# Patient Record
Sex: Male | Born: 1960 | Race: Black or African American | Hispanic: No | Smoking: Never smoker
Health system: Southern US, Community
[De-identification: ages and names within clinical notes are randomized; demographics above are authoritative.]

## PROBLEM LIST (undated history)

## (undated) DIAGNOSIS — B182 Chronic viral hepatitis C: Secondary | ICD-10-CM

## (undated) DIAGNOSIS — B9681 Helicobacter pylori [H. pylori] as the cause of diseases classified elsewhere: Secondary | ICD-10-CM

## (undated) DIAGNOSIS — K2970 Gastritis, unspecified, without bleeding: Secondary | ICD-10-CM

## (undated) DIAGNOSIS — M1990 Unspecified osteoarthritis, unspecified site: Secondary | ICD-10-CM

## (undated) DIAGNOSIS — I10 Essential (primary) hypertension: Secondary | ICD-10-CM

## (undated) DIAGNOSIS — M75100 Unspecified rotator cuff tear or rupture of unspecified shoulder, not specified as traumatic: Secondary | ICD-10-CM

## (undated) DIAGNOSIS — R52 Pain, unspecified: Secondary | ICD-10-CM

## (undated) DIAGNOSIS — M109 Gout, unspecified: Secondary | ICD-10-CM

## (undated) HISTORY — PX: KNEE SURGERY: SHX244

## (undated) HISTORY — DX: Helicobacter pylori (H. pylori) as the cause of diseases classified elsewhere: B96.81

## (undated) HISTORY — DX: Unspecified rotator cuff tear or rupture of unspecified shoulder, not specified as traumatic: M75.100

## (undated) HISTORY — DX: Gastritis, unspecified, without bleeding: K29.70

## (undated) HISTORY — DX: Chronic viral hepatitis C: B18.2

---

## 2006-10-10 ENCOUNTER — Emergency Department (HOSPITAL_COMMUNITY): Admission: EM | Admit: 2006-10-10 | Discharge: 2006-10-10 | Payer: Self-pay | Admitting: Emergency Medicine

## 2009-07-20 ENCOUNTER — Emergency Department (HOSPITAL_COMMUNITY): Admission: EM | Admit: 2009-07-20 | Discharge: 2009-07-20 | Payer: Self-pay | Admitting: Emergency Medicine
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
## 2009-07-30 ENCOUNTER — Emergency Department (HOSPITAL_COMMUNITY): Admission: EM | Admit: 2009-07-30 | Discharge: 2009-07-30 | Payer: Self-pay | Admitting: Emergency Medicine
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
## 2009-08-02 ENCOUNTER — Emergency Department (HOSPITAL_COMMUNITY): Admission: EM | Admit: 2009-08-02 | Discharge: 2009-08-02 | Payer: Self-pay | Admitting: Emergency Medicine

## 2010-12-30 ENCOUNTER — Emergency Department (HOSPITAL_COMMUNITY): Payer: No Typology Code available for payment source

## 2010-12-30 ENCOUNTER — Emergency Department (HOSPITAL_COMMUNITY)
Admission: EM | Admit: 2010-12-30 | Discharge: 2010-12-30 | Disposition: A | Discharge status: Home / Self Care | Payer: No Typology Code available for payment source | Attending: Emergency Medicine | Admitting: Emergency Medicine

## 2010-12-30 DIAGNOSIS — M545 Low back pain, unspecified: Secondary | ICD-10-CM | POA: Insufficient documentation

## 2010-12-30 DIAGNOSIS — M542 Cervicalgia: Secondary | ICD-10-CM | POA: Insufficient documentation

## 2010-12-30 DIAGNOSIS — M546 Pain in thoracic spine: Secondary | ICD-10-CM | POA: Insufficient documentation

## 2010-12-30 DIAGNOSIS — R079 Chest pain, unspecified: Secondary | ICD-10-CM | POA: Insufficient documentation

## 2010-12-30 DIAGNOSIS — S0083XA Contusion of other part of head, initial encounter: Secondary | ICD-10-CM | POA: Insufficient documentation

## 2010-12-30 DIAGNOSIS — S139XXA Sprain of joints and ligaments of unspecified parts of neck, initial encounter: Secondary | ICD-10-CM | POA: Insufficient documentation

## 2010-12-30 DIAGNOSIS — I10 Essential (primary) hypertension: Secondary | ICD-10-CM | POA: Insufficient documentation

## 2010-12-30 DIAGNOSIS — S0003XA Contusion of scalp, initial encounter: Secondary | ICD-10-CM | POA: Insufficient documentation

## 2010-12-30 DIAGNOSIS — S0180XA Unspecified open wound of other part of head, initial encounter: Secondary | ICD-10-CM | POA: Insufficient documentation

## 2010-12-30 DIAGNOSIS — IMO0002 Reserved for concepts with insufficient information to code with codable children: Secondary | ICD-10-CM | POA: Insufficient documentation
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
## 2011-01-02 ENCOUNTER — Other Ambulatory Visit (HOSPITAL_COMMUNITY): Payer: Self-pay | Admitting: Emergency Medicine

## 2011-01-02 ENCOUNTER — Emergency Department (HOSPITAL_COMMUNITY)
Admission: EM | Admit: 2011-01-02 | Discharge: 2011-01-02 | Disposition: A | Discharge status: Home / Self Care | Payer: No Typology Code available for payment source | Attending: Emergency Medicine | Admitting: Emergency Medicine

## 2011-01-02 ENCOUNTER — Ambulatory Visit (HOSPITAL_COMMUNITY)
Admission: RE | Admit: 2011-01-02 | Discharge: 2011-01-02 | Disposition: A | Discharge status: Home / Self Care | Payer: No Typology Code available for payment source | Source: Ambulatory Visit | Attending: Emergency Medicine | Admitting: Emergency Medicine

## 2011-01-02 DIAGNOSIS — I10 Essential (primary) hypertension: Secondary | ICD-10-CM | POA: Insufficient documentation

## 2011-01-02 DIAGNOSIS — M25569 Pain in unspecified knee: Secondary | ICD-10-CM
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
## 2013-09-15 ENCOUNTER — Emergency Department (HOSPITAL_COMMUNITY)
Admission: EM | Admit: 2013-09-15 | Discharge: 2013-09-15 | Disposition: A | Discharge status: Home / Self Care | Payer: No Typology Code available for payment source | Attending: Emergency Medicine | Admitting: Emergency Medicine

## 2013-09-15 ENCOUNTER — Encounter (HOSPITAL_COMMUNITY): Payer: Self-pay | Admitting: Emergency Medicine

## 2013-09-15 DIAGNOSIS — M109 Gout, unspecified: Secondary | ICD-10-CM

## 2013-09-15 HISTORY — DX: Gout, unspecified: M10.9

## 2013-09-15 MED ORDER — PREDNISONE 10 MG PO TABS
60.0000 mg | ORAL_TABLET | Freq: Once | ORAL | Status: AC
  Administered 2013-09-15: 60 mg via ORAL
  Filled 2013-09-15 (×2): qty 1

## 2013-09-15 MED ORDER — HYDROCODONE-ACETAMINOPHEN 5-325 MG PO TABS: 1.0000 | ORAL_TABLET | ORAL | Status: DC | PRN

## 2013-09-15 MED ORDER — PREDNISONE 10 MG PO TABS: ORAL_TABLET | ORAL | Status: DC

## 2013-09-15 MED ORDER — DICLOFENAC SODIUM 75 MG PO TBEC: 75.0000 mg | DELAYED_RELEASE_TABLET | Freq: Two times a day (BID) | ORAL | Status: DC

## 2013-09-15 MED ORDER — ONDANSETRON HCL 4 MG PO TABS
4.0000 mg | ORAL_TABLET | Freq: Once | ORAL | Status: AC
  Administered 2013-09-15: 4 mg via ORAL
  Filled 2013-09-15: qty 1

## 2013-09-15 MED ORDER — HYDROCODONE-ACETAMINOPHEN 5-325 MG PO TABS
2.0000 | ORAL_TABLET | Freq: Once | ORAL | Status: AC
  Administered 2013-09-15: 2 via ORAL
  Filled 2013-09-15: qty 2

## 2013-09-15 MED ORDER — KETOROLAC TROMETHAMINE 10 MG PO TABS
10.0000 mg | ORAL_TABLET | Freq: Once | ORAL | Status: AC
  Administered 2013-09-15: 10 mg via ORAL
  Filled 2013-09-15: qty 1

## 2013-09-15 NOTE — ED Provider Notes (Signed)
Medical screening examination/treatment/procedure(s) were performed by non-physician practitioner and as supervising physician I was immediately available for consultation/collaboration.  EKG Interpretation   None         Maudry Diego, MD 09/15/13 2235

## 2013-09-15 NOTE — ED Provider Notes (Signed)
CSN: [id number]     Arrival date & time 09/15/13  1846 History   First MD Initiated Contact with Patient 09/15/13 2038     Chief Complaint  Patient presents with  . Foot Pain   (Consider location/radiation/quality/duration/timing/severity/associated sxs/prior Treatment) HPI Comments: Patient is a 53 year old male who presents to the emergency department with complaint of right ankle pain. The patient states his pain started on last evening. He denies any recent injury. He has not had any recent operations or procedures involving the right ankle. It is of note that the patient has been diagnosed with gout in the past. The patient states that his entire family seems to have it, from grandfather to his current generation. Patient states the pain is getting progressively worse, he has tried to stay off of the foot and ankle today but rest and elevation have not helped the pain.  Patient is a 53 y.o. male presenting with lower extremity pain. The history is provided by the patient.  Foot Pain Associated symptoms include arthralgias. Pertinent negatives include no abdominal pain, chest pain, coughing or neck pain.    Past Medical History  Diagnosis Date  . Gout    Past Surgical History  Procedure Laterality Date  . Knee surgery      x4   History reviewed. No pertinent family history. History  Substance Use Topics  . Smoking status: Never Smoker   . Smokeless tobacco: Not on file  . Alcohol Use: Yes    Review of Systems  Constitutional: Negative for activity change.       All ROS Neg except as noted in HPI  HENT: Negative for nosebleeds.   Eyes: Negative for photophobia and discharge.  Respiratory: Negative for cough, shortness of breath and wheezing.   Cardiovascular: Negative for chest pain and palpitations.  Gastrointestinal: Negative for abdominal pain and blood in stool.  Genitourinary: Negative for dysuria, frequency and hematuria.  Musculoskeletal: Positive for arthralgias.  Negative for back pain and neck pain.  Skin: Negative.   Neurological: Negative for dizziness, seizures and speech difficulty.  Psychiatric/Behavioral: Negative for hallucinations and confusion.    Allergies  Review of patient's allergies indicates no known allergies.  Home Medications  No current outpatient prescriptions on file. BP 140/79  Pulse 111  Temp(Src) 99.6 F (37.6 C) (Oral)  Resp 20  Ht 6\' 2"  (1.88 m)  Wt 245 lb (111.131 kg)  BMI 31.44 kg/m2  SpO2 98% Physical Exam  Nursing note and vitals reviewed. Constitutional: He is oriented to person, place, and time. He appears well-developed and well-nourished.  Non-toxic appearance.  HENT:  Head: Normocephalic.  Right Ear: Tympanic membrane and external ear normal.  Left Ear: Tympanic membrane and external ear normal.  Eyes: EOM and lids are normal. Pupils are equal, round, and reactive to light.  Neck: Normal range of motion. Neck supple. Carotid bruit is not present.  Cardiovascular: Normal rate, regular rhythm, normal heart sounds, intact distal pulses and normal pulses.   Pulmonary/Chest: Breath sounds normal. No respiratory distress.  Abdominal: Soft. Bowel sounds are normal. There is no tenderness. There is no guarding.  Musculoskeletal: Normal range of motion.  There is mild-to-moderate swelling of the right ankle. There is some increased redness and warmth of the ankle as well. There is good range of motion of the right hip and knee. There is full range of motion of the toes of the right foot. There no lesions between the toes. There no lesions of the plantar surface of  the right foot. The Achilles tendon is intact.  Lymphadenopathy:       Head (right side): No submandibular adenopathy present.       Head (left side): No submandibular adenopathy present.    He has no cervical adenopathy.  Neurological: He is alert and oriented to person, place, and time. He has normal strength. No cranial nerve deficit or sensory  deficit.  Skin: Skin is warm and dry.  Psychiatric: He has a normal mood and affect. His speech is normal.    ED Course  Procedures (including critical care time) Labs Review Labs Reviewed - No data to display Imaging Review No results found.  EKG Interpretation   None       MDM  No diagnosis found. **I have reviewed nursing notes, vital signs, and all appropriate lab and imaging results for this patient.*  Patient states he has a history of gout. He also has a family history of gout. He has a flare of pain in his right ankle which he believes to be gout related. The ankle is warm but not hot. I do not find evidence of a infected joint. There no lesions noted about the foot or ankle to contribute to the idea of infection. The patient has not had injury to the area to consider trauma.  Prescription for Norco, prednisone, and diclofenac given to the patient. Patient does not have a primary physician, he is referred to the adult medicine clinic until he can establish a primary physician.  Lenox Ahr, PA-C 09/15/13 2053

## 2013-09-15 NOTE — Discharge Instructions (Signed)
Please see one of the physicians at the adult medicine clinic to assist with your medical problems until you establish your primary care physician. Please use medications as suggested. Norco may cause drowsiness, please use with caution. Please keep your foot elevated as much as possible and warm as possible. Gout Gout is when your joints become red, sore, and swell (inflammed). This is caused by the buildup of uric acid crystals in the joints. Uric acid is a chemical that is normally in the blood. If the level of uric acid gets too high in the blood, these crystals form in your joints and tissues. Over time, these crystals can form into masses near the joints and tissues. These masses can destroy bone and cause the bone to look misshapen (deformed). HOME CARE   Do not take aspirin for pain.  Only take medicine as told by your doctor.  Rest the joint as much as you can. When in bed, keep sheets and blankets off painful areas.  Keep the sore joints raised (elevated).  Put warm or cold packs on painful joints. Use of warm or cold packs depends on which works best for you.  Use crutches if the painful joint is in your leg.  Drink enough fluids to keep your pee (urine) clear or pale yellow. Limit alcohol, sugary drinks, and drinks with fructose in them.  Follow your diet instructions. Pay careful attention to how much protein you eat. Include fruits, vegetables, whole grains, and fat-free or low-fat milk products in your daily diet. Talk to your doctor or dietician about the use of coffee, vitamin C, and cherries. These may help lower uric acid levels.  Keep a healthy body weight. GET HELP RIGHT AWAY IF:   You have watery poop (diarrhea), throw up (vomit), or have any side effects from medicines.  You do not feel better in 24 hours, or you are getting worse.  Your joint becomes suddenly more tender, and you have chills or a fever. MAKE SURE YOU:   Understand these instructions.  Will  watch your condition.  Will get help right away if you are not doing well or get worse. Document Released: 05/09/2008 Document Revised: 11/25/2012 Document Reviewed: 11/08/2009 [facility] Patient Information 2014 [place].  Purine Restricted Diet A low-purine diet consists of foods that reduce uric acid made in your body. INDICATIONS FOR USE  Your caregiver may ask you to follow a low-purine diet to reduce gout flairs.  GUIDELINES  Avoid high-purine foods, including all alcohol, yeast extracts taken as supplements, and sauces made from meats (like gravy). Do not eat high-purine meats, including anchovies, sardines, herring, mussels, tuna, codfish, scallops, trout, haddock, bacon, organ meats, tripe, goose, wild game, and sweetbreads.  Grains  Allowed/Recommended: All, except those listed to consume in moderation.  Consume in Moderation: Oatmeal ( cup uncooked daily), wheat bran or germ ( cup daily), and whole grains. Vegetables  Allowed/Recommended: All, except those listed to consume in moderation.  Consume in Moderation: Asparagus, cauliflower, spinach, mushrooms, and green peas ( cup daily). Fruit  Allowed/Recommended: All.  Consume in Moderation: None. Meat and Meat Substitutes  Allowed/Recommended: Eggs, nuts, and peanut butter.  Consume in Moderation: Limit to 4 to 6 oz daily. Avoid high-purine meats. Lentils, peas, and dried beans (1 cup daily). Milk  Allowed/Recommended: All. Choose low-fat or skim when possible.  Consume in Moderation: None. Fats and Oils  Allowed/Recommended: All.  Consume in Moderation: None. Beverages  Allowed/Recommended: All, except those listed to avoid.  Avoid:  All alcohol. Condiments/Miscellaneous  Allowed/Recommended: All, except those listed to consume in moderation.  Consume in Moderation: Bouillon and meat-based broths and soups. Document Released: 11/25/2010 Document Revised: 10/23/2011 Document Reviewed:  11/25/2010 [facility] Patient Information 2014 [place].

## 2013-09-15 NOTE — ED Notes (Signed)
Pt c/o rt ankle pain since last night. Pt denies any injury but states he has a hx of gout.

## 2014-12-25 ENCOUNTER — Emergency Department (HOSPITAL_COMMUNITY)
Admission: EM | Admit: 2014-12-25 | Discharge: 2014-12-25 | Disposition: A | Discharge status: Home / Self Care | Payer: Self-pay | Attending: Emergency Medicine | Admitting: Emergency Medicine

## 2014-12-25 ENCOUNTER — Encounter (HOSPITAL_COMMUNITY): Payer: Self-pay | Admitting: Emergency Medicine

## 2014-12-25 DIAGNOSIS — Z7952 Long term (current) use of systemic steroids: Secondary | ICD-10-CM | POA: Insufficient documentation

## 2014-12-25 DIAGNOSIS — Z791 Long term (current) use of non-steroidal anti-inflammatories (NSAID): Secondary | ICD-10-CM | POA: Insufficient documentation

## 2014-12-25 DIAGNOSIS — M1000 Idiopathic gout, unspecified site: Secondary | ICD-10-CM | POA: Insufficient documentation

## 2014-12-25 MED ORDER — COLCHICINE 0.6 MG PO TABS
0.6000 mg | ORAL_TABLET | Freq: Once | ORAL | Status: AC
  Administered 2014-12-25: 0.6 mg via ORAL
  Filled 2014-12-25: qty 1

## 2014-12-25 MED ORDER — DICLOFENAC SODIUM 75 MG PO TBEC: 75.0000 mg | DELAYED_RELEASE_TABLET | Freq: Two times a day (BID) | ORAL | Status: DC

## 2014-12-25 MED ORDER — DEXAMETHASONE 4 MG PO TABS: 4.0000 mg | ORAL_TABLET | Freq: Two times a day (BID) | ORAL | Status: DC

## 2014-12-25 MED ORDER — DEXAMETHASONE SODIUM PHOSPHATE 4 MG/ML IJ SOLN
8.0000 mg | Freq: Once | INTRAMUSCULAR | Status: AC
  Administered 2014-12-25: 8 mg via INTRAMUSCULAR
  Filled 2014-12-25: qty 2

## 2014-12-25 MED ORDER — ACETAMINOPHEN-CODEINE #3 300-30 MG PO TABS: 1.0000 | ORAL_TABLET | Freq: Four times a day (QID) | ORAL | Status: DC | PRN

## 2014-12-25 MED ORDER — KETOROLAC TROMETHAMINE 10 MG PO TABS
10.0000 mg | ORAL_TABLET | Freq: Once | ORAL | Status: AC
  Administered 2014-12-25: 10 mg via ORAL
  Filled 2014-12-25: qty 1

## 2014-12-25 NOTE — ED Notes (Signed)
Reviewed discharge instructions and meds. States understanding.  RGK

## 2014-12-25 NOTE — Discharge Instructions (Signed)
Please use Decadron and diclofenac daily with food. May use Tylenol codeine for pain if needed. Please take all these medications with food. Please call [facility] here in [place] area, or the [facility] in [place] to establish her primary physician to help manage your gout attacks. Gout Gout is when your joints become red, sore, and swell (inflamed). This is caused by the buildup of uric acid crystals in the joints. Uric acid is a chemical that is normally in the blood. If the level of uric acid gets too high in the blood, these crystals form in your joints and tissues. Over time, these crystals can form into masses near the joints and tissues. These masses can destroy bone and cause the bone to look misshapen (deformed). HOME CARE   Do not take aspirin for pain.  Only take medicine as told by your doctor.  Rest the joint as much as you can. When in bed, keep sheets and blankets off painful areas.  Keep the sore joints raised (elevated).  Put warm or cold packs on painful joints. Use of warm or cold packs depends on which works best for you.  Use crutches if the painful joint is in your leg.  Drink enough fluids to keep your pee (urine) clear or pale yellow. Limit alcohol, sugary drinks, and drinks with fructose in them.  Follow your diet instructions. Pay careful attention to how much protein you eat. Include fruits, vegetables, whole grains, and fat-free or low-fat milk products in your daily diet. Talk to your doctor or dietitian about the use of coffee, vitamin C, and cherries. These may help lower uric acid levels.  Keep a healthy body weight. GET HELP RIGHT AWAY IF:   You have watery poop (diarrhea), throw up (vomit), or have any side effects from medicines.  You do not feel better in 24 hours, or you are getting worse.  Your joint becomes suddenly more tender, and you have chills or a fever. MAKE SURE YOU:   Understand these instructions.  Will watch  your condition.  Will get help right away if you are not doing well or get worse. Document Released: 05/09/2008 Document Revised: 12/15/2013 Document Reviewed: 03/13/2012 [facility] Patient Information 2015 [place], [place]. This information is not intended to replace advice given to you by your health care provider. Make sure you discuss any questions you have with your health care provider.

## 2014-12-25 NOTE — ED Provider Notes (Signed)
CSN: [id number]     Arrival date & time 12/25/14  0840 History   First MD Initiated Contact with Patient 12/25/14 [phone]     Chief Complaint  Patient presents with  . Gout     (Consider location/radiation/quality/duration/timing/severity/associated sxs/prior Treatment) Patient is a 54 y.o. male presenting with lower extremity pain. The history is provided by the patient.  Foot Pain This is a chronic problem. The current episode started yesterday. The problem occurs intermittently. The problem has been gradually worsening. Associated symptoms include arthralgias. Pertinent negatives include no weakness. The symptoms are aggravated by standing and walking. He has tried nothing for the symptoms. The treatment provided no relief.    Past Medical History  Diagnosis Date  . Gout    Past Surgical History  Procedure Laterality Date  . Knee surgery      x4   Family History  Problem Relation Age of Onset  . Alcohol abuse Father    History  Substance Use Topics  . Smoking status: Never Smoker   . Smokeless tobacco: Never Used  . Alcohol Use: 4.2 oz/week    7 Cans of beer per week    Review of Systems  Musculoskeletal: Positive for arthralgias.  Neurological: Negative for weakness.  All other systems reviewed and are negative.     Allergies  Review of patient's allergies indicates no known allergies.  Home Medications   Prior to Admission medications   Medication Sig Start Date End Date Taking? Authorizing Provider  diclofenac (VOLTAREN) 75 MG EC tablet Take 1 tablet (75 mg total) by mouth 2 (two) times daily. 09/15/13   Lily Kocher, PA-C  HYDROcodone-acetaminophen (NORCO) 5-325 MG per tablet Take 1-2 tablets by mouth every 4 (four) hours as needed for moderate pain. 09/15/13   Lily Kocher, PA-C  predniSONE (DELTASONE) 10 MG tablet 5,4,3,2,1 - take with food 09/15/13   Lily Kocher, PA-C   Ht 6\' 2"  (1.88 m)  Wt 245 lb (111.131 kg)  BMI 31.44 kg/m2 Physical Exam    Constitutional: He is oriented to person, place, and time. He appears well-developed and well-nourished.  Non-toxic appearance.  HENT:  Head: Normocephalic.  Right Ear: Tympanic membrane and external ear normal.  Left Ear: Tympanic membrane and external ear normal.  Eyes: EOM and lids are normal. Pupils are equal, round, and reactive to light.  Neck: Normal range of motion. Neck supple. Carotid bruit is not present.  Cardiovascular: Normal rate, regular rhythm, normal heart sounds, intact distal pulses and normal pulses.   Pulmonary/Chest: Breath sounds normal. No respiratory distress.  Abdominal: Soft. Bowel sounds are normal. There is no tenderness. There is no guarding.  Musculoskeletal:       Right foot: There is decreased range of motion and tenderness.       Feet:  DJD changes of right and left knees  Lymphadenopathy:       Head (right side): No submandibular adenopathy present.       Head (left side): No submandibular adenopathy present.    He has no cervical adenopathy.  Neurological: He is alert and oriented to person, place, and time. He has normal strength. No cranial nerve deficit or sensory deficit.  Skin: Skin is warm and dry.  Psychiatric: He has a normal mood and affect. His speech is normal.  Nursing note and vitals reviewed.   ED Course  Procedures (including critical care time) Labs Review Labs Reviewed - No data to display  Imaging Review No results found.   EKG  Interpretation None      MDM  There is no injury or trauma to the right lower extremity. There is no evidence of any infection. Patient has a history of gout, and states that this feels like his previous gout attacks. The patient is cautioned to watch what he eats and drinks. He is given resources to [facility], as well as the [facility] in [place] to establish a primary physician. Prescription for Tylenol codeine, Decadron, and diclofenac given to the patient.     Final diagnoses:  None    **I have reviewed nursing notes, vital signs, and all appropriate lab and imaging results for this patient.Lily Kocher, PA-C 12/25/14 1059  Fredia Sorrow, MD 12/30/14 [phone]

## 2014-12-25 NOTE — ED Notes (Signed)
Pt reports gout flare-up in his R foot.

## 2015-01-23 ENCOUNTER — Emergency Department (HOSPITAL_COMMUNITY)
Admission: EM | Admit: 2015-01-23 | Discharge: 2015-01-23 | Disposition: A | Discharge status: Home / Self Care | Payer: No Typology Code available for payment source | Attending: Emergency Medicine | Admitting: Emergency Medicine

## 2015-01-23 ENCOUNTER — Encounter (HOSPITAL_COMMUNITY): Payer: Self-pay | Admitting: *Deleted

## 2015-01-23 ENCOUNTER — Other Ambulatory Visit: Payer: Self-pay | Admitting: Orthopedic Surgery

## 2015-01-23 ENCOUNTER — Emergency Department (HOSPITAL_COMMUNITY): Payer: No Typology Code available for payment source

## 2015-01-23 DIAGNOSIS — S81842A Puncture wound with foreign body, left lower leg, initial encounter: Secondary | ICD-10-CM | POA: Insufficient documentation

## 2015-01-23 DIAGNOSIS — Y998 Other external cause status: Secondary | ICD-10-CM | POA: Diagnosis not present

## 2015-01-23 DIAGNOSIS — W3400XA Accidental discharge from unspecified firearms or gun, initial encounter: Secondary | ICD-10-CM

## 2015-01-23 DIAGNOSIS — Y92007 Garden or yard of unspecified non-institutional (private) residence as the place of occurrence of the external cause: Secondary | ICD-10-CM | POA: Diagnosis not present

## 2015-01-23 DIAGNOSIS — Z8739 Personal history of other diseases of the musculoskeletal system and connective tissue: Secondary | ICD-10-CM | POA: Diagnosis not present

## 2015-01-23 DIAGNOSIS — Y9389 Activity, other specified: Secondary | ICD-10-CM | POA: Diagnosis not present

## 2015-01-23 LAB — I-STAT CHEM 8, ED
BUN: 7 mg/dL (ref 6–20)
CHLORIDE: 107 mmol/L (ref 101–111)
Calcium, Ion: 1.11 mmol/L — ABNORMAL LOW (ref 1.12–1.23)
Creatinine, Ser: 1.7 mg/dL — ABNORMAL HIGH (ref 0.61–1.24)
Glucose, Bld: 113 mg/dL — ABNORMAL HIGH (ref 65–99)
HCT: 48 % (ref 39.0–52.0)
HEMOGLOBIN: 16.3 g/dL (ref 13.0–17.0)
POTASSIUM: 4.6 mmol/L (ref 3.5–5.1)
Sodium: 143 mmol/L (ref 135–145)
TCO2: 21 mmol/L (ref 0–100)
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
## 2015-01-23 MED ORDER — HYDROCODONE-ACETAMINOPHEN 7.5-325 MG PO TABS: 1.0000 | ORAL_TABLET | Freq: Four times a day (QID) | ORAL | Status: DC | PRN

## 2015-01-23 MED ORDER — CEPHALEXIN 500 MG PO CAPS: 500.0000 mg | ORAL_CAPSULE | Freq: Three times a day (TID) | ORAL | Status: DC

## 2015-01-23 MED ORDER — OXYCODONE-ACETAMINOPHEN 5-325 MG PO TABS
1.0000 | ORAL_TABLET | Freq: Once | ORAL | Status: AC
  Administered 2015-01-23: 1 via ORAL
  Filled 2015-01-23: qty 1

## 2015-01-23 MED ORDER — DEXTROSE 5 % IV SOLN
1.0000 g | Freq: Once | INTRAVENOUS | Status: AC
  Administered 2015-01-23: 1 g via INTRAVENOUS
  Filled 2015-01-23: qty 10

## 2015-01-23 MED ORDER — SODIUM CHLORIDE 0.9 % IV BOLUS (SEPSIS)
500.0000 mL | Freq: Once | INTRAVENOUS | Status: AC
  Administered 2015-01-23: 500 mL via INTRAVENOUS
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
## 2015-01-23 NOTE — Discharge Instructions (Signed)
Return here at 2pm to have dressing change and to see dr. Aline Brochure

## 2015-01-23 NOTE — ED Notes (Signed)
Pt alert & oriented x4, stable gait. Patient given discharge instructions, paperwork & prescription(s). Patient informed not to drive, operate any equipment & handel any important documents 4 hours after taking pain medication. Patient  instructed to stop at the registration desk to finish any additional paperwork. Patient  verbalized understanding. Pt left department in wheelchair w/ no further questions. 

## 2015-01-23 NOTE — ED Notes (Signed)
Dr Aline Brochure at bedside. Covered & wrapped leg.
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
## 2015-01-24 ENCOUNTER — Emergency Department (HOSPITAL_COMMUNITY)
Admission: EM | Admit: 2015-01-24 | Discharge: 2015-01-24 | Disposition: A | Discharge status: Home / Self Care | Payer: Self-pay | Attending: Emergency Medicine | Admitting: Emergency Medicine

## 2015-01-24 ENCOUNTER — Encounter (HOSPITAL_COMMUNITY): Payer: Self-pay | Admitting: Emergency Medicine

## 2015-01-24 DIAGNOSIS — Z5189 Encounter for other specified aftercare: Secondary | ICD-10-CM

## 2015-01-24 DIAGNOSIS — Z8739 Personal history of other diseases of the musculoskeletal system and connective tissue: Secondary | ICD-10-CM | POA: Insufficient documentation

## 2015-01-24 DIAGNOSIS — Z792 Long term (current) use of antibiotics: Secondary | ICD-10-CM | POA: Insufficient documentation

## 2015-01-24 DIAGNOSIS — Z4800 Encounter for change or removal of nonsurgical wound dressing: Secondary | ICD-10-CM | POA: Insufficient documentation

## 2015-01-24 MED ORDER — HYDROMORPHONE HCL 1 MG/ML IJ SOLN
1.0000 mg | Freq: Once | INTRAMUSCULAR | Status: DC
Start: 2015-01-24 — End: 2015-01-24
  Filled 2015-01-24: qty 1

## 2015-01-24 MED ORDER — HYDROMORPHONE HCL 1 MG/ML IJ SOLN
1.0000 mg | Freq: Once | INTRAMUSCULAR | Status: AC
  Administered 2015-01-24: 1 mg via INTRAMUSCULAR

## 2015-01-24 MED ORDER — HYDROCODONE-ACETAMINOPHEN 5-325 MG PO TABS: 1.0000 | ORAL_TABLET | ORAL | Status: DC | PRN
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
## 2015-01-24 NOTE — ED Notes (Signed)
Dr.Harrison at bedside to evaluate wounds. Packed guaze removed by MD. Wound repacked with sterile saline soaked guaze wrapped with kerlex, abd pad, and ace wrap by MD.

## 2015-01-24 NOTE — Discharge Instructions (Signed)
Keep clean and dry   Come to office at 1030 am or 330 pm
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
## 2015-01-24 NOTE — ED Notes (Signed)
Bandages removed, moderate amount of bright red blood noted on bandages. Wound packed with guaze, no active bleeding noted. Guaze not removed at this time
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
## 2015-01-26 ENCOUNTER — Ambulatory Visit (INDEPENDENT_AMBULATORY_CARE_PROVIDER_SITE_OTHER): Payer: Self-pay | Admitting: Orthopedic Surgery

## 2015-01-26 ENCOUNTER — Encounter: Payer: Self-pay | Admitting: Orthopedic Surgery

## 2015-01-26 VITALS — BP 120/85 | Ht 74.0 in | Wt 240.0 lb

## 2015-01-26 DIAGNOSIS — S81802D Unspecified open wound, left lower leg, subsequent encounter: Secondary | ICD-10-CM

## 2015-01-26 MED ORDER — HYDROCODONE-ACETAMINOPHEN 7.5-325 MG PO TABS: 1.0000 | ORAL_TABLET | Freq: Four times a day (QID) | ORAL | Status: DC | PRN
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
## 2015-01-26 NOTE — Patient Instructions (Signed)
Application of wound vac left leg

## 2015-01-27 MED FILL — Hydrocodone-Acetaminophen Tab 5-325 MG: ORAL | Qty: 6 | Status: AC

## 2015-01-27 NOTE — Patient Instructions (Addendum)
    Tyler Hart  01/27/2015        Your procedure is scheduled on 02/01/2015  Report to [place] Entrance at 6:15  A.M.  Call this number if you have problems the morning of surgery:  [phone]   Remember:  Do not eat food or drink liquids after midnight.  Take these medicines the morning of surgery with A SIP OF WATER Hydrocodone   Do not wear jewelry, make-up or nail polish.  Do not wear lotions, powders, or perfumes.  You may wear deodorant.  Do not shave 48 hours prior to surgery.  Men may shave face and neck.  Do not bring valuables to the hospital.  [facility] is not responsible for any belongings or valuables.  Contacts, dentures or bridgework may not be worn into surgery.  Leave your suitcase in the car.  After surgery it may be brought to your room.  For patients admitted to the hospital, discharge time will be determined by your treatment team.  Patients discharged the day of surgery will not be allowed to drive home.    Please read over the following fact sheets that you were given. Anesthesia Post-op Instructions      PATIENT INSTRUCTIONS POST-ANESTHESIA  IMMEDIATELY FOLLOWING SURGERY:  Do not drive or operate machinery for the first twenty four hours after surgery.  Do not make any important decisions for twenty four hours after surgery or while taking narcotic pain medications or sedatives.  If you develop intractable nausea and vomiting or a severe headache please notify your doctor immediately.  FOLLOW-UP:  Please make an appointment with your surgeon as instructed. You do not need to follow up with anesthesia unless specifically instructed to do so.  WOUND CARE INSTRUCTIONS (if applicable):  Keep a dry clean dressing on the anesthesia/puncture wound site if there is drainage.  Once the wound has quit draining you may leave it open to air.  Generally you should leave the bandage intact for twenty four hours unless there is drainage.  If the  epidural site drains for more than 36-48 hours please call the anesthesia department.  QUESTIONS?:  Please feel free to call your physician or the hospital operator if you have any questions, and they will be happy to assist you.

## 2015-01-28 ENCOUNTER — Encounter (HOSPITAL_COMMUNITY)
Admission: RE | Admit: 2015-01-28 | Discharge: 2015-01-28 | Disposition: A | Discharge status: Home / Self Care | Payer: No Typology Code available for payment source | Source: Ambulatory Visit | Attending: Orthopedic Surgery | Admitting: Orthopedic Surgery

## 2015-01-28 ENCOUNTER — Encounter (HOSPITAL_COMMUNITY): Payer: Self-pay

## 2015-01-28 DIAGNOSIS — S81839A Puncture wound without foreign body, unspecified lower leg, initial encounter: Secondary | ICD-10-CM | POA: Diagnosis not present

## 2015-01-28 DIAGNOSIS — Z01818 Encounter for other preprocedural examination: Secondary | ICD-10-CM | POA: Diagnosis not present

## 2015-01-28 LAB — BASIC METABOLIC PANEL
Anion gap: 7 (ref 5–15)
BUN: 10 mg/dL (ref 6–20)
CALCIUM: 9.5 mg/dL (ref 8.9–10.3)
CO2: 25 mmol/L (ref 22–32)
Chloride: 102 mmol/L (ref 101–111)
Creatinine, Ser: 0.94 mg/dL (ref 0.61–1.24)
GFR calc Af Amer: >60 mL/min (ref >60)
Glucose, Bld: 102 mg/dL — ABNORMAL HIGH (ref 65–99)
Potassium: 4.1 mmol/L (ref 3.5–5.1)
SODIUM: 134 mmol/L — AB (ref 135–145)

## 2015-01-28 LAB — CBC
HEMATOCRIT: 44.2 % (ref 39.0–52.0)
HEMOGLOBIN: 15.1 g/dL (ref 13.0–17.0)
MCH: 32.4 pg (ref 26.0–34.0)
MCHC: 34.2 g/dL (ref 30.0–36.0)
MCV: 94.8 fL (ref 78.0–100.0)
PLATELETS: 236 10*3/uL (ref 150–400)
RBC: 4.66 MIL/uL (ref 4.22–5.81)
RDW: 13.4 % (ref 11.5–15.5)
WBC: 9.6 10*3/uL (ref 4.0–10.5)
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
## 2015-02-01 ENCOUNTER — Ambulatory Visit (HOSPITAL_COMMUNITY): Payer: No Typology Code available for payment source | Admitting: Anesthesiology

## 2015-02-01 ENCOUNTER — Encounter (HOSPITAL_COMMUNITY)
Admission: RE | Disposition: A | Discharge status: Home / Self Care | Payer: Self-pay | Source: Ambulatory Visit | Attending: Orthopedic Surgery

## 2015-02-01 ENCOUNTER — Observation Stay (HOSPITAL_COMMUNITY)
Admission: RE | Admit: 2015-02-01 | Discharge: 2015-02-02 | Disposition: A | Discharge status: Home / Self Care | Payer: No Typology Code available for payment source | Source: Ambulatory Visit | Attending: Orthopedic Surgery | Admitting: Orthopedic Surgery

## 2015-02-01 ENCOUNTER — Encounter (HOSPITAL_COMMUNITY): Payer: Self-pay | Admitting: *Deleted

## 2015-02-01 DIAGNOSIS — S81802D Unspecified open wound, left lower leg, subsequent encounter: Secondary | ICD-10-CM | POA: Diagnosis present

## 2015-02-01 DIAGNOSIS — Y929 Unspecified place or not applicable: Secondary | ICD-10-CM | POA: Insufficient documentation

## 2015-02-01 DIAGNOSIS — S81832D Puncture wound without foreign body, left lower leg, subsequent encounter: Secondary | ICD-10-CM

## 2015-02-01 DIAGNOSIS — M109 Gout, unspecified: Secondary | ICD-10-CM | POA: Diagnosis not present

## 2015-02-01 DIAGNOSIS — S81839A Puncture wound without foreign body, unspecified lower leg, initial encounter: Secondary | ICD-10-CM | POA: Insufficient documentation

## 2015-02-01 HISTORY — PX: INCISION AND DRAINAGE OF WOUND: SHX1803

## 2015-02-01 SURGERY — IRRIGATION AND DEBRIDEMENT WOUND
Anesthesia: General | Site: Leg Lower | Laterality: Left

## 2015-02-01 MED ORDER — CEFAZOLIN SODIUM-DEXTROSE 2-3 GM-% IV SOLR
INTRAVENOUS | Status: AC
  Filled 2015-02-01: qty 50

## 2015-02-01 MED ORDER — PROPOFOL 10 MG/ML IV BOLUS
INTRAVENOUS | Status: DC | PRN
  Administered 2015-02-01: 50 mg via INTRAVENOUS
  Administered 2015-02-01: 150 mg via INTRAVENOUS

## 2015-02-01 MED ORDER — MIDAZOLAM HCL 2 MG/2ML IJ SOLN
1.0000 mg | INTRAMUSCULAR | Status: DC | PRN
  Administered 2015-02-01: 2 mg via INTRAVENOUS

## 2015-02-01 MED ORDER — LIDOCAINE HCL (PF) 1 % IJ SOLN
INTRAMUSCULAR | Status: AC
  Filled 2015-02-01: qty 5

## 2015-02-01 MED ORDER — FENTANYL CITRATE (PF) 100 MCG/2ML IJ SOLN
25.0000 ug | INTRAMUSCULAR | Status: AC
  Administered 2015-02-01 (×2): 25 ug via INTRAVENOUS

## 2015-02-01 MED ORDER — FENTANYL CITRATE (PF) 100 MCG/2ML IJ SOLN
INTRAMUSCULAR | Status: AC
  Filled 2015-02-01: qty 2

## 2015-02-01 MED ORDER — SUCCINYLCHOLINE CHLORIDE 20 MG/ML IJ SOLN
INTRAMUSCULAR | Status: AC
  Filled 2015-02-01: qty 1

## 2015-02-01 MED ORDER — SODIUM CHLORIDE 0.9 % IR SOLN
Status: DC | PRN
  Administered 2015-02-01: 3000 mL

## 2015-02-01 MED ORDER — CEPHALEXIN 500 MG PO CAPS
500.0000 mg | ORAL_CAPSULE | Freq: Three times a day (TID) | ORAL | Status: DC
  Administered 2015-02-01 – 2015-02-02 (×4): 500 mg via ORAL
  Filled 2015-02-01 (×4): qty 1

## 2015-02-01 MED ORDER — CHLORHEXIDINE GLUCONATE 4 % EX LIQD: 60.0000 mL | Freq: Once | CUTANEOUS | Status: DC

## 2015-02-01 MED ORDER — HYDROCODONE-ACETAMINOPHEN 7.5-325 MG PO TABS
1.0000 | ORAL_TABLET | Freq: Four times a day (QID) | ORAL | Status: DC | PRN
  Administered 2015-02-01 – 2015-02-02 (×4): 1 via ORAL
  Filled 2015-02-01 (×4): qty 1

## 2015-02-01 MED ORDER — CEFAZOLIN SODIUM-DEXTROSE 2-3 GM-% IV SOLR
2.0000 g | INTRAVENOUS | Status: AC
  Administered 2015-02-01: 2 g via INTRAVENOUS

## 2015-02-01 MED ORDER — PROPOFOL 10 MG/ML IV BOLUS
INTRAVENOUS | Status: AC
  Filled 2015-02-01: qty 20

## 2015-02-01 MED ORDER — FENTANYL CITRATE (PF) 100 MCG/2ML IJ SOLN
25.0000 ug | INTRAMUSCULAR | Status: DC | PRN
  Administered 2015-02-01 (×2): 50 ug via INTRAVENOUS
  Filled 2015-02-01: qty 2

## 2015-02-01 MED ORDER — ONDANSETRON HCL 4 MG/2ML IJ SOLN
INTRAMUSCULAR | Status: AC
  Filled 2015-02-01: qty 2

## 2015-02-01 MED ORDER — FENTANYL CITRATE (PF) 250 MCG/5ML IJ SOLN
INTRAMUSCULAR | Status: AC
  Filled 2015-02-01: qty 5

## 2015-02-01 MED ORDER — EPHEDRINE SULFATE 50 MG/ML IJ SOLN
INTRAMUSCULAR | Status: AC
  Filled 2015-02-01: qty 1

## 2015-02-01 MED ORDER — SODIUM CHLORIDE 0.9 % IJ SOLN
INTRAMUSCULAR | Status: AC
  Filled 2015-02-01: qty 10

## 2015-02-01 MED ORDER — ONDANSETRON HCL 4 MG/2ML IJ SOLN: 4.0000 mg | Freq: Once | INTRAMUSCULAR | Status: DC | PRN

## 2015-02-01 MED ORDER — MIDAZOLAM HCL 2 MG/2ML IJ SOLN
INTRAMUSCULAR | Status: AC
  Filled 2015-02-01: qty 2

## 2015-02-01 MED ORDER — LIDOCAINE HCL (CARDIAC) 20 MG/ML IV SOLN
INTRAVENOUS | Status: DC | PRN
  Administered 2015-02-01: 50 mg via INTRAVENOUS

## 2015-02-01 MED ORDER — ONDANSETRON HCL 4 MG/2ML IJ SOLN
4.0000 mg | Freq: Once | INTRAMUSCULAR | Status: AC
  Administered 2015-02-01: 4 mg via INTRAVENOUS

## 2015-02-01 MED ORDER — LACTATED RINGERS IV SOLN
INTRAVENOUS | Status: DC
  Administered 2015-02-01: 07:00:00 via INTRAVENOUS

## 2015-02-01 MED ORDER — FENTANYL CITRATE (PF) 100 MCG/2ML IJ SOLN
INTRAMUSCULAR | Status: DC | PRN
  Administered 2015-02-01: 25 ug via INTRAVENOUS
  Administered 2015-02-01: 50 ug via INTRAVENOUS
  Administered 2015-02-01: 25 ug via INTRAVENOUS
  Administered 2015-02-01: 50 ug via INTRAVENOUS
  Administered 2015-02-01 (×2): 25 ug via INTRAVENOUS

## 2015-02-01 MED ORDER — 0.9 % SODIUM CHLORIDE (POUR BTL) OPTIME
TOPICAL | Status: DC | PRN
  Administered 2015-02-01: 1000 mL

## 2015-02-01 SURGICAL SUPPLY — 47 items
BAG HAMPER (MISCELLANEOUS) ×3 IMPLANT
BANDAGE ELASTIC 4 VELCRO NS (GAUZE/BANDAGES/DRESSINGS) ×3 IMPLANT
BANDAGE ELASTIC 4 VELCRO ST LF (GAUZE/BANDAGES/DRESSINGS) ×3 IMPLANT
BANDAGE ELASTIC 6 VELCRO NS (GAUZE/BANDAGES/DRESSINGS) IMPLANT
BANDAGE ESMARK 4X12 BL STRL LF (DISPOSABLE) ×1 IMPLANT
BNDG COHESIVE 4X5 TAN STRL (GAUZE/BANDAGES/DRESSINGS) IMPLANT
BNDG ESMARK 4X12 BLUE STRL LF (DISPOSABLE) ×3
CANISTER WOUND CARE 500ML ATS (WOUND CARE) ×3 IMPLANT
CHLORAPREP W/TINT 26ML (MISCELLANEOUS) IMPLANT
CLOTH BEACON ORANGE TIMEOUT ST (SAFETY) ×3 IMPLANT
COVER LIGHT HANDLE STERIS (MISCELLANEOUS) ×6 IMPLANT
CUFF TOURNIQUET SINGLE 18IN (TOURNIQUET CUFF) IMPLANT
CUFF TOURNIQUET SINGLE 24IN (TOURNIQUET CUFF) ×3 IMPLANT
DRAPE INCISE IOBAN 44X35 STRL (DRAPES) IMPLANT
DRESSING GELFORM 100 (GAUZE/BANDAGES/DRESSINGS) IMPLANT
DRSG VAC ATS SM SENSATRAC (GAUZE/BANDAGES/DRESSINGS) ×6 IMPLANT
FORMALIN 10 PREFIL 480ML (MISCELLANEOUS) IMPLANT
GAUZE PACKING IODOFORM 1 (PACKING) IMPLANT
GAUZE XEROFORM 5X9 LF (GAUZE/BANDAGES/DRESSINGS) IMPLANT
GLOVE SKINSENSE NS SZ8.0 LF (GLOVE) ×2
GLOVE SKINSENSE STRL SZ8.0 LF (GLOVE) ×1 IMPLANT
GLOVE SS N UNI LF 8.5 STRL (GLOVE) ×3 IMPLANT
GOWN STRL REUS W/TWL LRG LVL3 (GOWN DISPOSABLE) ×6 IMPLANT
GOWN STRL REUS W/TWL XL LVL3 (GOWN DISPOSABLE) ×3 IMPLANT
HANDPIECE INTERPULSE COAX TIP (DISPOSABLE) ×2
IV NS IRRIG 3000ML ARTHROMATIC (IV SOLUTION) ×3 IMPLANT
KIT ROOM TURNOVER APOR (KITS) ×3 IMPLANT
MANIFOLD NEPTUNE II (INSTRUMENTS) ×3 IMPLANT
NS IRRIG 1000ML POUR BTL (IV SOLUTION) ×3 IMPLANT
PACK BASIC LIMB (CUSTOM PROCEDURE TRAY) ×3 IMPLANT
PAD ABD 5X9 TENDERSORB (GAUZE/BANDAGES/DRESSINGS) IMPLANT
PAD ARMBOARD 7.5X6 YLW CONV (MISCELLANEOUS) ×3 IMPLANT
PAD CAST 4YDX4 CTTN HI CHSV (CAST SUPPLIES) IMPLANT
PADDING CAST COTTON 4X4 STRL (CAST SUPPLIES)
SET BASIN LINEN APH (SET/KITS/TRAYS/PACK) ×3 IMPLANT
SET HNDPC FAN SPRY TIP SCT (DISPOSABLE) ×1 IMPLANT
SPONGE LAP 18X18 X RAY DECT (DISPOSABLE) ×6 IMPLANT
STAPLER VISISTAT 35W (STAPLE) IMPLANT
SUT ETHILON 3 0 FSL (SUTURE) ×3 IMPLANT
SUT MON AB 2-0 SH 27 (SUTURE)
SUT MON AB 2-0 SH27 (SUTURE) IMPLANT
SUT MON AB 3-0 SH 27 (SUTURE) IMPLANT
SUT VIC AB 1 CT1 27 (SUTURE)
SUT VIC AB 1 CT1 27XBRD ANTBC (SUTURE) IMPLANT
SYR BULB IRRIGATION 50ML (SYRINGE) ×3 IMPLANT
TUBE ANAEROBIC PORT A CUL  W/M (MISCELLANEOUS) IMPLANT
YANKAUER SUCT BULB TIP 10FT TU (MISCELLANEOUS) IMPLANT

## 2015-02-01 NOTE — Care Management Note (Signed)
Case Management Note  Patient Details  Name: Tyler Hart MRN: [id number] Date of Birth: 03-14-1961  Expected Discharge Date:                  Expected Discharge Plan:  [place]  In-House Referral:  Financial Counselor  Discharge planning Services  CM Consult  Post Acute Care Choice:  Durable Medical Equipment, Home Health Choice offered to:  Patient  DME Arranged:  Negative pressure wound device DME Agency:  [place]:  RN [facility],The Agency:  [place]  Status of Service:  In process, will continue to follow  Medicare Important Message Given:    Date Medicare IM Given:    Medicare IM give by:    Date Additional Medicare IM Given:    Additional Medicare Important Message give by:     If discussed at [place] of Stay Meetings, dates discussed:    Additional Comments: Pt is form home and independent with ADL's at baseline. Pt plans to return home with [facility] services and wound vac. Pt is uninsured and has been referred to the Development worker, community. HH and DME will be delivered through [facility]. Romualdo Bolk and Ernestina Columbia have been notified of referral needs and will obtain pt info from chart. Pt plans to discharge home tomorrow. Will cont to follow.  Sherald Barge, RN 02/01/2015, 4:22 PM
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
## 2015-02-01 NOTE — Anesthesia Preprocedure Evaluation (Signed)
Anesthesia Evaluation  Patient identified by MRN, date of birth, ID band Patient awake    Reviewed: Allergy & Precautions, NPO status , Patient's Chart, lab work & pertinent test results  Airway Mallampati: II  TM Distance: >3 FB     Dental  (+) Teeth Intact   Pulmonary neg pulmonary ROS,  breath sounds clear to auscultation        Cardiovascular negative cardio ROS  Rhythm:Regular Rate:Normal     Neuro/Psych    GI/Hepatic negative GI ROS,   Endo/Other    Renal/GU      Musculoskeletal   Abdominal   Peds  Hematology   Anesthesia Other Findings   Reproductive/Obstetrics                             Anesthesia Physical Anesthesia Plan  ASA: I  Anesthesia Plan: General   Post-op Pain Management:    Induction: Intravenous  Airway Management Planned: LMA  Additional Equipment:   Intra-op Plan:   Post-operative Plan: Extubation in OR  Informed Consent: I have reviewed the patients History and Physical, chart, labs and discussed the procedure including the risks, benefits and alternatives for the proposed anesthesia with the patient or authorized representative who has indicated his/her understanding and acceptance.   Dental advisory given  Plan Discussed with:   Anesthesia Plan Comments:         Anesthesia Quick Evaluation

## 2015-02-01 NOTE — Anesthesia Procedure Notes (Signed)
Procedure Name: LMA Insertion Date/Time: 02/01/2015 7:42 AM Performed by: Andree Elk, AMY A Pre-anesthesia Checklist: Patient identified, Timeout performed, Emergency Drugs available, Suction available and Patient being monitored Patient Re-evaluated:Patient Re-evaluated prior to inductionOxygen Delivery Method: Circle system utilized Preoxygenation: Pre-oxygenation with 100% oxygen Intubation Type: IV induction Ventilation: Mask ventilation without difficulty LMA: LMA inserted LMA Size: 5.0 Number of attempts: 1 Placement Confirmation: positive ETCO2 and breath sounds checked- equal and bilateral Tube secured with: Tape Dental Injury: Teeth and Oropharynx as per pre-operative assessment
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
## 2015-02-02 ENCOUNTER — Telehealth: Payer: Self-pay | Admitting: Orthopedic Surgery

## 2015-02-02 ENCOUNTER — Encounter (HOSPITAL_COMMUNITY): Payer: Self-pay | Admitting: Orthopedic Surgery

## 2015-02-02 DIAGNOSIS — S81802D Unspecified open wound, left lower leg, subsequent encounter: Secondary | ICD-10-CM | POA: Diagnosis not present

## 2015-02-02 MED ORDER — CEPHALEXIN 500 MG PO CAPS: 500.0000 mg | ORAL_CAPSULE | Freq: Three times a day (TID) | ORAL | Status: DC

## 2015-02-02 MED ORDER — HYDROCODONE-ACETAMINOPHEN 7.5-325 MG PO TABS: 1.0000 | ORAL_TABLET | ORAL | Status: DC | PRN

## 2015-02-02 NOTE — Telephone Encounter (Signed)
Tyler Hart states they will try again tomorrow

## 2015-02-02 NOTE — Progress Notes (Signed)
Tyler Hart discharged home per MD order.  Discharge instructions reviewed and discussed with the patient, all questions and concerns answered. Copy of instructions and scripts given to patient.    Medication List    TAKE these medications        cephALEXin 500 MG capsule  Commonly known as:  KEFLEX  Take 1 capsule (500 mg total) by mouth 3 (three) times daily.     HYDROcodone-acetaminophen 7.5-325 MG per tablet  Commonly known as:  NORCO  Take 1 tablet by mouth every 4 (four) hours as needed for moderate pain.         IV site discontinued and catheter remains intact. Site without signs and symptoms of complications. Dressing and pressure applied.  Patient escorted to car by Letta Median, NT in a wheelchair,  no distress noted upon discharge.  Regino Bellow 02/02/2015 11:54 AM

## 2015-02-02 NOTE — Telephone Encounter (Signed)
Routing to Dr Harrison 
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
## 2015-02-02 NOTE — Care Management Note (Signed)
Case Management Note  Patient Details  Name: Tyler Hart MRN: [id number] Date of Birth: 1960-12-24  Expected Discharge Date:                  Expected Discharge Plan:  [place]  In-House Referral:  Financial Counselor  Discharge planning Services  CM Consult  Post Acute Care Choice:  Durable Medical Equipment, Home Health Choice offered to:  Patient  DME Arranged:  Negative pressure wound device DME Agency:  [place]:  RN [facility] Agency:  [place]  Status of Service:  Completed, signed off  Medicare Important Message Given:    Date Medicare IM Given:    Medicare IM give by:    Date Additional Medicare IM Given:    Additional Medicare Important Message give by:     If discussed at [place] of Stay Meetings, dates discussed:    Additional Comments: Pt is discharging home today with [facility] services. [facility] is aware of discharge and will make first visit today to place wound vac. No further CM needs.  Sherald Barge, RN 02/02/2015, 11:40 AM

## 2015-02-02 NOTE — Telephone Encounter (Signed)
Patient is calling asking how long will he have to remain out of work, he will need a note, please advise?

## 2015-02-02 NOTE — Telephone Encounter (Signed)
Call received from [place], Aletta Edouard; states patient declined the home visit and wound vac treatment scheduled for today, 02/02/15, due to not feeling up to it.  Her direct ph# is [phone].  Patient's post op #1 appointment is scheduled Monday, 02/08/15.
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
## 2015-02-04 ENCOUNTER — Telehealth: Payer: Self-pay | Admitting: Orthopedic Surgery

## 2015-02-04 NOTE — Telephone Encounter (Signed)
Lucky from [facility] needs An order on Mr. Grider  for the social worker since he is self pay (charity case)

## 2015-02-04 NOTE — Telephone Encounter (Signed)
Give verbal ok

## 2015-02-04 NOTE — Telephone Encounter (Signed)
Verbal order given  

## 2015-02-04 NOTE — Telephone Encounter (Signed)
???????    SURE

## 2015-02-08 ENCOUNTER — Ambulatory Visit (INDEPENDENT_AMBULATORY_CARE_PROVIDER_SITE_OTHER): Payer: Self-pay | Admitting: Orthopedic Surgery

## 2015-02-08 ENCOUNTER — Encounter: Payer: Self-pay | Admitting: Orthopedic Surgery

## 2015-02-08 VITALS — BP 121/78 | Ht 74.0 in | Wt 240.0 lb

## 2015-02-08 DIAGNOSIS — S81802D Unspecified open wound, left lower leg, subsequent encounter: Secondary | ICD-10-CM

## 2015-02-08 MED ORDER — HYDROCODONE-ACETAMINOPHEN 7.5-325 MG PO TABS: 1.0000 | ORAL_TABLET | ORAL | Status: DC | PRN
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
## 2015-02-08 NOTE — Patient Instructions (Signed)
WORK NOTE OUT OF WORK 2 WEEKS

## 2015-02-10 ENCOUNTER — Telehealth: Payer: Self-pay | Admitting: *Deleted
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
## 2015-02-11 NOTE — Telephone Encounter (Signed)
Returned call, no answer.

## 2015-02-11 NOTE — Telephone Encounter (Signed)
We can check when he comes in

## 2015-02-16 ENCOUNTER — Ambulatory Visit (INDEPENDENT_AMBULATORY_CARE_PROVIDER_SITE_OTHER): Payer: Self-pay | Admitting: Orthopedic Surgery

## 2015-02-16 VITALS — BP 121/81 | Ht 74.0 in | Wt 219.0 lb

## 2015-02-16 DIAGNOSIS — S81802D Unspecified open wound, left lower leg, subsequent encounter: Secondary | ICD-10-CM

## 2015-02-16 MED ORDER — HYDROCODONE-ACETAMINOPHEN 7.5-325 MG PO TABS: 1.0000 | ORAL_TABLET | ORAL | Status: DC | PRN

## 2015-02-16 NOTE — Telephone Encounter (Signed)
Nurse was advised last week
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
## 2015-02-17 ENCOUNTER — Encounter: Payer: Self-pay | Admitting: *Deleted

## 2015-02-17 NOTE — Telephone Encounter (Signed)
This encounter was created in error - please disregard.

## 2015-03-04 ENCOUNTER — Telehealth: Payer: Self-pay | Admitting: *Deleted

## 2015-03-04 NOTE — Telephone Encounter (Signed)
Called nurse [name], left voicemail as she requested

## 2015-03-04 NOTE — Telephone Encounter (Signed)
[facility] nurse [name] called and states patients wound has filled in and close to surface, wound specialist recommends d/c wound vac and start calcium alginate and three times a week wound dressing  DO YOU AGREE?  Tyler Hart [phone]

## 2015-03-04 NOTE — Telephone Encounter (Signed)
Yes

## 2015-03-16 ENCOUNTER — Ambulatory Visit (INDEPENDENT_AMBULATORY_CARE_PROVIDER_SITE_OTHER): Payer: Self-pay | Admitting: Orthopedic Surgery

## 2015-03-16 ENCOUNTER — Encounter: Payer: Self-pay | Admitting: Orthopedic Surgery

## 2015-03-16 VITALS — BP 118/80 | Ht 74.0 in | Wt 219.0 lb

## 2015-03-16 DIAGNOSIS — S81802D Unspecified open wound, left lower leg, subsequent encounter: Secondary | ICD-10-CM

## 2015-03-16 MED ORDER — HYDROCODONE-ACETAMINOPHEN 7.5-325 MG PO TABS: 1.0000 | ORAL_TABLET | Freq: Three times a day (TID) | ORAL | Status: DC | PRN

## 2015-03-16 NOTE — Progress Notes (Signed)
Patient ID: Tyler Hart, male   DOB: October 05, 1960, 54 y.o.   MRN: [id number]   Patient ID: Tyler Hart, male   DOB: [birth year], 54 y.o.   MRN: [id number]  Follow up visit  Chief Complaint  Patient presents with  . Follow-up    4 week recheck on left lower leg, wound check, DOS 02-01-15.    BP 118/80 mmHg  Ht 6\' 2"  (1.88 m)  Wt 219 lb (99.338 kg)  BMI 28.11 kg/m2    5 x 4 superficial wound with excellent granulation tissue patient does complain of numbness in his foot  Patient cannot go back to work until his wound closes  Out of work 3 months  Follow-up one month  Hydrocodone 7.5 mg every 8 #90 has part of the tapering weaning process

## 2015-03-16 NOTE — Patient Instructions (Signed)
No work x 3 months

## 2015-03-31 ENCOUNTER — Telehealth: Payer: Self-pay | Admitting: *Deleted

## 2015-03-31 NOTE — Telephone Encounter (Signed)
[place] PATIENT WILL BE DISCHARGING FROM HOME HEALTH, IS CURRENTLY DOING CALCIUM ALGINATE DRESSINGS BUT WHEN RUNS OUT WILL HAVE TO PAY FOR THEM AND SHE DOES NOT THINK HE WILL BE WILLING TO DO THAT SO SHE WILL TEACH PATIENT WET TO DRY DRESSING CHANGES TO DO DAILY

## 2015-04-15 ENCOUNTER — Ambulatory Visit (INDEPENDENT_AMBULATORY_CARE_PROVIDER_SITE_OTHER): Payer: Self-pay | Admitting: Orthopedic Surgery

## 2015-04-15 VITALS — BP 131/84 | Ht 74.0 in | Wt 225.0 lb

## 2015-04-15 DIAGNOSIS — S81802D Unspecified open wound, left lower leg, subsequent encounter: Secondary | ICD-10-CM

## 2015-04-15 MED ORDER — HYDROCODONE-ACETAMINOPHEN 7.5-325 MG PO TABS: 1.0000 | ORAL_TABLET | Freq: Four times a day (QID) | ORAL | Status: DC | PRN
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
## 2015-04-15 NOTE — Patient Instructions (Signed)
Daily wet to dry dressings

## 2015-04-28 ENCOUNTER — Encounter (HOSPITAL_COMMUNITY): Payer: Self-pay

## 2015-04-28 ENCOUNTER — Emergency Department (HOSPITAL_COMMUNITY)
Admission: EM | Admit: 2015-04-28 | Discharge: 2015-04-28 | Disposition: A | Discharge status: Home / Self Care | Payer: Self-pay | Attending: Emergency Medicine | Admitting: Emergency Medicine

## 2015-04-28 DIAGNOSIS — Z792 Long term (current) use of antibiotics: Secondary | ICD-10-CM | POA: Insufficient documentation

## 2015-04-28 DIAGNOSIS — M10071 Idiopathic gout, right ankle and foot: Secondary | ICD-10-CM | POA: Insufficient documentation

## 2015-04-28 MED ORDER — INDOMETHACIN 50 MG PO CAPS: 50.0000 mg | ORAL_CAPSULE | Freq: Three times a day (TID) | ORAL | Status: DC

## 2015-04-28 MED ORDER — KETOROLAC TROMETHAMINE 30 MG/ML IJ SOLN
60.0000 mg | Freq: Once | INTRAMUSCULAR | Status: AC
Start: 2015-04-28 — End: 2015-04-28
  Administered 2015-04-28: 60 mg via INTRAMUSCULAR
  Filled 2015-04-28: qty 2

## 2015-04-28 MED ORDER — HYDROCODONE-ACETAMINOPHEN 5-325 MG PO TABS: 1.0000 | ORAL_TABLET | ORAL | Status: DC | PRN

## 2015-04-28 MED ORDER — PREDNISONE 10 MG PO TABS: 60.0000 mg | ORAL_TABLET | Freq: Every day | ORAL | Status: DC

## 2015-04-28 MED ORDER — ONDANSETRON 4 MG PO TBDP
4.0000 mg | ORAL_TABLET | Freq: Once | ORAL | Status: AC
  Administered 2015-04-28: 4 mg via ORAL
  Filled 2015-04-28: qty 1

## 2015-04-28 MED ORDER — METHYLPREDNISOLONE SODIUM SUCC 125 MG IJ SOLR
125.0000 mg | Freq: Once | INTRAMUSCULAR | Status: AC
  Administered 2015-04-28: 125 mg via INTRAMUSCULAR
  Filled 2015-04-28: qty 2

## 2015-04-28 MED ORDER — MORPHINE SULFATE (PF) 4 MG/ML IV SOLN
4.0000 mg | Freq: Once | INTRAVENOUS | Status: AC
  Administered 2015-04-28: 4 mg via INTRAMUSCULAR
  Filled 2015-04-28: qty 1
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
## 2015-04-28 NOTE — ED Notes (Signed)
C/o gout flair up to right foot

## 2015-04-28 NOTE — Discharge Instructions (Signed)

## 2015-04-28 NOTE — ED Notes (Signed)
Patient verbalizes understanding of discharge instructions, prescription medications, home care and follow up care. Patient out of department with family member.

## 2015-05-17 ENCOUNTER — Other Ambulatory Visit: Payer: Self-pay | Admitting: *Deleted

## 2015-05-17 ENCOUNTER — Ambulatory Visit (INDEPENDENT_AMBULATORY_CARE_PROVIDER_SITE_OTHER): Payer: Self-pay | Admitting: Orthopedic Surgery

## 2015-05-17 VITALS — BP 116/82 | Ht 74.0 in | Wt 230.0 lb

## 2015-05-17 DIAGNOSIS — S81802D Unspecified open wound, left lower leg, subsequent encounter: Secondary | ICD-10-CM

## 2015-05-17 DIAGNOSIS — M170 Bilateral primary osteoarthritis of knee: Secondary | ICD-10-CM

## 2015-05-17 MED ORDER — GABAPENTIN 100 MG PO CAPS: 100.0000 mg | ORAL_CAPSULE | Freq: Three times a day (TID) | ORAL | Status: DC
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
## 2015-06-04 ENCOUNTER — Encounter: Payer: Self-pay | Admitting: Orthopedic Surgery

## 2015-06-17 ENCOUNTER — Ambulatory Visit: Payer: Self-pay | Admitting: Physician Assistant

## 2015-06-28 ENCOUNTER — Ambulatory Visit (INDEPENDENT_AMBULATORY_CARE_PROVIDER_SITE_OTHER): Payer: Self-pay | Admitting: Orthopedic Surgery

## 2015-06-28 ENCOUNTER — Encounter: Payer: Self-pay | Admitting: Orthopedic Surgery

## 2015-06-28 VITALS — BP 133/96 | Ht 74.0 in | Wt 233.0 lb

## 2015-06-28 DIAGNOSIS — S81802D Unspecified open wound, left lower leg, subsequent encounter: Secondary | ICD-10-CM

## 2015-06-28 DIAGNOSIS — S8412XS Injury of peroneal nerve at lower leg level, left leg, sequela: Secondary | ICD-10-CM

## 2015-06-28 DIAGNOSIS — M170 Bilateral primary osteoarthritis of knee: Secondary | ICD-10-CM

## 2015-06-28 MED ORDER — HYDROCODONE-ACETAMINOPHEN 5-325 MG PO TABS: 1.0000 | ORAL_TABLET | Freq: Four times a day (QID) | ORAL | Status: DC | PRN
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
## 2015-07-28 ENCOUNTER — Ambulatory Visit (INDEPENDENT_AMBULATORY_CARE_PROVIDER_SITE_OTHER): Payer: Self-pay | Admitting: Orthopedic Surgery

## 2015-07-28 VITALS — BP 106/70 | Ht 74.0 in | Wt 230.0 lb

## 2015-07-28 DIAGNOSIS — S81802S Unspecified open wound, left lower leg, sequela: Secondary | ICD-10-CM

## 2015-07-28 DIAGNOSIS — M170 Bilateral primary osteoarthritis of knee: Secondary | ICD-10-CM

## 2015-07-28 MED ORDER — HYDROCODONE-ACETAMINOPHEN 5-325 MG PO TABS: 1.0000 | ORAL_TABLET | Freq: Four times a day (QID) | ORAL | Status: DC | PRN

## 2015-07-28 NOTE — Patient Instructions (Signed)
Joint Injection  Care After  Refer to this sheet in the next few days. These instructions provide you with information on caring for yourself after you have had a joint injection. Your caregiver also may give you more specific instructions. Your treatment has been planned according to current medical practices, but problems sometimes occur. Call your caregiver if you have any problems or questions after your procedure.  After any type of joint injection, it is not uncommon to experience:  · Soreness, swelling, or bruising around the injection site.  · Mild numbness, tingling, or weakness around the injection site caused by the numbing medicine used before or with the injection.  It also is possible to experience the following effects associated with the specific agent after injection:  · Iodine-based contrast agents:  ¨ Allergic reaction (itching, hives, widespread redness, and swelling beyond the injection site).  · Corticosteroids (These effects are rare.):  ¨ Allergic reaction.  ¨ Increased blood sugar levels (If you have diabetes and you notice that your blood sugar levels have increased, notify your caregiver).  ¨ Increased blood pressure levels.  ¨ Mood swings.  · Hyaluronic acid in the use of viscosupplementation.  ¨ Temporary heat or redness.  ¨ Temporary rash and itching.  ¨ Increased fluid accumulation in the injected joint.  These effects all should resolve within a day after your procedure.   HOME CARE INSTRUCTIONS  · Limit yourself to light activity the day of your procedure. Avoid lifting heavy objects, bending, stooping, or twisting.  · Take prescription or over-the-counter pain medication as directed by your caregiver.  · You may apply ice to your injection site to reduce pain and swelling the day of your procedure. Ice may be applied 03-04 times:  ¨ Put ice in a plastic bag.  ¨ Place a towel between your skin and the bag.  ¨ Leave the ice on for no longer than 15-20 minutes each time.  SEEK  IMMEDIATE MEDICAL CARE IF:   · Pain and swelling get worse rather than better or extend beyond the injection site.  · Numbness does not go away.  · Blood or fluid continues to leak from the injection site.  · You have chest pain.  · You have swelling of your face or tongue.  · You have trouble breathing or you become dizzy.  · You develop a fever, chills, or severe tenderness at the injection site that last longer than 1 day.  MAKE SURE YOU:  · Understand these instructions.  · Watch your condition.  · Get help right away if you are not doing well or if you get worse.  Document Released: 04/13/2011 Document Revised: 10/23/2011 Document Reviewed: 04/13/2011  [employer]® Patient Information ©2015 [employer]. This information is not intended to replace advice given to you by your health care provider. Make sure you discuss any questions you have with your health care provider.
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
## 2015-07-29 ENCOUNTER — Ambulatory Visit: Payer: Self-pay | Admitting: Orthopedic Surgery

## 2015-08-30 ENCOUNTER — Ambulatory Visit (INDEPENDENT_AMBULATORY_CARE_PROVIDER_SITE_OTHER): Payer: Self-pay | Admitting: Orthopedic Surgery

## 2015-08-30 VITALS — BP 132/94 | Ht 74.0 in | Wt 231.0 lb

## 2015-08-30 DIAGNOSIS — S8412XS Injury of peroneal nerve at lower leg level, left leg, sequela: Secondary | ICD-10-CM

## 2015-08-30 DIAGNOSIS — S81802S Unspecified open wound, left lower leg, sequela: Secondary | ICD-10-CM

## 2015-08-30 DIAGNOSIS — M170 Bilateral primary osteoarthritis of knee: Secondary | ICD-10-CM

## 2015-08-30 MED ORDER — HYDROCODONE-ACETAMINOPHEN 5-325 MG PO TABS: 1.0000 | ORAL_TABLET | Freq: Four times a day (QID) | ORAL | Status: DC | PRN
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
## 2015-10-04 ENCOUNTER — Encounter: Payer: Self-pay | Admitting: Orthopedic Surgery

## 2015-10-04 ENCOUNTER — Ambulatory Visit (INDEPENDENT_AMBULATORY_CARE_PROVIDER_SITE_OTHER): Payer: Self-pay | Admitting: Orthopedic Surgery

## 2015-10-04 VITALS — BP 135/92 | Ht 74.0 in | Wt 230.0 lb

## 2015-10-04 DIAGNOSIS — M170 Bilateral primary osteoarthritis of knee: Secondary | ICD-10-CM

## 2015-10-04 DIAGNOSIS — S8412XS Injury of peroneal nerve at lower leg level, left leg, sequela: Secondary | ICD-10-CM

## 2015-10-04 DIAGNOSIS — S81802S Unspecified open wound, left lower leg, sequela: Secondary | ICD-10-CM

## 2015-10-04 MED ORDER — HYDROCODONE-ACETAMINOPHEN 5-325 MG PO TABS: 1.0000 | ORAL_TABLET | Freq: Four times a day (QID) | ORAL | Status: DC | PRN

## 2015-10-04 MED ORDER — PREDNISONE 10 MG (48) PO TBPK: ORAL_TABLET | Freq: Every day | ORAL | Status: DC

## 2015-10-04 NOTE — Patient Instructions (Signed)
PREDNISONE PRESCRIPTION SENT TO [facility] [place]  PAIN MEDICATION PRESCRIPTION GIVEN IN HAND
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
## 2015-11-01 ENCOUNTER — Ambulatory Visit (INDEPENDENT_AMBULATORY_CARE_PROVIDER_SITE_OTHER): Payer: Self-pay | Admitting: Orthopedic Surgery

## 2015-11-01 VITALS — BP 124/83 | Ht 74.0 in | Wt 230.0 lb

## 2015-11-01 DIAGNOSIS — M170 Bilateral primary osteoarthritis of knee: Secondary | ICD-10-CM

## 2015-11-01 MED ORDER — HYDROCODONE-ACETAMINOPHEN 5-325 MG PO TABS: 1.0000 | ORAL_TABLET | Freq: Four times a day (QID) | ORAL | Status: DC | PRN
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
## 2015-11-29 ENCOUNTER — Telehealth: Payer: Self-pay | Admitting: *Deleted

## 2015-11-29 ENCOUNTER — Ambulatory Visit: Payer: Self-pay | Admitting: Orthopedic Surgery

## 2015-11-29 ENCOUNTER — Other Ambulatory Visit: Payer: Self-pay | Admitting: *Deleted

## 2015-11-29 MED ORDER — HYDROCODONE-ACETAMINOPHEN 5-325 MG PO TABS: 1.0000 | ORAL_TABLET | Freq: Four times a day (QID) | ORAL | Status: DC | PRN

## 2015-11-29 NOTE — Telephone Encounter (Signed)
Patient came in to pick up prescription for Hydrocodone 5/325 mg and wanted to know when you needed to see him again. His appointment for today 11/29/15 was cancelled. Please advise.

## 2015-12-27 ENCOUNTER — Ambulatory Visit (INDEPENDENT_AMBULATORY_CARE_PROVIDER_SITE_OTHER): Payer: Medicaid Other | Admitting: Orthopedic Surgery

## 2015-12-27 ENCOUNTER — Encounter: Payer: Self-pay | Admitting: Orthopedic Surgery

## 2015-12-27 VITALS — BP 122/85 | HR 85 | Ht 74.0 in | Wt 230.0 lb

## 2015-12-27 DIAGNOSIS — S8412XS Injury of peroneal nerve at lower leg level, left leg, sequela: Secondary | ICD-10-CM

## 2015-12-27 DIAGNOSIS — M170 Bilateral primary osteoarthritis of knee: Secondary | ICD-10-CM

## 2015-12-27 DIAGNOSIS — S81802D Unspecified open wound, left lower leg, subsequent encounter: Secondary | ICD-10-CM | POA: Diagnosis not present

## 2015-12-27 MED ORDER — HYDROCODONE-ACETAMINOPHEN 5-325 MG PO TABS: 1.0000 | ORAL_TABLET | Freq: Four times a day (QID) | ORAL | Status: DC | PRN
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
## 2016-01-27 ENCOUNTER — Ambulatory Visit (INDEPENDENT_AMBULATORY_CARE_PROVIDER_SITE_OTHER): Payer: Medicaid Other | Admitting: Orthopedic Surgery

## 2016-01-27 VITALS — BP 127/93 | HR 86 | Ht 74.0 in | Wt 231.0 lb

## 2016-01-27 DIAGNOSIS — Z5189 Encounter for other specified aftercare: Secondary | ICD-10-CM

## 2016-01-27 DIAGNOSIS — R52 Pain, unspecified: Secondary | ICD-10-CM

## 2016-01-27 DIAGNOSIS — M170 Bilateral primary osteoarthritis of knee: Secondary | ICD-10-CM

## 2016-01-27 MED ORDER — HYDROCODONE-ACETAMINOPHEN 5-325 MG PO TABS
1.0000 | ORAL_TABLET | Freq: Four times a day (QID) | ORAL | Status: DC | PRN
Start: 2016-01-27 — End: 2016-03-01

## 2016-01-27 NOTE — Progress Notes (Signed)
Follow-up visit chronic pain management  Patient working on getting Medicaid screening status total knees.  Still has paresthesias in the left foot  Pain medicine working fairly well  Refill medication.

## 2016-02-22 ENCOUNTER — Encounter: Payer: Self-pay | Admitting: Orthopedic Surgery

## 2016-03-01 ENCOUNTER — Ambulatory Visit (INDEPENDENT_AMBULATORY_CARE_PROVIDER_SITE_OTHER): Payer: Medicaid Other | Admitting: Orthopedic Surgery

## 2016-03-01 VITALS — BP 115/76 | HR 86 | Ht 74.0 in | Wt 233.6 lb

## 2016-03-01 DIAGNOSIS — M170 Bilateral primary osteoarthritis of knee: Secondary | ICD-10-CM | POA: Diagnosis not present

## 2016-03-01 MED ORDER — HYDROCODONE-ACETAMINOPHEN 5-325 MG PO TABS: 1.0000 | ORAL_TABLET | Freq: Four times a day (QID) | ORAL | Status: DC | PRN
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
## 2016-03-01 NOTE — Patient Instructions (Signed)
LEFT TOTAL KNEE REPLACEMENT AUGUST 3

## 2016-03-13 ENCOUNTER — Other Ambulatory Visit (HOSPITAL_COMMUNITY): Payer: Self-pay

## 2016-03-15 ENCOUNTER — Telehealth: Payer: Self-pay | Admitting: Orthopedic Surgery

## 2016-03-15 NOTE — Telephone Encounter (Signed)
Patient would like for someone to give her a call to let her know when her Pre-Op is    Please advise

## 2016-03-15 NOTE — Telephone Encounter (Signed)
Patient informed. 

## 2016-03-22 ENCOUNTER — Encounter (HOSPITAL_COMMUNITY): Payer: Self-pay | Admitting: *Deleted

## 2016-03-22 ENCOUNTER — Emergency Department (HOSPITAL_COMMUNITY)
Admission: EM | Admit: 2016-03-22 | Discharge: 2016-03-22 | Disposition: A | Discharge status: Home / Self Care | Payer: Medicaid Other | Attending: Emergency Medicine | Admitting: Emergency Medicine

## 2016-03-22 DIAGNOSIS — M10072 Idiopathic gout, left ankle and foot: Secondary | ICD-10-CM | POA: Diagnosis not present

## 2016-03-22 DIAGNOSIS — M79672 Pain in left foot: Secondary | ICD-10-CM | POA: Diagnosis present

## 2016-03-22 DIAGNOSIS — M109 Gout, unspecified: Secondary | ICD-10-CM

## 2016-03-22 MED ORDER — HYDROCODONE-ACETAMINOPHEN 5-325 MG PO TABS
1.0000 | ORAL_TABLET | Freq: Once | ORAL | Status: AC
  Administered 2016-03-22: 1 via ORAL
  Filled 2016-03-22: qty 1

## 2016-03-22 MED ORDER — HYDROCODONE-ACETAMINOPHEN 5-325 MG PO TABS: 1.0000 | ORAL_TABLET | ORAL | 0 refills | Status: DC | PRN

## 2016-03-22 MED ORDER — PREDNISONE 10 MG PO TABS: ORAL_TABLET | ORAL | 0 refills | Status: DC

## 2016-03-22 MED ORDER — DEXAMETHASONE SODIUM PHOSPHATE 10 MG/ML IJ SOLN
10.0000 mg | Freq: Once | INTRAMUSCULAR | Status: AC
  Administered 2016-03-22: 10 mg via INTRAMUSCULAR
  Filled 2016-03-22: qty 1
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
## 2016-03-22 NOTE — ED Triage Notes (Signed)
Pt c/o pain to left foot that started last night, denies any injury, has hx of gout
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
## 2016-03-27 ENCOUNTER — Ambulatory Visit (INDEPENDENT_AMBULATORY_CARE_PROVIDER_SITE_OTHER): Payer: Medicaid Other | Admitting: Orthopedic Surgery

## 2016-03-27 ENCOUNTER — Encounter: Payer: Self-pay | Admitting: Orthopedic Surgery

## 2016-03-27 VITALS — BP 136/91 | HR 69 | Temp 97.2°F | Ht 74.0 in | Wt 231.6 lb

## 2016-03-27 DIAGNOSIS — M129 Arthropathy, unspecified: Secondary | ICD-10-CM

## 2016-03-27 DIAGNOSIS — M1712 Unilateral primary osteoarthritis, left knee: Secondary | ICD-10-CM

## 2016-03-27 MED ORDER — HYDROCODONE-ACETAMINOPHEN 5-325 MG PO TABS: 1.0000 | ORAL_TABLET | ORAL | 0 refills | Status: DC | PRN

## 2016-03-27 NOTE — Progress Notes (Signed)
Preop evaluation and discussion  Patient is ready for surgery on the left knee still complains of pain. He went out of his pain medication  We gave him hydrocodone the last until the 24th  No further questions were asked Surgery will be be done on the 24th

## 2016-03-31 NOTE — Patient Instructions (Signed)
Tyler Hart  03/31/2016     @PREFPERIOPPHARMACY @   Your procedure is scheduled on  04/06/2016   Report to [facility] at  845  A.M.  Call this number if you have problems the morning of surgery:  [phone]   Remember:  Do not eat food or drink liquids after midnight.  Take these medicines the morning of surgery with A SIP OF WATER hydrocodone, prednisone.   Do not wear jewelry, make-up or nail polish.  Do not wear lotions, powders, or perfumes.  You may wear deoderant.  Do not shave 48 hours prior to surgery.  Men may shave face and neck.  Do not bring valuables to the hospital.  [facility] is not responsible for any belongings or valuables.  Contacts, dentures or bridgework may not be worn into surgery.  Leave your suitcase in the car.  After surgery it may be brought to your room.  For patients admitted to the hospital, discharge time will be determined by your treatment team.  Patients discharged the day of surgery will not be allowed to drive home.   Name and phone number of your driver:   family Special instructions:  none  Please read over the following fact sheets that you were given. Pain Booklet, Coughing and Deep Breathing, Blood Transfusion Information, Total Joint Packet, MRSA Information, Anesthesia Post-op Instructions and Care and Recovery After Surgery       Total Knee Replacement Total knee replacement is a procedure to replace your knee joint with an artificial knee joint (prosthetic knee joint). The purpose of this surgery is to reduce pain and improve your knee function. LET [facility] CARE PROVIDER KNOW ABOUT:   Any allergies you have.  All medicines you are taking, including vitamins, herbs, eye drops, creams, and over-the-counter medicines.  Previous problems you or members of your family have had with the use of anesthetics.  Any blood disorders you have.  Previous surgeries you have had.  Medical conditions you  have. RISKS AND COMPLICATIONS  Generally, total knee replacement is a safe procedure. However, problems can occur, including:  Loss of range of motion of the knee or instability of the knee.  Loosening of the prosthesis.  Infection.  Persistent pain. BEFORE THE PROCEDURE   Plan to have someone take you home after the procedure.  Do not eat or drink anything after midnight on the night before the procedure or as directed by your health care provider.  Ask your health care provider about:  Changing or stopping your regular medicines. This is especially important if you are taking diabetes medicines or blood thinners.  Taking medicines such as aspirin and ibuprofen. These medicines can thin your blood. Do not take these medicines before your procedure if your health care provider asks you not to.  Ask your health care provider about how your surgical site will be marked or identified.  You may be given antibiotic medicines to help prevent infection. PROCEDURE   To reduce your risk of infection:  Your health care team will wash or sanitize their hands.  Your skin will be washed with soap.  An IV tube will be inserted into one of your veins. You will be given one or more of the following:  A medicine that makes you drowsy (sedative).  A medicine that makes you fall asleep (general anesthetic).  A medicine injected into your spine that numbs your body below the waist (spinal anesthetic).  A medicine to block feeling in your leg (nerve block) to help ease pain after surgery.  An incision will be made in your knee. Your surgeon will take out any damaged cartilage and bone by sawing off the damaged surfaces.  The surgeon will then put a new metal liner over the sawed-off portion of your thigh bone (femur) and a plastic liner over the sawed-off portion of one of the bones of your lower leg (tibia). This is to restore alignment and function to your knee. A plastic piece is often  used to restore the surface of your knee cap. AFTER THE PROCEDURE   You will stay in a recovery area until your medicines wear off.  You may have drainage tubes to drain excess fluid from your knee. These tubes attach to a device that removes these fluids.  Once you are awake, stable, and taking fluids well, you will be taken to your hospital room.  You may be directed to take actions to help prevent blood clots. These may include:  Walking shortly after surgery, with someone assisting you. Moving around after surgery helps to improve blood flow.  Wearing compression stockings or using different types of devices.  You will receive physical therapy as prescribed by your health care provider.   This information is not intended to replace advice given to you by your health care provider. Make sure you discuss any questions you have with your health care provider.   Document Released: 11/06/2000 Document Revised: 04/21/2015 Document Reviewed: 09/10/2011 Elsevier Interactive Patient Education 2016 [employer].  Total Knee Replacement, Care After These instructions give you information on caring for yourself after your procedure. Your doctor also may give you specific instructions. Call your doctor if you have any problems or questions after your procedure. HOME CARE  See a physical therapist as told by your doctor.  Do not take baths, swim, or use hot tubs until your doctor says that it is okay.  Take medicines as told by your doctor.  Do not lift or drive until your doctor says it is okay.  Use crutches, a walker, or a cane as told by your doctor.  If you were given a knee joint motion machine, use it as told.  Rest often, but move around as much as you can. Movement helps you to heal and helps to prevent problems.  Wear special socks (compression stockings) as told by your doctor. These socks help to prevent blood clots and lessen swelling in your legs.  Follow instructions  from your doctor about how to take care of your cut from surgery (incision). Make sure you:  Wash your hands with soap and water before you change your bandage (dressing). If you cannot use soap and water, use hand sanitizer.  Change your bandage as told by your doctor.  Leave stitches (sutures), skin glue, or skin tape (adhesive) strips in place. These may need to stay in place for 2 weeks or longer. If the tape strips get loose and curl up, you may trim the loose edges. Do not remove the strips completely unless your doctor says it is okay. GET HELP IF:  You have trouble breathing.  Your wound is red, puffy, or is getting more painful.  You have yellowish-white fluid (pus) coming from your wound.  You have a bad smell coming from your wound.  Your wound will not stop bleeding.  Your wound opens up after sutures (stitches) or staples are removed.  You have a fever. GET  HELP RIGHT AWAY IF:   You have a rash.  You have shortness of breath or chest pain.  You have pain or puffiness (swelling) in your calf or thigh.  Your ability to move your knee is decreasing rather than increasing.  Your knee pain is increasing daily.   This information is not intended to replace advice given to you by your health care provider. Make sure you discuss any questions you have with your health care provider.   Document Released: 10/23/2011 Document Revised: 04/21/2015 Document Reviewed: 10/23/2011 Elsevier Interactive Patient Education 2016 [employer] PATIENT INSTRUCTIONS POST-ANESTHESIA  IMMEDIATELY FOLLOWING SURGERY:  Do not drive or operate machinery for the first twenty four hours after surgery.  Do not make any important decisions for twenty four hours after surgery or while taking narcotic pain medications or sedatives.  If you develop intractable nausea and vomiting or a severe headache please notify your doctor immediately.  FOLLOW-UP:  Please make an appointment with your surgeon  as instructed. You do not need to follow up with anesthesia unless specifically instructed to do so.  WOUND CARE INSTRUCTIONS (if applicable):  Keep a dry clean dressing on the anesthesia/puncture wound site if there is drainage.  Once the wound has quit draining you may leave it open to air.  Generally you should leave the bandage intact for twenty four hours unless there is drainage.  If the epidural site drains for more than 36-48 hours please call the anesthesia department.  QUESTIONS?:  Please feel free to call your physician or the hospital operator if you have any questions, and they will be happy to assist you.

## 2016-04-03 ENCOUNTER — Other Ambulatory Visit (HOSPITAL_COMMUNITY): Payer: Self-pay | Admitting: Orthopedic Surgery

## 2016-04-03 ENCOUNTER — Encounter (HOSPITAL_COMMUNITY)
Admission: RE | Admit: 2016-04-03 | Discharge: 2016-04-03 | Disposition: A | Discharge status: Home / Self Care | Payer: Medicaid Other | Source: Ambulatory Visit | Attending: Orthopedic Surgery | Admitting: Orthopedic Surgery

## 2016-04-03 ENCOUNTER — Ambulatory Visit (HOSPITAL_COMMUNITY)
Admission: RE | Admit: 2016-04-03 | Discharge: 2016-04-03 | Disposition: A | Discharge status: Home / Self Care | Payer: Medicaid Other | Source: Ambulatory Visit | Attending: Orthopedic Surgery | Admitting: Orthopedic Surgery

## 2016-04-03 ENCOUNTER — Encounter (HOSPITAL_COMMUNITY): Payer: Self-pay

## 2016-04-03 DIAGNOSIS — Z01812 Encounter for preprocedural laboratory examination: Secondary | ICD-10-CM | POA: Insufficient documentation

## 2016-04-03 DIAGNOSIS — M1712 Unilateral primary osteoarthritis, left knee: Secondary | ICD-10-CM | POA: Insufficient documentation

## 2016-04-03 DIAGNOSIS — Z01818 Encounter for other preprocedural examination: Secondary | ICD-10-CM | POA: Diagnosis present

## 2016-04-03 LAB — BASIC METABOLIC PANEL
Anion gap: 6 (ref 5–15)
BUN: 12 mg/dL (ref 6–20)
CHLORIDE: 101 mmol/L (ref 101–111)
CO2: 27 mmol/L (ref 22–32)
CREATININE: 0.94 mg/dL (ref 0.61–1.24)
Calcium: 9.5 mg/dL (ref 8.9–10.3)
GFR calc Af Amer: >60 mL/min (ref >60)
GFR calc non Af Amer: >60 mL/min (ref >60)
Glucose, Bld: 75 mg/dL (ref 65–99)
Potassium: 4.4 mmol/L (ref 3.5–5.1)
Sodium: 134 mmol/L — ABNORMAL LOW (ref 135–145)

## 2016-04-03 LAB — CBC
HEMATOCRIT: 47.3 % (ref 39.0–52.0)
HEMOGLOBIN: 16.2 g/dL (ref 13.0–17.0)
MCH: 33.2 pg (ref 26.0–34.0)
MCHC: 34.2 g/dL (ref 30.0–36.0)
MCV: 96.9 fL (ref 78.0–100.0)
Platelets: 197 10*3/uL (ref 150–400)
RBC: 4.88 MIL/uL (ref 4.22–5.81)
RDW: 13.2 % (ref 11.5–15.5)
WBC: 7.6 10*3/uL (ref 4.0–10.5)

## 2016-04-03 LAB — PROTIME-INR
INR: 1.08
PROTHROMBIN TIME: 14.1 s (ref 11.4–15.2)

## 2016-04-03 LAB — SURGICAL PCR SCREEN
MRSA, PCR: NEGATIVE
STAPHYLOCOCCUS AUREUS: POSITIVE — AB

## 2016-04-03 LAB — APTT: aPTT: 30 seconds (ref 24–36)

## 2016-04-03 IMAGING — DX DG KNEE 3 VIEWS*L*
3 series · 3 of 3 positions shown · non-contrast
Comparison: Left knee series of [DATE] and left tibia and
fibula series which included the left knee [DATE].

CLINICAL DATA: Known osteoarthritis of the left knee. History of 2
previous surgical procedures. Left knee pain.

EXAM:
LEFT KNEE - 3 VIEW

[knee ap]
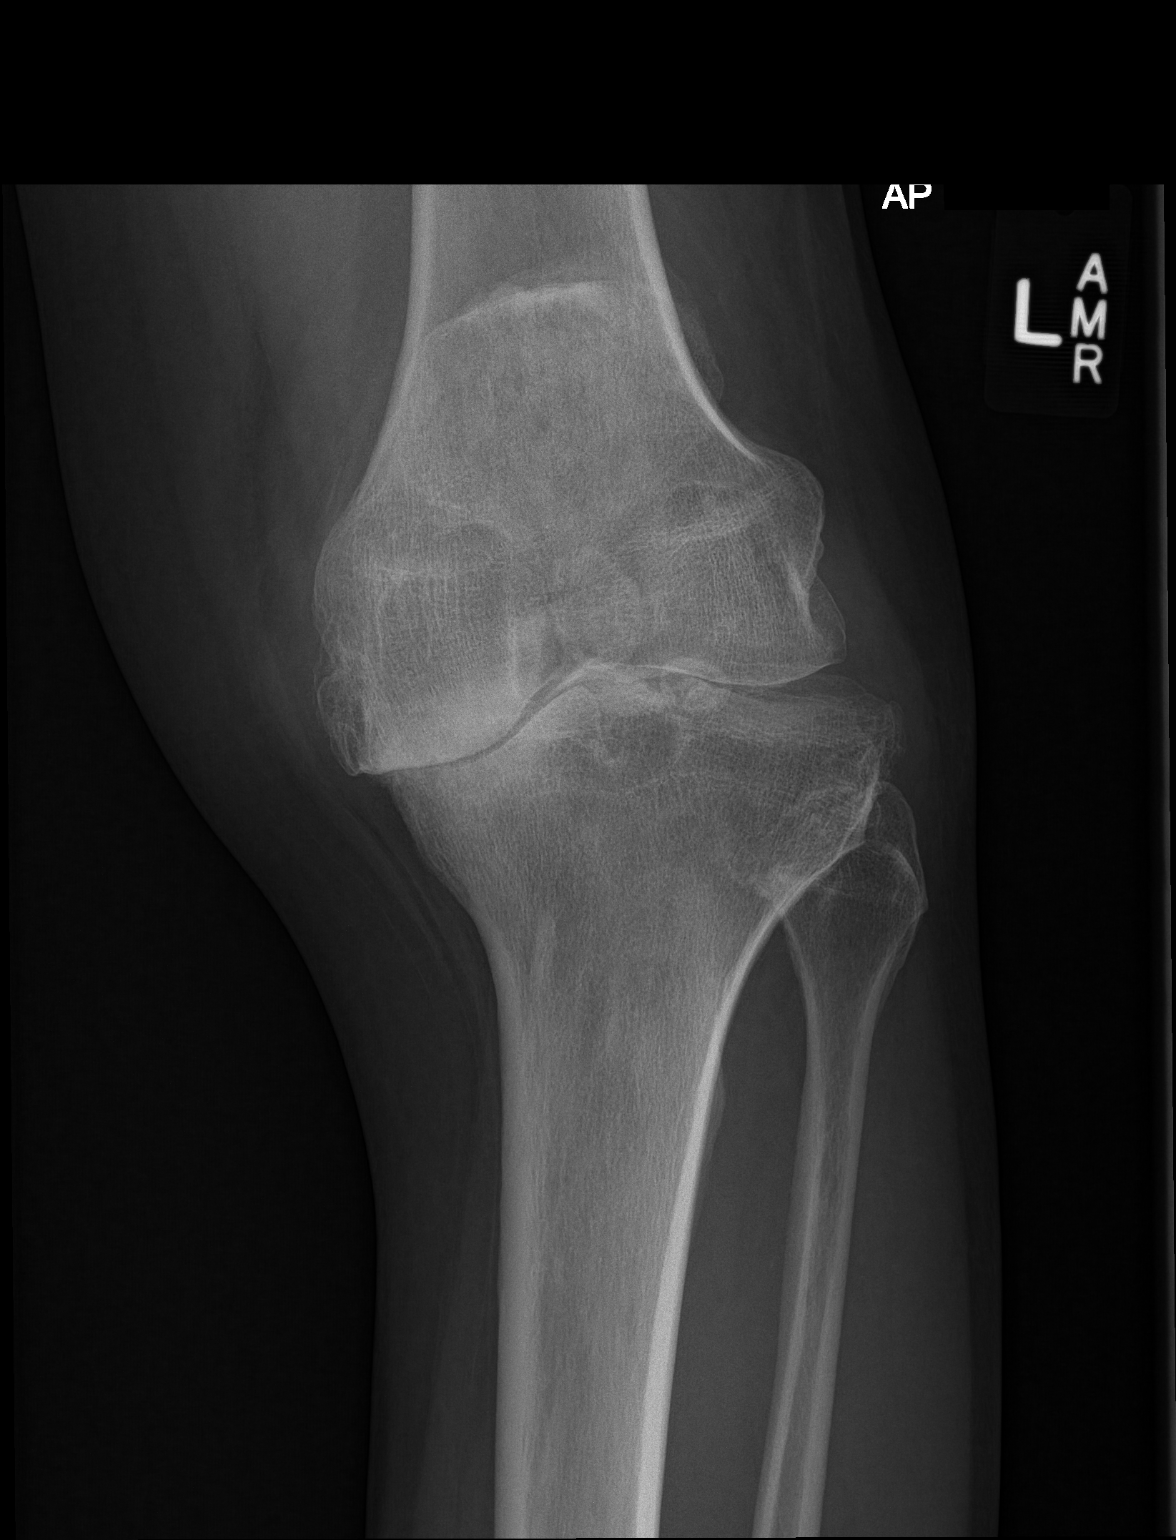

[knee lat]
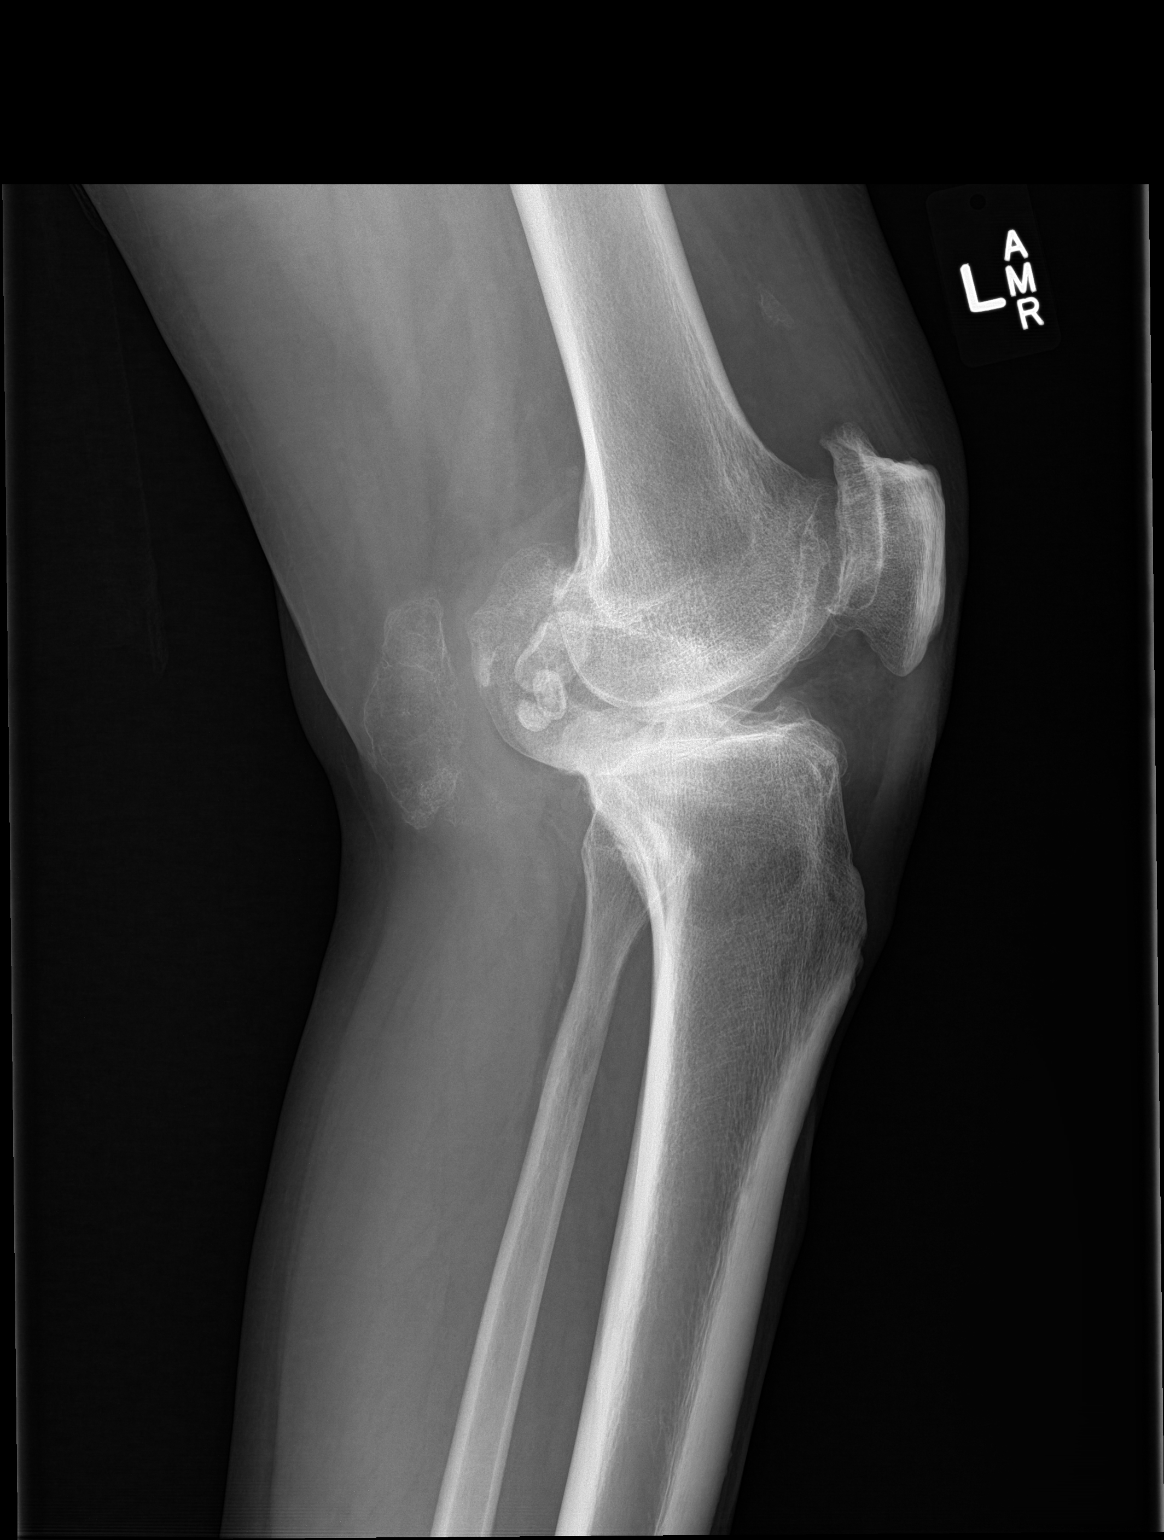

[patella skyline]
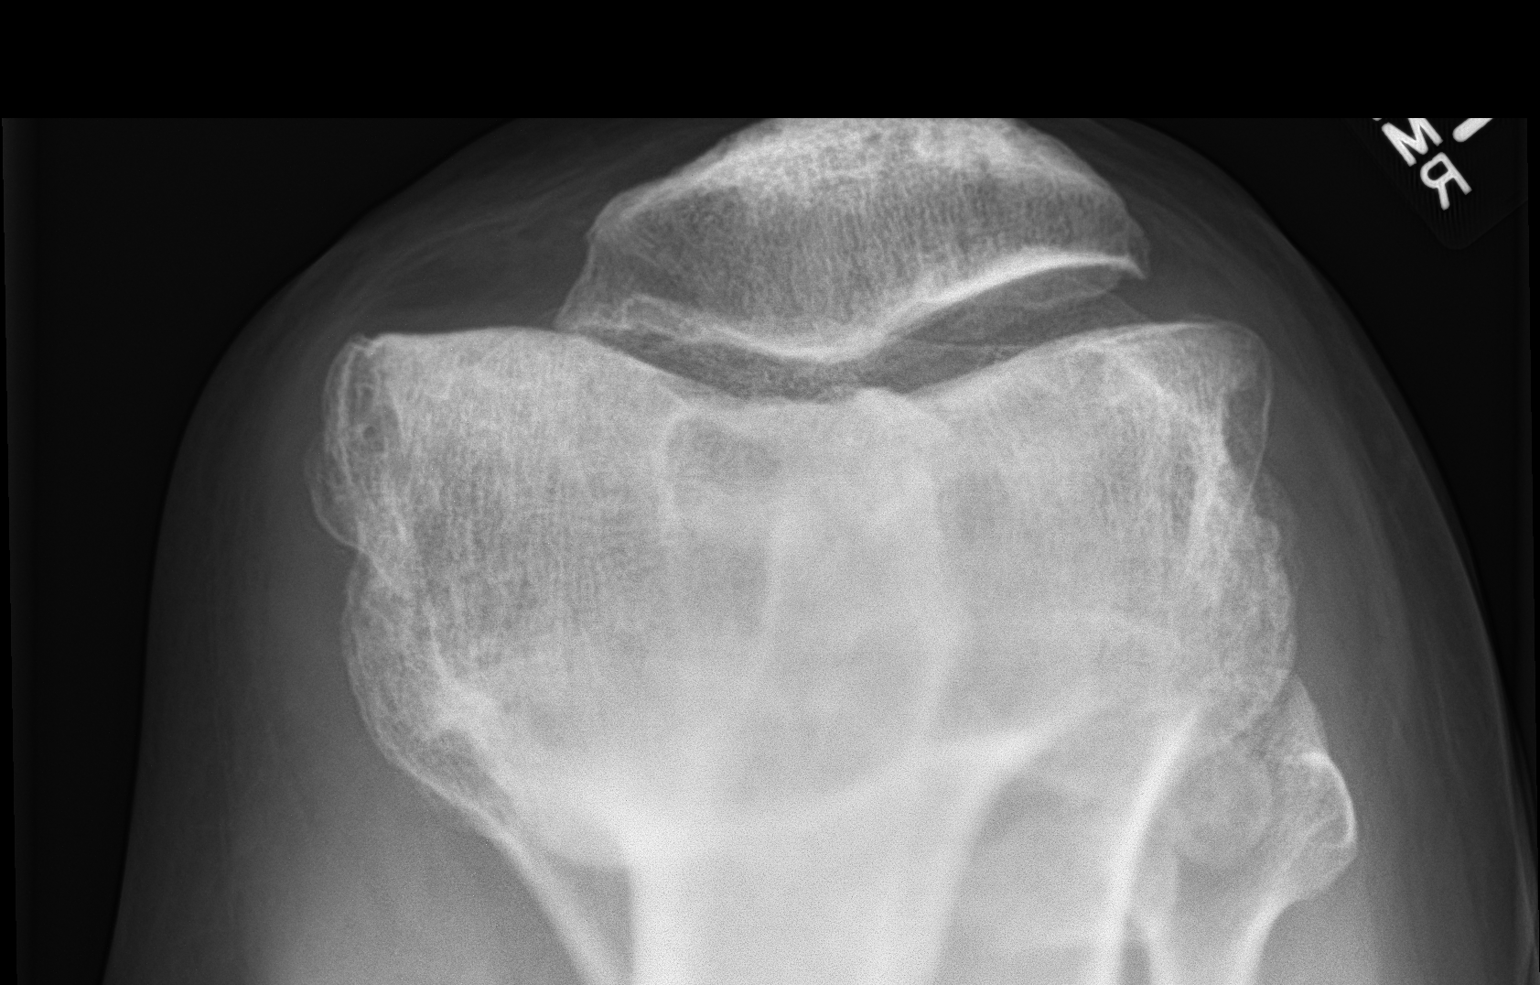

[3 of 3 positions shown; findings below may reference images not displayed]

FINDINGS: The bones are subjectively mildly osteopenic. There is severe joint
space loss of the medial compartment with moderate to severe loss of
the lateral joint space. Depression of the medial tibial plateau is
present but chronic. There is mild lateral subluxation of the tibial
plateaus with respect to the femoral condyles. There is beaking of
the tibial spines. There are spurs arising from the articular
margins of the patella. There are capsular and intra articular
calcified loose bodies. 80s suprapatellar effusion is suspected.
There is no acute fracture.
IMPRESSION: Severe osteoarthritic change of the left knee centered on the medial
compartment with moderate to severe lateral compartment oral changes
and moderate patellofemoral changes which has worsened since
previous studies. There is lateral subluxation of the tibia with
respect to the femoral condyles amounting to approximately 1 cm.
This is also more conspicuous than on the previous studies.

## 2016-04-03 MED ORDER — MUPIROCIN 2 % EX OINT
TOPICAL_OINTMENT | CUTANEOUS | Status: AC
  Filled 2016-04-03: qty 22

## 2016-04-04 ENCOUNTER — Encounter (HOSPITAL_COMMUNITY): Payer: Self-pay | Admitting: Anesthesiology

## 2016-04-04 NOTE — Anesthesia Preprocedure Evaluation (Addendum)
Anesthesia Evaluation  Patient identified by MRN, date of birth, ID band Patient awake    Reviewed: Allergy & Precautions, NPO status , Patient's Chart, lab work & pertinent test results  Airway Mallampati: II  TM Distance: >3 FB Neck ROM: Full    Dental no notable dental hx. (+) Teeth Intact   Pulmonary neg pulmonary ROS,    Pulmonary exam normal breath sounds clear to auscultation- rhonchi       Cardiovascular negative cardio ROS Normal cardiovascular exam Rhythm:Regular Rate:Normal  EKG- 01/23/2015 Sinus tachycardia, LAFB, LVH   Neuro/Psych negative neurological ROS  negative psych ROS   GI/Hepatic negative GI ROS, Neg liver ROS,   Endo/Other  negative endocrine ROS  Renal/GU negative Renal ROS  negative genitourinary   Musculoskeletal  (+) Arthritis , Osteoarthritis,  OA left knee   Abdominal   Peds  Hematology negative hematology ROS (+)   Anesthesia Other Findings   Reproductive/Obstetrics                            Lab Results  Component Value Date   WBC 7.6 04/03/2016   HGB 16.2 04/03/2016   HCT 47.3 04/03/2016   MCV 96.9 04/03/2016   PLT 197 04/03/2016     Chemistry      Component Value Date/Time   NA 134 (L) 04/03/2016 0915   K 4.4 04/03/2016 0915   CL 101 04/03/2016 0915   CO2 27 04/03/2016 0915   BUN 12 04/03/2016 0915   CREATININE 0.94 04/03/2016 0915      Component Value Date/Time   CALCIUM 9.5 04/03/2016 0915     EKG: normal sinus rhythm, LAFB.  Anesthesia Physical Anesthesia Plan  ASA: II  Anesthesia Plan: Spinal   Post-op Pain Management:    Induction:   Airway Management Planned: Natural Airway and Nasal Cannula  Additional Equipment:   Intra-op Plan:   Post-operative Plan:   Informed Consent: I have reviewed the patients History and Physical, chart, labs and discussed the procedure including the risks, benefits and alternatives for the  proposed anesthesia with the patient or authorized representative who has indicated his/her understanding and acceptance.   Dental advisory given  Plan Discussed with: Anesthesiologist, CRNA and Surgeon  Anesthesia Plan Comments:         Anesthesia Quick Evaluation

## 2016-04-05 LAB — PREPARE RBC (CROSSMATCH)

## 2016-04-05 LAB — ABO/RH: ABO/RH(D): O POS
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
## 2016-04-06 ENCOUNTER — Encounter (HOSPITAL_COMMUNITY)
Admission: RE | Disposition: A | Discharge status: Home Health | Payer: Self-pay | Source: Ambulatory Visit | Attending: Orthopedic Surgery

## 2016-04-06 ENCOUNTER — Inpatient Hospital Stay (HOSPITAL_COMMUNITY): Payer: Medicaid Other | Admitting: Anesthesiology

## 2016-04-06 ENCOUNTER — Encounter (HOSPITAL_COMMUNITY): Payer: Self-pay | Admitting: *Deleted

## 2016-04-06 ENCOUNTER — Inpatient Hospital Stay (HOSPITAL_COMMUNITY)
Admission: RE | Admit: 2016-04-06 | Discharge: 2016-04-08 | DRG: 470 | Disposition: A | Discharge status: Home Health | Payer: Medicaid Other | Source: Ambulatory Visit | Attending: Orthopedic Surgery | Admitting: Orthopedic Surgery

## 2016-04-06 ENCOUNTER — Inpatient Hospital Stay (HOSPITAL_COMMUNITY): Payer: Medicaid Other

## 2016-04-06 DIAGNOSIS — M2242 Chondromalacia patellae, left knee: Secondary | ICD-10-CM | POA: Diagnosis present

## 2016-04-06 DIAGNOSIS — M1712 Unilateral primary osteoarthritis, left knee: Principal | ICD-10-CM | POA: Diagnosis present

## 2016-04-06 DIAGNOSIS — Z09 Encounter for follow-up examination after completed treatment for conditions other than malignant neoplasm: Secondary | ICD-10-CM

## 2016-04-06 DIAGNOSIS — M25562 Pain in left knee: Secondary | ICD-10-CM | POA: Diagnosis present

## 2016-04-06 HISTORY — PX: TOTAL KNEE ARTHROPLASTY: SHX125

## 2016-04-06 SURGERY — ARTHROPLASTY, KNEE, TOTAL
Anesthesia: Spinal | Site: Knee | Laterality: Left

## 2016-04-06 MED ORDER — ONDANSETRON HCL 4 MG PO TABS: 4.0000 mg | ORAL_TABLET | Freq: Four times a day (QID) | ORAL | Status: DC | PRN

## 2016-04-06 MED ORDER — SODIUM CHLORIDE 0.9 % IR SOLN
Status: DC | PRN
  Administered 2016-04-06: 1500 mL

## 2016-04-06 MED ORDER — ONDANSETRON HCL 4 MG/2ML IJ SOLN: 4.0000 mg | Freq: Four times a day (QID) | INTRAMUSCULAR | Status: DC | PRN

## 2016-04-06 MED ORDER — PROPOFOL 10 MG/ML IV BOLUS
INTRAVENOUS | Status: AC
  Filled 2016-04-06: qty 20

## 2016-04-06 MED ORDER — SODIUM CHLORIDE 0.9 % IJ SOLN
INTRAMUSCULAR | Status: AC
  Filled 2016-04-06: qty 20

## 2016-04-06 MED ORDER — SODIUM CHLORIDE 0.9 % IV SOLN: Freq: Once | INTRAVENOUS | Status: DC

## 2016-04-06 MED ORDER — ONDANSETRON HCL 4 MG/2ML IJ SOLN
4.0000 mg | Freq: Once | INTRAMUSCULAR | Status: AC
  Administered 2016-04-06: 4 mg via INTRAVENOUS
  Filled 2016-04-06: qty 2

## 2016-04-06 MED ORDER — METOCLOPRAMIDE HCL 10 MG PO TABS: 5.0000 mg | ORAL_TABLET | Freq: Three times a day (TID) | ORAL | Status: DC | PRN

## 2016-04-06 MED ORDER — BUPIVACAINE LIPOSOME 1.3 % IJ SUSP
INTRAMUSCULAR | Status: AC
  Filled 2016-04-06: qty 20

## 2016-04-06 MED ORDER — BUPIVACAINE-EPINEPHRINE (PF) 0.5% -1:200000 IJ SOLN
INTRAMUSCULAR | Status: DC | PRN
  Administered 2016-04-06: 30 mL

## 2016-04-06 MED ORDER — BISACODYL 5 MG PO TBEC: 5.0000 mg | DELAYED_RELEASE_TABLET | Freq: Every day | ORAL | Status: DC | PRN

## 2016-04-06 MED ORDER — KETOROLAC TROMETHAMINE 15 MG/ML IJ SOLN
15.0000 mg | Freq: Four times a day (QID) | INTRAMUSCULAR | Status: AC
  Administered 2016-04-07 (×4): 15 mg via INTRAVENOUS
  Filled 2016-04-06 (×4): qty 1

## 2016-04-06 MED ORDER — FENTANYL CITRATE (PF) 100 MCG/2ML IJ SOLN
INTRAMUSCULAR | Status: AC
  Filled 2016-04-06: qty 2

## 2016-04-06 MED ORDER — CHLORHEXIDINE GLUCONATE 4 % EX LIQD: 60.0000 mL | Freq: Once | CUTANEOUS | Status: DC

## 2016-04-06 MED ORDER — 0.9 % SODIUM CHLORIDE (POUR BTL) OPTIME
TOPICAL | Status: DC | PRN
  Administered 2016-04-06: 1000 mL

## 2016-04-06 MED ORDER — TRANEXAMIC ACID 1000 MG/10ML IV SOLN
1000.0000 mg | INTRAVENOUS | Status: AC
  Administered 2016-04-06: 1000 mg via INTRAVENOUS
  Filled 2016-04-06: qty 10

## 2016-04-06 MED ORDER — MUPIROCIN 2 % EX OINT
TOPICAL_OINTMENT | CUTANEOUS | Status: AC
  Filled 2016-04-06: qty 22

## 2016-04-06 MED ORDER — MENTHOL 3 MG MT LOZG
1.0000 | LOZENGE | OROMUCOSAL | Status: DC | PRN
Start: 2016-04-06 — End: 2016-04-08

## 2016-04-06 MED ORDER — MIDAZOLAM HCL 5 MG/5ML IJ SOLN
INTRAMUSCULAR | Status: DC | PRN
  Administered 2016-04-06 (×2): 2 mg via INTRAVENOUS

## 2016-04-06 MED ORDER — SODIUM CHLORIDE 0.9 % IV SOLN
INTRAVENOUS | Status: DC
  Administered 2016-04-06: 15:00:00 via INTRAVENOUS

## 2016-04-06 MED ORDER — BUPIVACAINE-EPINEPHRINE (PF) 0.5% -1:200000 IJ SOLN
INTRAMUSCULAR | Status: AC
  Filled 2016-04-06: qty 30

## 2016-04-06 MED ORDER — PROPOFOL 10 MG/ML IV BOLUS
INTRAVENOUS | Status: AC
  Filled 2016-04-06: qty 40

## 2016-04-06 MED ORDER — PROPOFOL 500 MG/50ML IV EMUL
INTRAVENOUS | Status: DC | PRN
  Administered 2016-04-06 (×4): via INTRAVENOUS
  Administered 2016-04-06: 50 ug/kg/min via INTRAVENOUS

## 2016-04-06 MED ORDER — OXYCODONE HCL 5 MG PO TABS
5.0000 mg | ORAL_TABLET | ORAL | Status: DC | PRN
  Administered 2016-04-06 – 2016-04-07 (×2): 5 mg via ORAL
  Administered 2016-04-08 (×2): 10 mg via ORAL
  Filled 2016-04-06: qty 1
  Filled 2016-04-06: qty 2
  Filled 2016-04-06: qty 1
  Filled 2016-04-06: qty 2

## 2016-04-06 MED ORDER — ALUM & MAG HYDROXIDE-SIMETH 200-200-20 MG/5ML PO SUSP: 30.0000 mL | ORAL | Status: DC | PRN

## 2016-04-06 MED ORDER — METOCLOPRAMIDE HCL 5 MG/ML IJ SOLN
5.0000 mg | Freq: Three times a day (TID) | INTRAMUSCULAR | Status: DC | PRN
Start: 2016-04-06 — End: 2016-04-08

## 2016-04-06 MED ORDER — LACTATED RINGERS IV SOLN
Freq: Once | INTRAVENOUS | Status: AC
  Administered 2016-04-06: 1000 mL via INTRAVENOUS

## 2016-04-06 MED ORDER — ACETAMINOPHEN 325 MG PO TABS: 650.0000 mg | ORAL_TABLET | Freq: Four times a day (QID) | ORAL | Status: DC | PRN

## 2016-04-06 MED ORDER — CELECOXIB 100 MG PO CAPS
200.0000 mg | ORAL_CAPSULE | Freq: Two times a day (BID) | ORAL | Status: DC
  Administered 2016-04-06 – 2016-04-08 (×4): 200 mg via ORAL
  Filled 2016-04-06 (×4): qty 2

## 2016-04-06 MED ORDER — ASPIRIN EC 325 MG PO TBEC
325.0000 mg | DELAYED_RELEASE_TABLET | Freq: Every day | ORAL | Status: DC
  Administered 2016-04-07 – 2016-04-08 (×2): 325 mg via ORAL
  Filled 2016-04-06 (×2): qty 1

## 2016-04-06 MED ORDER — SENNA 8.6 MG PO TABS
1.0000 | ORAL_TABLET | Freq: Two times a day (BID) | ORAL | Status: DC
  Administered 2016-04-06 – 2016-04-08 (×5): 8.6 mg via ORAL
  Filled 2016-04-06 (×5): qty 1

## 2016-04-06 MED ORDER — MUPIROCIN 2 % EX OINT
1.0000 "application " | TOPICAL_OINTMENT | Freq: Two times a day (BID) | CUTANEOUS | Status: DC
  Administered 2016-04-06: 1 via TOPICAL

## 2016-04-06 MED ORDER — ONDANSETRON HCL 4 MG/2ML IJ SOLN
INTRAMUSCULAR | Status: AC
  Filled 2016-04-06: qty 2

## 2016-04-06 MED ORDER — OXYCODONE-ACETAMINOPHEN 5-325 MG PO TABS
1.0000 | ORAL_TABLET | ORAL | Status: DC
  Administered 2016-04-06 – 2016-04-07 (×9): 1 via ORAL
  Filled 2016-04-06 (×9): qty 1

## 2016-04-06 MED ORDER — PREGABALIN 50 MG PO CAPS
50.0000 mg | ORAL_CAPSULE | Freq: Three times a day (TID) | ORAL | Status: DC
  Administered 2016-04-06 – 2016-04-08 (×5): 50 mg via ORAL
  Filled 2016-04-06 (×6): qty 1

## 2016-04-06 MED ORDER — DEXAMETHASONE SODIUM PHOSPHATE 10 MG/ML IJ SOLN
10.0000 mg | Freq: Once | INTRAMUSCULAR | Status: AC
  Administered 2016-04-07: 10 mg via INTRAVENOUS
  Filled 2016-04-06: qty 1

## 2016-04-06 MED ORDER — ONDANSETRON HCL 4 MG/2ML IJ SOLN: 4.0000 mg | Freq: Once | INTRAMUSCULAR | Status: DC | PRN

## 2016-04-06 MED ORDER — POLYETHYLENE GLYCOL 3350 17 G PO PACK
17.0000 g | PACK | Freq: Every day | ORAL | Status: DC
  Administered 2016-04-06 – 2016-04-08 (×3): 17 g via ORAL
  Filled 2016-04-06 (×3): qty 1

## 2016-04-06 MED ORDER — CEFAZOLIN SODIUM-DEXTROSE 2-4 GM/100ML-% IV SOLN
2.0000 g | INTRAVENOUS | Status: AC
  Administered 2016-04-06: 2 g via INTRAVENOUS
  Filled 2016-04-06: qty 100

## 2016-04-06 MED ORDER — METHOCARBAMOL 1000 MG/10ML IJ SOLN: 500.0000 mg | Freq: Four times a day (QID) | INTRAMUSCULAR | Status: DC | PRN

## 2016-04-06 MED ORDER — BUPIVACAINE LIPOSOME 1.3 % IJ SUSP: 20.0000 mL | Freq: Once | INTRAMUSCULAR | Status: DC

## 2016-04-06 MED ORDER — BUPIVACAINE IN DEXTROSE 0.75-8.25 % IT SOLN
INTRATHECAL | Status: AC
  Filled 2016-04-06: qty 2

## 2016-04-06 MED ORDER — ACETAMINOPHEN 650 MG RE SUPP: 650.0000 mg | Freq: Four times a day (QID) | RECTAL | Status: DC | PRN

## 2016-04-06 MED ORDER — PHENOL 1.4 % MT LIQD: 1.0000 | OROMUCOSAL | Status: DC | PRN

## 2016-04-06 MED ORDER — CELECOXIB 400 MG PO CAPS
400.0000 mg | ORAL_CAPSULE | Freq: Once | ORAL | Status: AC
  Administered 2016-04-06: 400 mg via ORAL
  Filled 2016-04-06: qty 1

## 2016-04-06 MED ORDER — PREGABALIN 50 MG PO CAPS
50.0000 mg | ORAL_CAPSULE | Freq: Once | ORAL | Status: AC
  Administered 2016-04-06: 50 mg via ORAL
  Filled 2016-04-06: qty 1

## 2016-04-06 MED ORDER — MAGNESIUM CITRATE PO SOLN: 1.0000 | Freq: Once | ORAL | Status: DC | PRN

## 2016-04-06 MED ORDER — MIDAZOLAM HCL 2 MG/2ML IJ SOLN
INTRAMUSCULAR | Status: AC
  Filled 2016-04-06: qty 4

## 2016-04-06 MED ORDER — MIDAZOLAM HCL 2 MG/2ML IJ SOLN
INTRAMUSCULAR | Status: AC
  Filled 2016-04-06: qty 2

## 2016-04-06 MED ORDER — HYDROMORPHONE HCL 1 MG/ML IJ SOLN: 0.2500 mg | INTRAMUSCULAR | Status: DC | PRN

## 2016-04-06 MED ORDER — MEPERIDINE HCL 50 MG/ML IJ SOLN: 6.2500 mg | INTRAMUSCULAR | Status: DC | PRN

## 2016-04-06 MED ORDER — CEFAZOLIN SODIUM-DEXTROSE 2-4 GM/100ML-% IV SOLN
2.0000 g | Freq: Four times a day (QID) | INTRAVENOUS | Status: AC
  Administered 2016-04-06 (×2): 2 g via INTRAVENOUS
  Filled 2016-04-06 (×2): qty 100

## 2016-04-06 MED ORDER — GLYCOPYRROLATE 0.2 MG/ML IJ SOLN
INTRAMUSCULAR | Status: DC | PRN
  Administered 2016-04-06: 0.2 mg via INTRAVENOUS

## 2016-04-06 MED ORDER — GLYCOPYRROLATE 0.2 MG/ML IJ SOLN
INTRAMUSCULAR | Status: AC
  Filled 2016-04-06: qty 1

## 2016-04-06 MED ORDER — OXYCODONE HCL 5 MG PO TABS
5.0000 mg | ORAL_TABLET | Freq: Once | ORAL | Status: AC
  Administered 2016-04-06: 5 mg via ORAL
  Filled 2016-04-06: qty 1

## 2016-04-06 MED ORDER — EPHEDRINE SULFATE 50 MG/ML IJ SOLN
INTRAMUSCULAR | Status: DC | PRN
  Administered 2016-04-06 (×2): 10 mg via INTRAVENOUS

## 2016-04-06 MED ORDER — METHOCARBAMOL 1000 MG/10ML IJ SOLN
500.0000 mg | Freq: Once | INTRAVENOUS | Status: AC
  Administered 2016-04-06: 500 mg via INTRAVENOUS
  Filled 2016-04-06: qty 5

## 2016-04-06 MED ORDER — DIPHENHYDRAMINE HCL 12.5 MG/5ML PO ELIX: 12.5000 mg | ORAL_SOLUTION | ORAL | Status: DC | PRN

## 2016-04-06 MED ORDER — METHOCARBAMOL 500 MG PO TABS
500.0000 mg | ORAL_TABLET | Freq: Four times a day (QID) | ORAL | Status: DC | PRN
  Administered 2016-04-06 – 2016-04-07 (×2): 500 mg via ORAL
  Filled 2016-04-06 (×2): qty 1

## 2016-04-06 MED ORDER — LACTATED RINGERS IV SOLN
INTRAVENOUS | Status: DC | PRN
  Administered 2016-04-06 (×2): via INTRAVENOUS

## 2016-04-06 MED ORDER — HYDROMORPHONE HCL 1 MG/ML IJ SOLN
0.5000 mg | INTRAMUSCULAR | Status: DC | PRN
  Administered 2016-04-06 – 2016-04-08 (×13): 0.5 mg via INTRAVENOUS
  Filled 2016-04-06 (×13): qty 1

## 2016-04-06 MED ORDER — ONDANSETRON HCL 4 MG/2ML IJ SOLN
INTRAMUSCULAR | Status: DC | PRN
  Administered 2016-04-06: 4 mg via INTRAVENOUS

## 2016-04-06 MED ORDER — SODIUM CHLORIDE 0.9 % IV SOLN
INTRAVENOUS | Status: DC | PRN
  Administered 2016-04-06: 60 mL

## 2016-04-06 MED ORDER — FENTANYL CITRATE (PF) 100 MCG/2ML IJ SOLN
INTRAMUSCULAR | Status: DC | PRN
  Administered 2016-04-06 (×4): 25 ug via INTRAVENOUS

## 2016-04-06 MED ORDER — BUPIVACAINE IN DEXTROSE 0.75-8.25 % IT SOLN
INTRATHECAL | Status: DC | PRN
  Administered 2016-04-06: 15 mg via INTRATHECAL

## 2016-04-06 SURGICAL SUPPLY — 67 items
BAG HAMPER (MISCELLANEOUS) ×3 IMPLANT
BANDAGE ESMARK 6X9 LF (GAUZE/BANDAGES/DRESSINGS) ×1 IMPLANT
BIT DRILL 3.2X128 (BIT) IMPLANT
BIT DRILL 3.2X128MM (BIT)
BLADE HEX COATED 2.75 (ELECTRODE) ×3 IMPLANT
BLADE SAGITTAL 25.0X1.27X90 (BLADE) ×2 IMPLANT
BLADE SAGITTAL 25.0X1.27X90MM (BLADE) ×1
BNDG ESMARK 6X9 LF (GAUZE/BANDAGES/DRESSINGS) ×3
BOWL SMART MIX CTS (DISPOSABLE) IMPLANT
CAP KNEE TOTAL 3 SIGMA ×3 IMPLANT
CEMENT HV SMART SET (Cement) ×6 IMPLANT
CLOTH BEACON ORANGE TIMEOUT ST (SAFETY) ×3 IMPLANT
COOLER CRYO CUFF IC AND MOTOR (MISCELLANEOUS) ×3 IMPLANT
COVER LIGHT HANDLE STERIS (MISCELLANEOUS) ×6 IMPLANT
CUFF CRYO KNEE LG 20X31 COOLER (ORTHOPEDIC SUPPLIES) IMPLANT
CUFF CRYO KNEE18X23 MED (MISCELLANEOUS) ×3 IMPLANT
CUFF TOURNIQUET SINGLE 34IN LL (TOURNIQUET CUFF) ×3 IMPLANT
DECANTER SPIKE VIAL GLASS SM (MISCELLANEOUS) ×3 IMPLANT
DRAPE BACK TABLE (DRAPES) ×3 IMPLANT
DRAPE EXTREMITY T 121X128X90 (DRAPE) ×3 IMPLANT
DRSG MEPILEX BORDER 4X12 (GAUZE/BANDAGES/DRESSINGS) ×3 IMPLANT
DURAPREP 26ML APPLICATOR (WOUND CARE) ×6 IMPLANT
ELECT REM PT RETURN 9FT ADLT (ELECTROSURGICAL) ×3
ELECTRODE REM PT RTRN 9FT ADLT (ELECTROSURGICAL) ×1 IMPLANT
EVACUATOR 3/16  PVC DRAIN (DRAIN) ×2
EVACUATOR 3/16 PVC DRAIN (DRAIN) ×1 IMPLANT
GLOVE BIO SURGEON STRL SZ7 (GLOVE) ×6 IMPLANT
GLOVE BIOGEL PI IND STRL 7.0 (GLOVE) ×2 IMPLANT
GLOVE BIOGEL PI INDICATOR 7.0 (GLOVE) ×4
GLOVE SKINSENSE NS SZ8.0 LF (GLOVE) ×4
GLOVE SKINSENSE STRL SZ8.0 LF (GLOVE) ×2 IMPLANT
GLOVE SS N UNI LF 8.5 STRL (GLOVE) ×3 IMPLANT
GOWN STRL REUS W/ TWL LRG LVL3 (GOWN DISPOSABLE) ×1 IMPLANT
GOWN STRL REUS W/TWL LRG LVL3 (GOWN DISPOSABLE) ×8 IMPLANT
GOWN STRL REUS W/TWL XL LVL3 (GOWN DISPOSABLE) ×3 IMPLANT
HANDPIECE INTERPULSE COAX TIP (DISPOSABLE) ×2
HOOD W/PEELAWAY (MISCELLANEOUS) ×12 IMPLANT
INST SET MAJOR BONE (KITS) ×3 IMPLANT
IV NS IRRIG 3000ML ARTHROMATIC (IV SOLUTION) ×3 IMPLANT
KIT BLADEGUARD II DBL (SET/KITS/TRAYS/PACK) ×3 IMPLANT
KIT ROOM TURNOVER APOR (KITS) ×3 IMPLANT
MANIFOLD NEPTUNE II (INSTRUMENTS) ×3 IMPLANT
MARKER SKIN DUAL TIP RULER LAB (MISCELLANEOUS) ×3 IMPLANT
NEEDLE HYPO 21X1.5 SAFETY (NEEDLE) ×3 IMPLANT
NS IRRIG 1000ML POUR BTL (IV SOLUTION) ×3 IMPLANT
PACK TOTAL JOINT (CUSTOM PROCEDURE TRAY) ×3 IMPLANT
PAD ARMBOARD 7.5X6 YLW CONV (MISCELLANEOUS) ×3 IMPLANT
PAD DANNIFLEX CPM (ORTHOPEDIC SUPPLIES) ×3 IMPLANT
PIN TROCAR 3 INCH (PIN) IMPLANT
SAW OSC TIP CART 19.5X105X1.3 (SAW) ×3 IMPLANT
SET BASIN LINEN APH (SET/KITS/TRAYS/PACK) ×3 IMPLANT
SET HNDPC FAN SPRY TIP SCT (DISPOSABLE) ×1 IMPLANT
STAPLER VISISTAT 35W (STAPLE) ×3 IMPLANT
SUT BRALON NAB BRD #1 30IN (SUTURE) ×6 IMPLANT
SUT MNCRL 0 VIOLET CTX 36 (SUTURE) ×1 IMPLANT
SUT MON AB 0 CT1 (SUTURE) ×3 IMPLANT
SUT MON AB 2-0 CT1 36 (SUTURE) ×3 IMPLANT
SUT MONOCRYL 0 CTX 36 (SUTURE) ×2
SYR 20CC LL (SYRINGE) ×3 IMPLANT
SYR 30ML LL (SYRINGE) ×3 IMPLANT
SYR BULB IRRIGATION 50ML (SYRINGE) ×3 IMPLANT
TAPE CLOTH SURG 4X10 WHT LF (GAUZE/BANDAGES/DRESSINGS) ×3 IMPLANT
TOWEL OR 17X26 4PK STRL BLUE (TOWEL DISPOSABLE) ×3 IMPLANT
TOWER CARTRIDGE SMART MIX (DISPOSABLE) ×3 IMPLANT
TRAY FOLEY W/METER SILVER 16FR (SET/KITS/TRAYS/PACK) ×3 IMPLANT
WATER STERILE IRR 1000ML POUR (IV SOLUTION) ×6 IMPLANT
YANKAUER SUCT 12FT TUBE ARGYLE (SUCTIONS) ×3 IMPLANT
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
## 2016-04-06 NOTE — Brief Op Note (Signed)
04/06/2016  1:32 PM  PATIENT:  Tyler Hart  55 y.o. male  PRE-OPERATIVE DIAGNOSIS:  LEFT KNEE OSTEOARTHRITIS  POST-OPERATIVE DIAGNOSIS:  LEFT KNEE OSTEOARTHRITIS  This knee was really bad. In a flexion contracture between 5-10. Fortunately had knee flexion preop 125. He had a bad varus knee with fixed deformity. He had several loose bodies in the joint that were large. 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  I was assisted by Simonne Maffucci and Nelwyn Salisbury  We used a Depew Sigma fixed bearing total knee with a 10 tibia polyethylene PS insert a 4 tibia 5 femur and a 41 patella by 11.5  Anesthesia was spinal  PROCEDURE:  Procedure(s): TOTAL KNEE ARTHROPLASTY (Left)  SURGEON:  Surgeon(s) and Role:    * Carole Civil, MD - Primary  EBL:  Total I/O In: 1600 [I.V.:1600] Out: 275 [Urine:250; Blood:25]  BLOOD ADMINISTERED:none   One Hemovac drain activated in the joint  30 mL Marcaine with epinephrine and exparel 20 mL diluted with 40 mL  There were no specimens  Counts were correct  TOURNIQUET:   Total Tourniquet Time Documented: Thigh (Left) - 101 minutes Total: Thigh (Left) - 101 minutes   Dragon Dictation  To PACU stable    DICTATION: .Dragon Dictation  PLAN OF CARE: Admit to inpatient   PATIENT DISPOSITION:  PACU - hemodynamically stable.   Delay start of Pharmacological VTE agent (>24hrs) due to surgical blood loss or risk of bleeding: yes   Surgical dictation for left total knee  Preop diagnosis osteoarthritis left knee  Postop diagnosis osteoarthritis left knee    details of procedure:   The patient was identified in the preop holding area and the surgical site was confirmed as the left knee. Chart review and update were  completed. The patient was taken to the operating room for spinal anesthesia. After successful spinal anesthesia Foley catheter was inserted. The patient was placed supine on the operating table.   the left leg was prepped with ChloraPrep and draped sterilely. Timeout was completed. The limb was then exsanguinated a  6 inch Esmarch. The tourniquet was elevated to 300 mmHg.   A midline incision was made and taken down to the extensor mechanism followed by medial arthrotomy. The patella was everted. Side effect to me was performed as needed. The osteophytes were resected.  Anterior cruciate ligament and PCL and medial and lateral meniscus were resected.   a 3/8 inch drill bit was used to enter the femoral canal which was suctioned and irrigated until the fluid was clear. The distal femoral cut was set for 11 millimeter resection with a 5   Left Valgus angle. This cut was completed and checked for flatness.   the femur was then measured to a size 5.  The cutting block was placed to match the epicondyles and the 4 distal cuts were made.   the tibia was subluxated forward and the external alignment guide was placed. We removed 10 mm of bone from the higher lateral side. We set the guide for neutral varus valgus cut related to the  Mechanical axis of the tibia and for slope matching the patient's anatomy. Rotational alignment was set using the tibial tubercle, tibial spine and second metatarsal. The cutting block was pinned and the proximal tibia was resected.  The tibia had a posteromedial defect which after cutting 10 mm from the higher lateral side was assessed and found to be in uncontained defect. By using a 4 mm baseplate and externally rotated and we essentially avoided the defect.  This knee had to be released posterior medial as well as medial including posterior medial femur soft tissue capsule.   spacer blocks were placed starting with a 10 mm insert to confirm equal flexion-extension gaps. A  size  10  mm insert balanced the gaps.   We placed the femoral notch cutting guide size 5  and resected the notch.   Trial implants replaced using appropriate size femur , appropriate size tibial baseplate which was measured after the proximal tibia resection. Tibial rotation was set patella tracking was normal   The tibia was then punched per manufacture technique making sure to avoid internal rotation.   The patella measured a size 26   We resected down to a size 15 using a size 11.5 x 41 button.   Final range of motion check was performed with the appropriate size trials as mentioned above. Satisfactory reduction and motion were obtained.   Trial implants were removed. The bone was irrigated and dried and the cement was mixed on the back table  These implants were then cemented in place. Excess cement was removed. The cement was allowed to cure. Second irrigation was performed.    FInal range of motion check and stability check was completed  The wound was irrigated third time Hemovac drain was placed, extensor mechanism was closed with #1 Nurolon followed by 0 Monocryl and staples to reapproximate the skin edges and subcutaneous tissue.   Sterile dressing was applied  The patient was taken recovery in stable condition

## 2016-04-06 NOTE — Anesthesia Postprocedure Evaluation (Signed)
Anesthesia Post Note  Patient: Tyler Hart  Procedure(s) Performed: Procedure(s) (LRB): TOTAL KNEE ARTHROPLASTY (Left)  Patient location during evaluation: PACU Anesthesia Type: Spinal Level of consciousness: oriented and awake and alert Pain management: pain level controlled Vital Signs Assessment: post-procedure vital signs reviewed and stable Respiratory status: spontaneous breathing, respiratory function stable, nonlabored ventilation and patient connected to nasal cannula oxygen Cardiovascular status: blood pressure returned to baseline and stable Postop Assessment: no headache, no backache, spinal receding and no signs of nausea or vomiting Anesthetic complications: no    Last Vitals:  Vitals:   04/06/16 1020 04/06/16 1310  BP: 122/80 113/76  Pulse:  78  Resp: 14 18  Temp:  36.4 C    Last Pain:  Vitals:   04/06/16 0920  TempSrc: Oral  PainSc: 5                  Chennel Olivos A.

## 2016-04-06 NOTE — Anesthesia Procedure Notes (Addendum)
Spinal  Patient location during procedure: OR Staffing Anesthesiologist: Josephine Igo Resident/CRNA: IDACAVAGE, Chester Performed: anesthesiologist and resident/CRNA  Preanesthetic Checklist Completed: patient identified, site marked, surgical consent, pre-op evaluation, timeout performed, IV checked, risks and benefits discussed and monitors and equipment checked Spinal Block Patient position: sitting Prep: Betadine and site prepped and draped Patient monitoring: heart rate, cardiac monitor, continuous pulse ox and blood pressure Approach: midline Location: L4-5 Injection technique: single-shot Needle Needle type: Sprotte  Needle gauge: 24 G Needle length: 10 cm Needle insertion depth: 7 cm Assessment Sensory level: T6 Additional Notes Attempts lateral unsuccessful due to poor position. Patient tolerated procedure well. Adequate sensory level.

## 2016-04-06 NOTE — Progress Notes (Signed)
PT Cancellation Note  Patient Details Name: BRECK GOURDIN MRN: [id number] DOB: 01-14-1961   Cancelled Treatment:    Reason Eval/Treat Not Completed: Other (comment) (Pt arrived to the floor later this afternoon.  Pt given TKA exercise handout and encouraged to go through some of the exercises, and to sit up with the nursing staff.  Will perform full initial evaluation tomorrow. )   Beth Luvenia Cranford, PT, DPT X: [phone]

## 2016-04-06 NOTE — Op Note (Signed)
04/06/2016  1:32 PM  PATIENT:  Tyler Hart  55 y.o. male  PRE-OPERATIVE DIAGNOSIS:  LEFT KNEE OSTEOARTHRITIS  POST-OPERATIVE DIAGNOSIS:  LEFT KNEE OSTEOARTHRITIS  This knee was really bad. In a flexion contracture between 5-10. Fortunately had knee flexion preop 125. He had a bad varus knee with fixed deformity. He had several loose bodies in the joint that were large. 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  I was assisted by Simonne Maffucci and Nelwyn Salisbury  We used a [employer] fixed bearing total knee with a 10 tibia polyethylene PS insert a 4 tibia 5 femur and a 41 patella by 11.5  Anesthesia was spinal  PROCEDURE:  Procedure(s): TOTAL KNEE ARTHROPLASTY (Left)  SURGEON:  Surgeon(s) and Role:    * Carole Civil, MD - Primary  EBL:  Total I/O In: 1600 [I.V.:1600] Out: 275 [Urine:250; Blood:25]  BLOOD ADMINISTERED:none   One Hemovac drain activated in the joint  30 mL Marcaine with epinephrine and exparel 20 mL diluted with 40 mL  There were no specimens  Counts were correct  TOURNIQUET:   Total Tourniquet Time Documented: Thigh (Left) - 101 minutes Total: Thigh (Left) - 101 minutes   Dragon Dictation  To PACU stable    DICTATION: .Dragon Dictation  PLAN OF CARE: Admit to inpatient   PATIENT DISPOSITION:  PACU - hemodynamically stable.   Delay start of Pharmacological VTE agent (>24hrs) due to surgical blood loss or risk of bleeding: yes   Surgical dictation for left total knee  Preop diagnosis osteoarthritis left knee  Postop diagnosis osteoarthritis left knee    details of procedure:   The patient was identified in the preop holding area and the surgical site was confirmed as the left knee. Chart review and update were  completed. The patient was taken to the operating room for spinal anesthesia. After successful spinal anesthesia Foley catheter was inserted. The patient was placed supine on the operating table.   the left leg was prepped with ChloraPrep and draped sterilely. Timeout was completed. The limb was then exsanguinated a  6 inch Esmarch. The tourniquet was elevated to 300 mmHg.   A midline incision was made and taken down to the extensor mechanism followed by medial arthrotomy. The patella was everted. Side effect to me was performed as needed. The osteophytes were resected.  Anterior cruciate ligament and PCL and medial and lateral meniscus were resected.   a 3/8 inch drill bit was used to enter the femoral canal which was suctioned and irrigated until the fluid was clear. The distal femoral cut was set for 11 millimeter resection with a 5   Left Valgus angle. This cut was completed and checked for flatness.   the femur was then measured to a size 5.  The cutting block was placed to match the epicondyles and the 4 distal cuts were made.   the tibia was subluxated forward and the external alignment guide was placed. We removed 10 mm of bone from the higher lateral side. We set the guide for neutral varus valgus cut related to the  Mechanical axis of the tibia and for slope matching the patient's anatomy. Rotational alignment was set using the tibial tubercle, tibial spine and second metatarsal. The cutting block was pinned and the proximal tibia was resected.  The tibia had a posteromedial defect which after cutting 10 mm from the higher lateral side was assessed and found to be in uncontained defect. By using a 4 mm baseplate and externally rotated and we essentially avoided the defect.  This knee had to be released posterior medial as well as medial including posterior medial femur soft tissue capsule.   spacer blocks were placed starting with a 10 mm insert to confirm equal flexion-extension gaps. A  size  10  mm insert balanced the gaps.   We placed the femoral notch cutting guide size 5  and resected the notch.   Trial implants replaced using appropriate size femur , appropriate size tibial baseplate which was measured after the proximal tibia resection. Tibial rotation was set patella tracking was normal   The tibia was then punched per manufacture technique making sure to avoid internal rotation.   The patella measured a size 26   We resected down to a size 15 using a size 11.5 x 41 button.   Final range of motion check was performed with the appropriate size trials as mentioned above. Satisfactory reduction and motion were obtained.   Trial implants were removed. The bone was irrigated and dried and the cement was mixed on the back table  These implants were then cemented in place. Excess cement was removed. The cement was allowed to cure. Second irrigation was performed.    FInal range of motion check and stability check was completed  The wound was irrigated third time Hemovac drain was placed, extensor mechanism was closed with #1 Nurolon followed by 0 Monocryl and staples to reapproximate the skin edges and subcutaneous tissue.   Sterile dressing was applied  The patient was taken recovery in stable condition

## 2016-04-06 NOTE — Transfer of Care (Signed)
Immediate Anesthesia Transfer of Care Note  Patient: Tyler Hart  Procedure(s) Performed: Procedure(s): TOTAL KNEE ARTHROPLASTY (Left)  Patient Location: PACU  Anesthesia Type:Spinal  Level of Consciousness: awake, alert  and patient cooperative  Airway & Oxygen Therapy: Patient Spontanous Breathing and Patient connected to face mask oxygen  Post-op Assessment: Report given to RN and Post -op Vital signs reviewed and stable  Post vital signs: Reviewed and stable  Last Vitals:  Vitals:   04/06/16 1015 04/06/16 1020  BP: 127/80 122/80  Pulse:    Resp: 14 14  Temp:      Last Pain:  Vitals:   04/06/16 0920  TempSrc: Oral  PainSc: 5       Patients Stated Pain Goal: 6 ([id number])  Complications: No apparent anesthesia complications

## 2016-04-06 NOTE — Interval H&P Note (Signed)
History and Physical Interval Note:  04/06/2016 10:20 AM BP 125/82   Pulse 73   Temp 98 F (36.7 C) (Oral)   Resp 10   Ht 6\' 2"  (1.88 m)   Wt 231 lb (104.8 kg)   SpO2 96%   BMI 29.66 kg/m   Skin clean  Tyler Hart  has presented today for surgery, with the diagnosis of LEFT KNEE OSTEOARTHRITIS  The various methods of treatment have been discussed with the patient and family. After consideration of risks, benefits and other options for treatment, the patient has consented to  Procedure(s): TOTAL KNEE ARTHROPLASTY (Left) as a surgical intervention .  The patient's history has been reviewed, patient examined, no change in status, stable for surgery.  I have reviewed the patient's chart and labs.  Questions were answered to the patient's satisfaction.     Arther Abbott

## 2016-04-07 LAB — BASIC METABOLIC PANEL
ANION GAP: 5 (ref 5–15)
BUN: 8 mg/dL (ref 6–20)
CHLORIDE: 104 mmol/L (ref 101–111)
CO2: 27 mmol/L (ref 22–32)
Calcium: 8.2 mg/dL — ABNORMAL LOW (ref 8.9–10.3)
Creatinine, Ser: 0.9 mg/dL (ref 0.61–1.24)
GFR calc Af Amer: >60 mL/min (ref >60)
GLUCOSE: 117 mg/dL — AB (ref 65–99)
POTASSIUM: 3.9 mmol/L (ref 3.5–5.1)
Sodium: 136 mmol/L (ref 135–145)

## 2016-04-07 LAB — CBC
HEMATOCRIT: 38.5 % — AB (ref 39.0–52.0)
HEMOGLOBIN: 12.7 g/dL — AB (ref 13.0–17.0)
MCH: 32.5 pg (ref 26.0–34.0)
MCHC: 33 g/dL (ref 30.0–36.0)
MCV: 98.5 fL (ref 78.0–100.0)
Platelets: 165 10*3/uL (ref 150–400)
RBC: 3.91 MIL/uL — AB (ref 4.22–5.81)
RDW: 13.4 % (ref 11.5–15.5)
WBC: 11.4 10*3/uL — AB (ref 4.0–10.5)

## 2016-04-07 MED ORDER — POTASSIUM CHLORIDE CRYS ER 20 MEQ PO TBCR
20.0000 meq | EXTENDED_RELEASE_TABLET | Freq: Two times a day (BID) | ORAL | Status: DC
  Administered 2016-04-07 – 2016-04-08 (×3): 20 meq via ORAL
  Filled 2016-04-07 (×3): qty 1

## 2016-04-07 NOTE — Evaluation (Signed)
Physical Therapy Evaluation Patient Details Name: Tyler Hart MRN: [id number] DOB: 11/03/1960 Today's Date: 04/07/2016   History of Present Illness  55 yo M admitted 04/06/2016 for elective L TKA.  PMH: puncture wound, gout, L  L E I&D, knee surgery x 4.  Clinical Impression  Pt received in bed, and was agreeable to PT evaluation.  He is normally independent with all functional mobility, as well as ADL's, and IADL's.  Pt ambulated 184ft with RW and Min guard today.  Will practice 1 step this PM.  Recommend pt d/c home with HHPT, RW, and BSC.      Follow Up Recommendations Home health PT    Equipment Recommendations  Rolling walker with 5" wheels;3in1 (PT)    Recommendations for Other Services       Precautions / Restrictions Precautions Precautions: Knee Restrictions Weight Bearing Restrictions: Yes LLE Weight Bearing: Weight bearing as tolerated      Mobility  Bed Mobility Overal bed mobility: Independent                Transfers Overall transfer level: Needs assistance Equipment used: Rolling walker (2 wheeled) Transfers: Sit to/from [employer] Transfers Sit to Stand: Min guard Stand pivot transfers: Min guard          Ambulation/Gait Ambulation/Gait assistance: Min guard Ambulation Distance (Feet): 100 Feet Assistive device: Rolling walker (2 wheeled) Gait Pattern/deviations: Step-to pattern;Step-through pattern        Stairs            Wheelchair Mobility    Modified Rankin (Stroke Patients Only)       Balance Overall balance assessment: Needs assistance           Standing balance-Leahy Scale: Fair                               Pertinent Vitals/Pain Pain Assessment: 0-10 Pain Score: 7  Pain Location: L knee Pain Descriptors / Indicators: Aching Pain Intervention(s): Limited activity within patient's tolerance;Monitored during session;Patient requesting pain meds-RN notified    Home Living   Living  Arrangements: Spouse/significant other (fiance - works during the day M-F. ) Available Help at Discharge: Friend(s);Family Type of Home: House Home Access: Stairs to enter   [employer] of Steps: 4 no HR Home Layout: One level Home Equipment: Crutches      Prior Function Level of Independence: Independent with assistive device(s)   Gait / Transfers Assistance Needed: Sometimes using the crutches - when he has bad gout  ADL's / Homemaking Assistance Needed: Pt was independent with ADL's, and IADL's and driving.   Comments: Currently not working.      Hand Dominance   Dominant Hand: Right    Extremity/Trunk Assessment   Upper Extremity Assessment: Overall WFL for tasks assessed           Lower Extremity Assessment: LLE deficits/detail   LLE Deficits / Details: Grossly 3/5     Communication   Communication: No difficulties  Cognition Arousal/Alertness: Awake/alert Behavior During Therapy: WFL for tasks assessed/performed Overall Cognitive Status: Within Functional Limits for tasks assessed                      General Comments      Exercises Total Joint Exercises Ankle Circles/Pumps: AROM;Both;10 reps;Supine Quad Sets: Strengthening;Both;10 reps;Supine Gluteal Sets: Strengthening;Both;10 reps;Supine Heel Slides: AROM;Left;10 reps;Supine      Assessment/Plan    PT Assessment Patient  needs continued PT services  PT Diagnosis Difficulty walking;Abnormality of gait;Generalized weakness;Acute pain   PT Problem List Decreased strength;Decreased range of motion;Decreased activity tolerance;Decreased balance;Decreased mobility;Decreased safety awareness;Decreased knowledge of use of DME;Decreased knowledge of precautions;Pain  PT Treatment Interventions DME instruction;Gait training;Stair training;Functional mobility training;Therapeutic activities;Therapeutic exercise;Balance training;Patient/family education   PT Goals (Current goals can be  found in the Care Plan section) Acute Rehab PT Goals Patient Stated Goal: Pt wants to have less pain and walk normal PT Goal Formulation: With patient Time For Goal Achievement: 04/14/16 Potential to Achieve Goals: Good    Frequency BID   Barriers to discharge        Co-evaluation               End of Session Equipment Utilized During Treatment: Gait belt Activity Tolerance: Patient tolerated treatment well Patient left: in chair;with call bell/phone within reach Nurse Communication: Mobility status         Time: [id number] PT Time Calculation (min) (ACUTE ONLY): 43 min   Charges:   PT Evaluation $PT Eval Low Complexity: 1 Procedure PT Treatments $Gait Training: 8-22 mins $Therapeutic Exercise: 8-22 mins   PT G Codes:        Beth Tiaunna Buford, PT, DPT X: [phone]

## 2016-04-07 NOTE — Anesthesia Postprocedure Evaluation (Signed)
Anesthesia Post Note  Patient: Tyler Hart  Procedure(s) Performed: Procedure(s) (LRB): TOTAL KNEE ARTHROPLASTY (Left)  Patient location during evaluation: Nursing Unit Anesthesia Type: Spinal Level of consciousness: awake and alert, oriented and patient cooperative Pain management: pain level controlled Vital Signs Assessment: post-procedure vital signs reviewed and stable Respiratory status: spontaneous breathing, nonlabored ventilation and respiratory function stable Cardiovascular status: blood pressure returned to baseline and stable Postop Assessment: no signs of nausea or vomiting, adequate PO intake and no headache Anesthetic complications: no    Last Vitals:  Vitals:   04/07/16 0000 04/07/16 0400  BP: 126/75 113/74  Pulse: 77 95  Resp:  20  Temp: 37.4 C 37.3 C    Last Pain:  Vitals:   04/07/16 0400  TempSrc: Oral  PainSc:                  Laylani Pudwill J

## 2016-04-07 NOTE — Care Management Note (Addendum)
Case Management Note  Patient Details  Name: Tyler Hart MRN: [id number] Date of Birth: 1960-09-08  Subjective/Objective: Patient s/p total knee, plans to return home, lives with his fiance.                  Action/Plan: HHPT, 3in1, RW has all been ordered with [facility]. LInda Lothain of [facility] notified and will obtain orders from chart. Per [employer], CPM has been ordered. Patient states CPM should be delivered today. NO other CM needs.    Expected Discharge Date:  04/08/16               Expected Discharge Plan:  [place]  In-House Referral:  NA  Discharge planning Services  CM Consult  Post Acute Care Choice:  Durable Medical Equipment, Home Health Choice offered to:  Patient  DME Arranged:  3-N-1, Walker rolling DME Agency:  [place]:  PT [facility] Agency:  [place]  Status of Service:  Completed, signed off  If discussed at [place] of Stay Meetings, dates discussed:    Additional Comments:  Azayla Polo, Chauncey Reading, RN 04/07/2016, 11:32 AM

## 2016-04-07 NOTE — Progress Notes (Addendum)
Physical Therapy Treatment Patient Details Name: Tyler Hart MRN: [id number] DOB: Nov 03, 1960 Today's Date: 04/07/2016    History of Present Illness 55 yo M admitted 04/06/2016 for elective L TKA.  PMH: puncture wound, gout, L  L E I&D, knee surgery x 4.    PT Comments    Pt received in bed, and was agreeable to PT tx.  Pt demonstrates good technique with all LE exercises, but requires assist initially for SAQ's.  Pt able to ambulate 178f with RW and Mod Independent.  He also negotiated 5 steps at Mod independent level.  Pt has met all acute PT goals and is cleared to d/c home with HHPT, RW, and BSC.    Follow Up Recommendations  Home health PT     Equipment Recommendations  Rolling walker with 5" wheels;3in1 (PT)    Recommendations for Other Services       Precautions / Restrictions Precautions Precautions: Knee Restrictions LLE Weight Bearing: Weight bearing as tolerated    Mobility  Bed Mobility Overal bed mobility: Modified Independent             General bed mobility comments: Educated pt to cross R LE under the L LE to advance it to the EOB and bring it back into the bed.   Transfers Overall transfer level: Modified independent Equipment used: Rolling walker (2 wheeled) Transfers: Sit to/from Stand Sit to Stand: Modified independent (Device/Increase time)            Ambulation/Gait Ambulation/Gait assistance: Modified independent (Device/Increase time) Ambulation Distance (Feet): 150 Feet Assistive device: Rolling walker (2 wheeled) Gait Pattern/deviations: Step-to pattern;Trunk flexed     General Gait Details: Poor terminal knee extension, excessive trunk extension to advance L LE fwd.  Pt demonstrates increased WB through UE's.  Educated to use RW for balance and to allow L LE to take the weight.     Stairs Stairs: Yes Stairs assistance: Modified independent (Device/Increase time) Stair Management: Two rails;Step to  pattern;Forwards Number of Stairs: 5 General stair comments: Pt educated to use his crutches at home on the steps.  Pt has been using them prior to admission.   Wheelchair Mobility    Modified Rankin (Stroke Patients Only)       Balance           Standing balance support: Bilateral upper extremity supported Standing balance-Leahy Scale: Fair                      Cognition Arousal/Alertness: Awake/alert Behavior During Therapy: WFL for tasks assessed/performed Overall Cognitive Status: Within Functional Limits for tasks assessed                      Exercises Total Joint Exercises Ankle Circles/Pumps: AROM;Both;5 reps;Supine Quad Sets: Strengthening;Both;5 reps;Supine Gluteal Sets: Strengthening;Both;5 reps;Supine Short Arc Quad: Strengthening;Left;5 reps;Supine with Min A initially, but then independent Heel Slides: Strengthening;Left;5 reps;Supine Goniometric ROM: L knee extension 8* - 76* flexion.  Limited due to pain and muscle guarding.     General Comments        Pertinent Vitals/Pain Pain Score: 6  Pain Location: L knee Pain Descriptors / Indicators: Aching Pain Intervention(s): Limited activity within patient's tolerance;Monitored during session;Relaxation;Ice applied    Home Living                      Prior Function            PT Goals (current goals can  now be found in the care plan section) Acute Rehab PT Goals Patient Stated Goal: Pt wants to have less pain and walk normal PT Goal Formulation: With patient Time For Goal Achievement: 04/14/16 Potential to Achieve Goals: Good Progress towards PT goals: Progressing toward goals    Frequency  BID    PT Plan Current plan remains appropriate    Co-evaluation             End of Session Equipment Utilized During Treatment: Gait belt Activity Tolerance: Patient tolerated treatment well Patient left: with call bell/phone within reach;in bed;in CPM     Time:  1355-1430 PT Time Calculation (min) (ACUTE ONLY): 35 min  Charges:  $Gait Training: 8-22 mins $Therapeutic Exercise: 8-22 mins                    G Codes:      Beth Elver Stadler, PT, DPT X: [phone]

## 2016-04-07 NOTE — Evaluation (Signed)
Occupational Therapy Evaluation Patient Details Name: Tyler Hart MRN: [id number] DOB: September 21, 1960 Today's Date: 04/07/2016    History of Present Illness 55 yo M admitted 04/06/2016 for elective L TKA.  PMH: puncture wound, gout, L  L E I&D, knee surgery x 4.   Clinical Impression   Pt awake, alert, oriented this am, agreeable to OT evaluation. Pt demonstrates independence in bed mobility, min guard for functional mobility. Pt may require assistance for LB dressing in the immediate future due to pain and limited ROM in LLE. Pt verbalizes understanding with OT education, will return home with wife. Wife works during the day, however pt reports he has good support system to call if necessary. No further OT services required at this time.     Follow Up Recommendations  No OT follow up;Supervision - Intermittent    Equipment Recommendations  None recommended by OT       Precautions / Restrictions Precautions Precautions: Knee Restrictions Weight Bearing Restrictions: Yes LLE Weight Bearing: Weight bearing as tolerated      Mobility Bed Mobility Overal bed mobility: Independent                Transfers Overall transfer level: Needs assistance Equipment used: Rolling walker (2 wheeled) Transfers: Sit to/from [employer] Sit to Stand: Min guard Stand pivot transfers: Min guard            Balance Overall balance assessment: Needs assistance           Standing balance-Leahy Scale: Fair                              ADL Overall ADL's : Needs assistance/impaired                     Lower Body Dressing: Moderate assistance;Sitting/lateral leans (donning left sock)   Toilet Transfer: Supervision/safety;Cueing for sequencing;RW   Toileting- Clothing Manipulation and Hygiene: Modified independent       Functional mobility during ADLs: Min guard;Rolling walker       Vision Vision Assessment?: No apparent visual  deficits          Pertinent Vitals/Pain Pain Assessment: 0-10 Pain Score: 7  Pain Location: left knee Pain Descriptors / Indicators: Aching Pain Intervention(s): Limited activity within patient's tolerance;Monitored during session;Premedicated before session     Hand Dominance Right   Extremity/Trunk Assessment Upper Extremity Assessment Upper Extremity Assessment: Overall WFL for tasks assessed   Lower Extremity Assessment Lower Extremity Assessment: Defer to PT evaluation LLE Deficits / Details: Grossly 3/5       Communication Communication Communication: No difficulties   Cognition Arousal/Alertness: Awake/alert Behavior During Therapy: WFL for tasks assessed/performed Overall Cognitive Status: Within Functional Limits for tasks assessed                                Home Living Family/patient expects to be discharged to:: Private residence Living Arrangements: Spouse/significant other (works during the day) Available Help at Discharge: Friend(s);Family Type of Home: House Home Access: Stairs to enter [employer] of Steps: 4 no HR   Home Layout: One level     Bathroom Shower/Tub: Teacher, early years/pre: Standard     Home Equipment: Crutches          Prior Functioning/Environment Level of Independence: Independent with assistive device(s)  Gait / Transfers Assistance Needed: Sometimes  using the crutches - when he has bad gout ADL's / Homemaking Assistance Needed: Pt was independent with ADL's, and IADL's and driving.    Comments: Currently not working.     OT Diagnosis: Acute pain   OT Problem List: Pain   OT Treatment/Interventions:      OT Goals(Current goals can be found in the care plan section) Acute Rehab OT Goals Patient Stated Goal: Pt wants to have less pain and walk normal  OT Frequency:      End of Session Equipment Utilized During Treatment: Gait belt;Rolling walker  Activity Tolerance: Patient  tolerated treatment well Patient left: in chair;with call bell/phone within reach   Time: 0901-0917 OT Time Calculation (min): 16 min Charges:  OT General Charges $OT Visit: 1 Procedure OT Evaluation $OT Eval Low Complexity: 1 Procedure  Guadelupe Sabin, OTR/L  [phone] 04/07/2016, 10:41 AM

## 2016-04-07 NOTE — Addendum Note (Signed)
Addendum  created 04/07/16 1000 by Charmaine Downs, CRNA   Sign clinical note

## 2016-04-07 NOTE — Progress Notes (Signed)
Patient refused cryocuff during night shift as it caused him discomfort and did not like the cool feeling on his knee.

## 2016-04-07 NOTE — Clinical Social Work Note (Signed)
CSW received consult for possible SNF. PT evaluated pt and recommend home health. CSW will sign off, but can be reconsulted if needed.  Benay Pike, [place]

## 2016-04-07 NOTE — Progress Notes (Signed)
Patient ID: Tyler Hart, male   DOB: [birth year], 55 y.o.   MRN: [id number]  POD # 1  Status post LEFT total knee   VS BP 113/74 (BP Location: Left Arm)   Pulse 95   Temp 99.1 F (37.3 C) (Oral)   Resp 20   Ht 6\' 2"  (1.88 m)   Wt 231 lb (104.8 kg)   SpO2 97%   BMI 29.66 kg/m    LABS  CBC Latest Ref Rng & Units 04/07/2016 04/03/2016 01/28/2015  WBC 4.0 - 10.5 K/uL 11.4(H) 7.6 9.6  Hemoglobin 13.0 - 17.0 g/dL 12.7(L) 16.2 15.1  Hematocrit 39.0 - 52.0 % 38.5(L) 47.3 44.2  Platelets 150 - 400 K/uL 165 197 236   BMP Latest Ref Rng & Units 04/07/2016 04/03/2016 01/28/2015  Glucose 65 - 99 mg/dL 117(H) 75 102(H)  BUN 6 - 20 mg/dL 8 12 10   Creatinine 0.61 - 1.24 mg/dL 0.90 0.94 0.94  Sodium 135 - 145 mmol/L 136 134(L) 134(L)  Potassium 3.5 - 5.1 mmol/L 3.9 4.4 4.1  Chloride 101 - 111 mmol/L 104 101 102  CO2 22 - 32 mmol/L 27 27 25   Calcium 8.9 - 10.3 mg/dL 8.2(L) 9.5 9.5     DRESSING SCANT SANG DRAINANGE   Neuro-vasculo-motor status of operative limb NO CHANGE FROM PRE OP   Homans sign normal calf supple  Assessment status post total knee arthroplasty patient doing well  Plan continue physical therapy. Provide adequate pain control.

## 2016-04-08 LAB — CBC
HCT: 34.2 % — ABNORMAL LOW (ref 39.0–52.0)
Hemoglobin: 11.4 g/dL — ABNORMAL LOW (ref 13.0–17.0)
MCH: 32.7 pg (ref 26.0–34.0)
MCHC: 33.3 g/dL (ref 30.0–36.0)
MCV: 98 fL (ref 78.0–100.0)
Platelets: 158 10*3/uL (ref 150–400)
RBC: 3.49 MIL/uL — ABNORMAL LOW (ref 4.22–5.81)
RDW: 13 % (ref 11.5–15.5)
WBC: 16.2 10*3/uL — ABNORMAL HIGH (ref 4.0–10.5)

## 2016-04-08 MED ORDER — BISACODYL 5 MG PO TBEC: 5.0000 mg | DELAYED_RELEASE_TABLET | Freq: Every day | ORAL | 0 refills | Status: DC | PRN

## 2016-04-08 MED ORDER — ASPIRIN 325 MG PO TBEC: 325.0000 mg | DELAYED_RELEASE_TABLET | Freq: Every day | ORAL | 0 refills | Status: DC

## 2016-04-08 MED ORDER — OXYCODONE-ACETAMINOPHEN 5-325 MG PO TABS: 1.0000 | ORAL_TABLET | ORAL | 0 refills | Status: DC

## 2016-04-08 NOTE — Discharge Summary (Signed)
  Physician Discharge Summary  Patient ID: BROCKTON REO MRN: [id number] DOB/AGE: Jan 29, 1961 55 y.o.  Admit date: 04/06/2016 Discharge date: 04/08/2016  Admission Diagnoses: Osteoarthritis left knee  Discharge Diagnoses: Osteoarthritis left knee Active Problems:   Primary osteoarthritis of left knee   Discharged Condition: good  Hospital Course:  Patient admitted on the [id number] for uncomplicated left total knee with a [place] stigma fixed-bearing posterior stabilized knee. He tolerated that well.   Postop day 1 he tolerated physical therapy well ambulating 100 feet or more pain was controlled  Postop day 03/16/2059 was stable for discharge       Discharge Exam: Blood pressure 119/69, pulse 70, temperature 98.2 F (36.8 C), temperature source Oral, resp. rate 20, height 6\' 2"  (1.88 m), weight 231 lb (104.8 kg), SpO2 98 %. Incision/Wound: Clean dry scant drainage  Mild knee swelling supple Homans sign negative.  Disposition: 01-Home or Self Care  Discharge Instructions    CPM    Complete by:  As directed   Continuous passive motion machine (CPM):      Use the CPM from 0 to 70 for 8 hours per day.      You may increase by 10 per day.  You may break it up into 2 or 3 sessions per day.      Use CPM for 2 weeks or until you are told to stop.   Call MD / Call 911    Complete by:  As directed   If you experience chest pain or shortness of breath, CALL 911 and be transported to the hospital emergency room.  If you develope a fever above 101 F, pus (white drainage) or increased drainage or redness at the wound, or calf pain, call your surgeon's office.   Constipation Prevention    Complete by:  As directed   Drink plenty of fluids.  Prune juice may be helpful.  You may use a stool softener, such as Colace (over the counter) 100 mg twice a day.  Use MiraLax (over the counter) for constipation as needed.   Diet - low sodium heart healthy    Complete by:  As directed   Do not put a  pillow under the knee. Place it under the heel.    Complete by:  As directed   Increase activity slowly as tolerated    Complete by:  As directed   TED hose    Complete by:  As directed   Use stockings (TED hose) for 2 weeks on both leg(s).  You may remove them at night for sleeping.       Medication List    STOP taking these medications   HYDROcodone-acetaminophen 5-325 MG tablet Commonly known as:  NORCO/VICODIN     TAKE these medications   aspirin 325 MG EC tablet Take 1 tablet (325 mg total) by mouth daily with breakfast.   bisacodyl 5 MG EC tablet Commonly known as:  DULCOLAX Take 1 tablet (5 mg total) by mouth daily as needed for moderate constipation.   oxyCODONE-acetaminophen 5-325 MG tablet Commonly known as:  PERCOCET/ROXICET Take 1 tablet by mouth every 4 (four) hours.        Signed: Arther Abbott 04/08/2016, 9:52 AM

## 2016-04-08 NOTE — Progress Notes (Signed)
Pt's IV catheter removed and intact. Pt's IV site clean dry and intact. Discharge instructions including medications and follow up appointments were reviewed and discussed with patient. Pt verbalized understanding of discharge instructions including medications and follow up appointments. All questions were answered and no further questions at this time. Pt in stable condition and in no acute distress at time of discharge. Pt escorted by nurse tech.  

## 2016-04-12 ENCOUNTER — Encounter (HOSPITAL_COMMUNITY): Payer: Self-pay | Admitting: Orthopedic Surgery

## 2016-04-12 LAB — TYPE AND SCREEN
ABO/RH(D): O POS
ANTIBODY SCREEN: NEGATIVE
Unit division: 0
Unit division: 0

## 2016-04-13 ENCOUNTER — Other Ambulatory Visit: Payer: Self-pay | Admitting: Orthopedic Surgery

## 2016-04-13 ENCOUNTER — Telehealth: Payer: Self-pay

## 2016-04-13 MED ORDER — CYCLOBENZAPRINE HCL 10 MG PO TABS: 10.0000 mg | ORAL_TABLET | Freq: Two times a day (BID) | ORAL | 1 refills | Status: DC | PRN

## 2016-04-13 NOTE — Telephone Encounter (Signed)
ROUTING TO DR HARRISON FOR APPROVAL 

## 2016-04-18 ENCOUNTER — Ambulatory Visit (INDEPENDENT_AMBULATORY_CARE_PROVIDER_SITE_OTHER): Payer: Medicaid Other | Admitting: Orthopedic Surgery

## 2016-04-18 VITALS — BP 105/75 | HR 87 | Ht 74.0 in | Wt 231.0 lb

## 2016-04-18 DIAGNOSIS — Z4789 Encounter for other orthopedic aftercare: Secondary | ICD-10-CM

## 2016-04-18 MED ORDER — CYCLOBENZAPRINE HCL 10 MG PO TABS: 10.0000 mg | ORAL_TABLET | Freq: Two times a day (BID) | ORAL | 1 refills | Status: DC | PRN

## 2016-04-18 MED ORDER — OXYCODONE-ACETAMINOPHEN 5-325 MG PO TABS: 1.0000 | ORAL_TABLET | ORAL | 0 refills | Status: DC

## 2016-04-18 NOTE — Progress Notes (Signed)
Patient ID: Tyler Hart, male   DOB: 05-06-1961, 55 y.o.   MRN: [id number]  Post op visit   Chief Complaint  Patient presents with  . Routine Post Op    Post Op 1 Left TKA DOS 04/06/16    BP 105/75   Pulse 87   Ht 6\' 2"  (1.88 m)   Wt 231 lb (104.8 kg)   BMI 29.66 kg/m   Knee flexion is 75  Wound clean  Staples removed  Percocet refilled  Follow-up 2 weeks  ASSESSMENT AND PLAN   Start therapy outpatient next week

## 2016-04-18 NOTE — Patient Instructions (Signed)
Start outpatient PT Mon sept 11

## 2016-04-21 ENCOUNTER — Ambulatory Visit (HOSPITAL_COMMUNITY): Payer: Medicaid Other | Attending: Orthopedic Surgery

## 2016-04-21 DIAGNOSIS — M25662 Stiffness of left knee, not elsewhere classified: Secondary | ICD-10-CM | POA: Diagnosis present

## 2016-04-21 DIAGNOSIS — M6281 Muscle weakness (generalized): Secondary | ICD-10-CM | POA: Diagnosis present

## 2016-04-21 DIAGNOSIS — M25562 Pain in left knee: Secondary | ICD-10-CM
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
## 2016-05-02 ENCOUNTER — Ambulatory Visit (INDEPENDENT_AMBULATORY_CARE_PROVIDER_SITE_OTHER): Payer: Medicaid Other | Admitting: Orthopedic Surgery

## 2016-05-02 VITALS — BP 127/85 | HR 89 | Wt 225.0 lb

## 2016-05-02 DIAGNOSIS — Z4789 Encounter for other orthopedic aftercare: Secondary | ICD-10-CM

## 2016-05-02 DIAGNOSIS — Z96652 Presence of left artificial knee joint: Secondary | ICD-10-CM

## 2016-05-02 MED ORDER — HYDROCODONE-ACETAMINOPHEN 10-325 MG PO TABS: 1.0000 | ORAL_TABLET | ORAL | 0 refills | Status: DC | PRN

## 2016-05-02 NOTE — Progress Notes (Signed)
Post op visit  Chief Complaint  Patient presents with  . Follow-up    LEFT TKA, 04/06/16   BP 127/85   Pulse 89   Wt 225 lb (102.1 kg)   BMI 28.89 kg/m   Pod 26   Current Outpatient Prescriptions:  .  oxyCODONE-acetaminophen (PERCOCET/ROXICET) 5-325 MG tablet, Take 1 tablet by mouth every 4 (four) hours., Disp: 60 tablet, Rfl: 0   Meds ordered this encounter  Medications  . HYDROcodone-acetaminophen (NORCO) 10-325 MG tablet    Sig: Take 1 tablet by mouth every 4 (four) hours as needed.    Dispense:  42 tablet    Refill:  0    Knee flexion 85 The incision clean dry and intact  Continue therapy  Return in 4 weeks

## 2016-05-05 ENCOUNTER — Ambulatory Visit (HOSPITAL_COMMUNITY): Payer: Medicaid Other

## 2016-05-05 NOTE — Telephone Encounter (Signed)
No show, called and spoke to pt who stated he did not have a ride and thought the apt was scheduled for Monday.  Pt reminded next apt date and time and stated he would be there.  [address], [place]; [facility] [phone]

## 2016-05-14 ENCOUNTER — Encounter (HOSPITAL_COMMUNITY): Payer: Self-pay | Admitting: Emergency Medicine

## 2016-05-14 ENCOUNTER — Emergency Department (HOSPITAL_COMMUNITY)
Admission: EM | Admit: 2016-05-14 | Discharge: 2016-05-14 | Disposition: A | Discharge status: Home / Self Care | Payer: Medicaid Other | Attending: Emergency Medicine | Admitting: Emergency Medicine

## 2016-05-14 DIAGNOSIS — M25572 Pain in left ankle and joints of left foot: Secondary | ICD-10-CM | POA: Insufficient documentation

## 2016-05-14 DIAGNOSIS — M109 Gout, unspecified: Secondary | ICD-10-CM

## 2016-05-14 DIAGNOSIS — M10071 Idiopathic gout, right ankle and foot: Secondary | ICD-10-CM | POA: Insufficient documentation

## 2016-05-14 DIAGNOSIS — M79674 Pain in right toe(s): Secondary | ICD-10-CM | POA: Diagnosis present

## 2016-05-14 DIAGNOSIS — M549 Dorsalgia, unspecified: Secondary | ICD-10-CM | POA: Diagnosis not present

## 2016-05-14 MED ORDER — DEXAMETHASONE SODIUM PHOSPHATE 10 MG/ML IJ SOLN
10.0000 mg | Freq: Once | INTRAMUSCULAR | Status: AC
  Administered 2016-05-14: 10 mg via INTRAMUSCULAR
  Filled 2016-05-14: qty 1

## 2016-05-14 MED ORDER — PREDNISONE 10 MG PO TABS: ORAL_TABLET | ORAL | 0 refills | Status: DC
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
## 2016-05-14 NOTE — Discharge Instructions (Signed)
You have received the prednisone dose for today in the injection you received.  Start taking your prednisone taper tomorrow morning.

## 2016-05-15 ENCOUNTER — Telehealth (HOSPITAL_COMMUNITY): Payer: Self-pay

## 2016-05-17 ENCOUNTER — Ambulatory Visit (HOSPITAL_COMMUNITY): Payer: No Typology Code available for payment source | Attending: Orthopedic Surgery | Admitting: Physical Therapy

## 2016-05-17 DIAGNOSIS — M6281 Muscle weakness (generalized): Secondary | ICD-10-CM | POA: Insufficient documentation

## 2016-05-17 DIAGNOSIS — M25662 Stiffness of left knee, not elsewhere classified: Secondary | ICD-10-CM | POA: Insufficient documentation

## 2016-05-17 DIAGNOSIS — M25562 Pain in left knee: Secondary | ICD-10-CM | POA: Insufficient documentation

## 2016-05-17 NOTE — Patient Instructions (Signed)
   QUAD SET  Tighten your top thigh muscle as you attempt to press the back of your knee downward towards the table.   Hold for 5 seconds and relax.  Repeat 15 times, at least 3 times per day.    KNEE FLEXION STRETCH - SELF ASSISTED  While seated in a chair, use your unaffected leg (the right) to bend your affected knee (the left) until a stretch is felt.  Hold for 5 seconds, and repeat 15 times at least 3 times per day.

## 2016-05-17 NOTE — Therapy (Signed)
[place], [place], [address] Phone: [phone]   Fax:  [phone]  Patient Details  Name: Tyler Hart MRN: [id number] Date of Birth: [birth year] Referring Provider:  Carole Civil, MD  Encounter Date: 05/17/2016         [facility] PT Assessment - 05/17/16 0001      AROM   Left Knee Extension 7   Left Knee Flexion 100     Strength   Right Knee Flexion 4/5   Right Knee Extension 4/5   Left Knee Flexion 4/5   Left Knee Extension 5/5     Transfers   Five time sit to stand comments  12.5 no UEs           Patient arrives today for treatment; DPT checked Medicaid and noted that it had been denied due to request for more information not being answered. Upon rehab supervisor direction, performed functional tests and measures and will re-submit to Medicaid to attempt to get patient more appointments from [facility]. Educated patient to schedule one appt about 10 days out, no news is good news and we will call if Medicaid does not approve more visits.    No charge for today's session.   Deniece Ree PT, DPT [phone]  [facility] [address] [place], [place], [address] Phone: [phone]   Fax:  [phone]

## 2016-05-19 ENCOUNTER — Encounter (HOSPITAL_COMMUNITY): Payer: Medicaid Other

## 2016-05-23 ENCOUNTER — Telehealth (HOSPITAL_COMMUNITY): Payer: Self-pay | Admitting: Physical Therapy

## 2016-05-23 NOTE — Telephone Encounter (Signed)
Called patient and educated him that Medicaid has denied his most recent application for more PT sessions; advised him that he can get more information regarding [facility] financial assistance from our front desk, or he can call Medicaid to try to appeal. Patient gave verbal understanding of all education provided today.  Deniece Ree PT, DPT [phone]

## 2016-05-25 ENCOUNTER — Ambulatory Visit (HOSPITAL_COMMUNITY): Payer: No Typology Code available for payment source | Admitting: Physical Therapy

## 2016-05-30 ENCOUNTER — Ambulatory Visit (INDEPENDENT_AMBULATORY_CARE_PROVIDER_SITE_OTHER): Payer: Medicaid Other | Admitting: Orthopedic Surgery

## 2016-05-30 ENCOUNTER — Encounter: Payer: Self-pay | Admitting: Orthopedic Surgery

## 2016-05-30 DIAGNOSIS — Z96652 Presence of left artificial knee joint: Secondary | ICD-10-CM

## 2016-05-30 DIAGNOSIS — Z4789 Encounter for other orthopedic aftercare: Secondary | ICD-10-CM

## 2016-05-30 MED ORDER — HYDROCODONE-ACETAMINOPHEN 7.5-325 MG PO TABS: 1.0000 | ORAL_TABLET | Freq: Three times a day (TID) | ORAL | 0 refills | Status: DC | PRN

## 2016-05-30 NOTE — Progress Notes (Signed)
Patient ID: Tyler Hart, male   DOB: 21-Dec-1960, 55 y.o.   MRN: [id number]  Post op visit   Chief Complaint  Patient presents with  . Follow-up    LT TKA, DOS 04/06/16    Postop day 16  Mr. Phibbs continues to make progress. He has full extension of his left knee his incision healed well he complains of some mild medial pain. His knee flexion is 100. His therapy stop her Medicaid guidelines and is trying on his own. His preop flexion was just at 90 so is made some progress  Return in one month  Continue opioid taper. 7.5 mg every 8 #90 to last 30 days  Continue taper next visit.

## 2016-06-02 ENCOUNTER — Other Ambulatory Visit: Payer: Self-pay | Admitting: *Deleted

## 2016-06-02 MED ORDER — HYDROCODONE-ACETAMINOPHEN 7.5-325 MG PO TABS: 1.0000 | ORAL_TABLET | Freq: Three times a day (TID) | ORAL | 0 refills | Status: DC | PRN

## 2016-06-14 ENCOUNTER — Telehealth: Payer: Self-pay

## 2016-06-14 NOTE — Telephone Encounter (Signed)
Pt received a triage letter from [facility]. Please call [phone]

## 2016-06-20 ENCOUNTER — Telehealth: Payer: Self-pay

## 2016-06-20 NOTE — Telephone Encounter (Signed)
Pt called again today asking to have his colonoscopy set up. I told him that the triage nurse was off until Thursday and she was aware that he had called and would be in touch with him.

## 2016-06-23 ENCOUNTER — Telehealth: Payer: Self-pay

## 2016-06-23 NOTE — Telephone Encounter (Signed)
See triage

## 2016-06-23 NOTE — Telephone Encounter (Signed)
Triaged today.  

## 2016-06-26 NOTE — Telephone Encounter (Signed)
Gastroenterology Pre-Procedure Review  Request Date: 06/23/2016 Requesting Physician: Dr. Legrand Rams  PATIENT REVIEW QUESTIONS: The patient responded to the following health history questions as indicated:    1. Diabetes Melitis: no 2. Joint replacements in the past 12 months: no 3. Major health problems in the past 3 months: no 4. Has an artificial valve or MVP: no 5. Has a defibrillator: no 6. Has been advised in past to take antibiotics in advance of a procedure like teeth cleaning: no 7. Family history of colon cancer: no  8. Alcohol Use: 3 beers daily on the weekends 9. History of sleep apnea: no  10. History of coronary artery or other vascular stents placed within the last 12 months: no    MEDICATIONS & ALLERGIES:    Patient reports the following regarding taking any blood thinners:   Plavix? no Aspirin? no Coumadin? no Brilinta? no Xarelto? no Eliquis? no Pradaxa? no Savaysa? no Effient? no  Patient confirms/reports the following medications:  Current Outpatient Prescriptions  Medication Sig Dispense Refill  . HYDROcodone-acetaminophen (NORCO) 7.5-325 MG tablet Take 1 tablet by mouth every 8 (eight) hours as needed for moderate pain. 42 tablet 0   No current facility-administered medications for this visit.     Patient confirms/reports the following allergies:  No Known Allergies  No orders of the defined types were placed in this encounter.   AUTHORIZATION INFORMATION Primary Insurance:   ID #:   Group #: Pre-Cert / Auth required: Pre-Cert / Auth #:   Secondary Insurance:   ID #:  Group #:  Pre-Cert / Auth required:  Pre-Cert / Auth #:   SCHEDULE INFORMATION: Procedure has been scheduled as follows:  Date:             Time:  Location:   This Gastroenterology Pre-Precedure Review Form is being routed to the following provider(s): Barney Drain, MD

## 2016-06-28 NOTE — Telephone Encounter (Signed)
MOVI PREP SPLIT DOSING, FULL LIQUIDS WITH BREAKFAST.  Full Liquid Diet A high-calorie, high-protein supplement should be used to meet your nutritional requirements when the full liquid diet is continued for more than 2 or 3 days. If this diet is to be used for an extended period of time (more than 7 days), a multivitamin should be considered.  Breads and Starches  Allowed: None are allowed   Avoid: Any others.    Potatoes/Pasta/Rice  Allowed: ANY ITEM AS A SOUP OR SMALL PLATE OF MASHED POTATOES OR SCRAMBLED EGGS. (DO NOT EAT MORE THAN ONE SERVING ON THE DAY BEFORE COLONOSCOPY).      Vegetables  Allowed: Strained tomato or vegetable juice. Vegetables pureed in soup.   Avoid: Any others.    Fruit  Allowed: Any strained fruit juices and fruit drinks. Include 1 serving of citrus or vitamin C-enriched fruit juice daily.   Avoid: Any others.  Meat and Meat Substitutes  Allowed: Egg  Avoid: Any meat, fish, or fowl. All cheese.  Milk  Allowed: SOY Milk beverages, including milk shakes and instant breakfast mixes. Smooth yogurt.   Avoid: Any others. Avoid dairy products if not tolerated.    Soups and Combination Foods  Allowed: Broth, strained cream soups. Strained, broth-based soups.   Avoid: Any others.    Desserts and Sweets  Allowed: flavored gelatin, tapioca, ice cream, sherbet, smooth pudding, junket, fruit ices, frozen ice pops, pudding pops, frozen fudge pops, chocolate syrup. Sugar, honey, jelly, syrup.   Avoid: Any others.  Fats and Oils  Allowed: Margarine, butter, cream, sour cream, oils.   Avoid: Any others.  Beverages  Allowed: All.   Avoid: None.  Condiments  Allowed: Iodized salt, pepper, spices, flavorings. Cocoa powder.   Avoid: Any others.    SAMPLE MEAL PLAN Breakfast   cup orange juice.   1 OR 2 EGGS  1 cup milk.   1 cup beverage (coffee or tea).   Cream or sugar, if desired.    Midmorning Snack  2 SCRAMBLED OR HARD  BOILED EGG   Lunch  1 cup cream soup.    cup fruit juice.   1 cup milk.    cup custard.   1 cup beverage (coffee or tea).   Cream or sugar, if desired.    Midafternoon Snack  1 cup milk shake.  Dinner  1 cup cream soup.    cup fruit juice.   1 cup MILK    cup pudding.   1 cup beverage (coffee or tea).   Cream or sugar, if desired.  Evening Snack  1 cup supplement.  To increase calories, add sugar, cream, butter, or margarine if possible. Nutritional supplements will also increase the total calories. 

## 2016-06-29 ENCOUNTER — Other Ambulatory Visit: Payer: Self-pay

## 2016-06-29 DIAGNOSIS — Z1211 Encounter for screening for malignant neoplasm of colon: Secondary | ICD-10-CM

## 2016-06-29 MED ORDER — PEG-KCL-NACL-NASULF-NA ASC-C 100 G PO SOLR: 1.0000 | ORAL | 0 refills | Status: DC

## 2016-06-29 NOTE — Telephone Encounter (Signed)
Rx sent to the pharmacy and instructions mailed to pt.  

## 2016-06-30 ENCOUNTER — Encounter: Payer: Self-pay | Admitting: Orthopedic Surgery

## 2016-06-30 ENCOUNTER — Ambulatory Visit (INDEPENDENT_AMBULATORY_CARE_PROVIDER_SITE_OTHER): Payer: Medicaid Other | Admitting: Orthopedic Surgery

## 2016-06-30 DIAGNOSIS — Z96652 Presence of left artificial knee joint: Secondary | ICD-10-CM

## 2016-06-30 MED ORDER — HYDROCODONE-ACETAMINOPHEN 5-325 MG PO TABS: 1.0000 | ORAL_TABLET | Freq: Four times a day (QID) | ORAL | 0 refills | Status: DC | PRN

## 2016-06-30 NOTE — Patient Instructions (Addendum)
ADD IBUPROFEN 800 MG FOR THIGH PAIN    What You Need to Know About Prescription Opioid Pain Medicine Opioids are powerful medicines that are used to treat moderate to severe pain. Opioids should be taken with the supervision of a trained health care provider. They should be taken for the shortest period of time as possible. This is because opioids can be addictive and the longer you take opioids, the greater your risk of addiction (opioid use disorder). What do opioids do? Opioids help reduce or eliminate pain. When used for short periods of time, they can help you:  Sleep better.  Do better in physical or occupational therapy.  Feel better in the first few days after an injury.  Recover from surgery. What kind of problems can opioids cause? Opioids can cause side effects, such as:  Constipation.  Nausea.  Vomiting.  Drowsiness.  Confusion.  Opioid use disorder.  Breathing difficulties (respiratory depression). Using opioid pain medicines for longer than 3 days increases your risk of these side effects. Taking opioid pain medicine for a long period of time can affect your ability to do daily tasks. It also puts you at risk for:  Car accidents.  Heart attack.  Overdose, which can sometimes lead to death. What can increase my risk for developing problems while taking opioids? You may be at an especially high risk for problems while taking opioids if you:  Are over the age of 59.  Are pregnant.  Have kidney or liver disease.  Have certain mental health conditions, such as depression or anxiety.  Have a history of substance use disorder.  Have had an opioid overdose in the past. How do I stop taking opioids if I have been taking them for a long time? If you have been taking opioid medicine for more than a few weeks, you may need to slowly stop taking them (taper). Tapering your use of opioids can decrease your chances of experiencing withdrawal symptoms, such  as:  Abdominal pain and cramping.  Nausea.  Sweating.  Sleepiness.  Restlessness.  Uncontrollable shaking (tremors).  Cravings for the medicine. Do not attempt to taper your use of opioids on your own. Talk with your health care provider about how to do this. Your health care provider may prescribe a step-down schedule based on how much medicine you are taking and how long you have been taking it. What are the benefits of stopping the use of opioids? By switching from opioid pain medicine to non-opioid pain management options, you will decrease your risk of accidents and injuries associated with long-term opioid use. You will also be able to:  Monitor your pain more accurately and know when to seek medical care if it is not improving.  Decrease risk to others around you. Having opioids in the home increases the risk for accidental or intentional use or overdose by others. How can I treat pain without opioids? Pain can be managed with many types of alternative treatments. Ask your health care provider to refer you to one or more specialists who can help you manage pain through:  Physical or occupational therapy.  Counseling (cognitive-behavioral therapy).  Good nutrition.  Biofeedback.  Massage.  Meditation.  Non-opioid medicine.  Following a gentle exercise program. Where can I get support? If you have been taking opioids for a long time, you may benefit from receiving support for quitting from a local support group or counselor. Ask your health care provider for a referral to these resources in your area.  When should I seek medical care? Seek medical care right away if you are taking opioids and you experience any of the following:  Difficulty breathing.  Breathing that is more shallow or slower than normal.  A very slow heartbeat (pulse).  Severe confusion.  Unconsciousness.  Sleepiness.  Difficulty waking from sleep.  Slurred speech.  Nausea and  vomiting.  Cold, clammy skin.  Blue lips or fingernails.  Limpness.  Abnormally small pupils. If you think that you or someone else may have taken too much of an opioid medicine, get medical help right away. Do not wait to see if the symptoms go away on their own. Call your local emergency services (911 in the [place] or call the hotline of the [facility] 3861753718 in the [place].).  Where can I get more information? To learn more about opioid medicines, visit the Centers for Disease Control and Prevention web site Opioid Basics at https://keller-santana.com/. Summary  Opioid medicines can help you manage moderate-to-severe pain for a short period of time.  Taking opioid pain medicine for a long period of time puts you at risk for unintentional accidents, injury, and even death.  If you think that you or someone else may have taken too much of an opioid, get medical help right away. This information is not intended to replace advice given to you by your health care provider. Make sure you discuss any questions you have with your health care provider. Document Released: 08/27/2015 Document Revised: 03/24/2016 Document Reviewed: 03/12/2015 Elsevier Interactive Patient Education  2017 [employer].

## 2016-06-30 NOTE — Progress Notes (Signed)
Patient ID: Tyler Hart, male   DOB: 01/14/1961, 55 y.o.   MRN: [id number]  Post op visit   Chief Complaint  Patient presents with  . Follow-up    LFT TKA, DOS 04/06/16   3 months after total knee on the left current range of motion is 0-105  Patient is a pain over the medial aspect of the knee and the thigh. He is currently on Norco 7.5 mg for pain  He has tenderness in the 2 areas described 1 over the medial bursal area including hamstring tendons and 1 over the proximal extent of the tendon incision  Recommend ibuprofen, decrease opiate load  Follow-up in 2 months discuss possible right total knee.[facility] ordered this encounter  Medications  . HYDROcodone-acetaminophen (NORCO) 5-325 MG tablet    Sig: Take 1 tablet by mouth every 6 (six) hours as needed for moderate pain.    Dispense:  30 tablet    Refill:  0

## 2016-07-25 ENCOUNTER — Telehealth: Payer: Self-pay

## 2016-07-25 NOTE — Telephone Encounter (Signed)
I called pt to update med list prior to colonoscopy and he has not had any changes since he was triaged.

## 2016-07-31 ENCOUNTER — Ambulatory Visit (HOSPITAL_COMMUNITY): Admission: RE | Admit: 2016-07-31 | Payer: Medicaid Other | Source: Ambulatory Visit | Admitting: Gastroenterology

## 2016-07-31 ENCOUNTER — Encounter (HOSPITAL_COMMUNITY): Admission: RE | Payer: Self-pay | Source: Ambulatory Visit

## 2016-07-31 SURGERY — COLONOSCOPY
Anesthesia: Moderate Sedation

## 2016-08-01 ENCOUNTER — Telehealth: Payer: Self-pay | Admitting: Orthopedic Surgery

## 2016-08-01 DIAGNOSIS — Z96652 Presence of left artificial knee joint: Secondary | ICD-10-CM

## 2016-08-01 MED ORDER — HYDROCODONE-ACETAMINOPHEN 5-325 MG PO TABS: 1.0000 | ORAL_TABLET | Freq: Four times a day (QID) | ORAL | 0 refills | Status: DC | PRN

## 2016-08-01 NOTE — Telephone Encounter (Signed)
Patient states out of medication, requests refill:  HYDROcodone-acetaminophen (NORCO) 5-325 MG tablet 30 tablet

## 2016-08-01 NOTE — Telephone Encounter (Signed)
Routing to Dr Harrison for approval 

## 2016-08-14 DIAGNOSIS — B9681 Helicobacter pylori [H. pylori] as the cause of diseases classified elsewhere: Secondary | ICD-10-CM

## 2016-08-14 HISTORY — DX: Helicobacter pylori (H. pylori) as the cause of diseases classified elsewhere: B96.81

## 2016-08-17 ENCOUNTER — Telehealth: Payer: Self-pay | Admitting: Gastroenterology

## 2016-08-17 NOTE — Telephone Encounter (Signed)
Dr Josephine Cables office called to say that patient had been scheduled an colonoscopy and had to cancel due to some meds he was on. Patient is wanting to reschedule his procedure now. I told her that the triage nurse would be calling him back. [phone] or [phone]

## 2016-08-21 ENCOUNTER — Telehealth: Payer: Self-pay
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
## 2016-08-21 NOTE — Telephone Encounter (Signed)
See separate triage.  Pt was previously scheduled and having problems with his teeth. He was with his Dad at the hospital, ( His dad is in ICU).

## 2016-08-22 ENCOUNTER — Other Ambulatory Visit: Payer: Self-pay

## 2016-08-22 DIAGNOSIS — Z1211 Encounter for screening for malignant neoplasm of colon: Secondary | ICD-10-CM

## 2016-08-22 MED ORDER — PEG-KCL-NACL-NASULF-NA ASC-C 100 G PO SOLR: 1.0000 | ORAL | 0 refills | Status: DC

## 2016-08-22 NOTE — Telephone Encounter (Signed)
Pt is scheduled for 09/08/2016 at 10:15 Am with Dr. Oneida Alar. Rx sent to the pharmacy and instructions mailed to pt.

## 2016-08-22 NOTE — Telephone Encounter (Signed)
MOVI PREP SPLIT DOSING-FULL LIQUIDS WITH BREAKFAST.  Full Liquid Diet A high-calorie, high-protein supplement should be used to meet your nutritional requirements when the full liquid diet is continued for more than 2 or 3 days. If this diet is to be used for an extended period of time (more than 7 days), a multivitamin should be considered.  Breads and Starches  Allowed: None are allowed   Avoid: Any others.    Potatoes/Pasta/Rice  Allowed: ANY ITEM AS A SOUP OR SMALL PLATE OF MASHED POTATOES OR SCRAMBLED EGGS. (DO NOT EAT MORE THAN ONE SERVING ON THE DAY BEFORE COLONOSCOPY).      Vegetables  Allowed: Strained tomato or vegetable juice. Vegetables pureed in soup.   Avoid: Any others.    Fruit  Allowed: Any strained fruit juices and fruit drinks. Include 1 serving of citrus or vitamin C-enriched fruit juice daily.   Avoid: Any others.  Meat and Meat Substitutes  Allowed: Egg  Avoid: Any meat, fish, or fowl. All cheese.  Milk  Allowed: SOY Milk beverages, including milk shakes and instant breakfast mixes. Smooth yogurt.   Avoid: Any others. Avoid dairy products if not tolerated.    Soups and Combination Foods  Allowed: Broth, strained cream soups. Strained, broth-based soups.   Avoid: Any others.    Desserts and Sweets  Allowed: flavored gelatin, tapioca, ice cream, sherbet, smooth pudding, junket, fruit ices, frozen ice pops, pudding pops, frozen fudge pops, chocolate syrup. Sugar, honey, jelly, syrup.   Avoid: Any others.  Fats and Oils  Allowed: Margarine, butter, cream, sour cream, oils.   Avoid: Any others.  Beverages  Allowed: All.   Avoid: None.  Condiments  Allowed: Iodized salt, pepper, spices, flavorings. Cocoa powder.   Avoid: Any others.    SAMPLE MEAL PLAN Breakfast   cup orange juice.   1 OR 2 EGGS  1 cup milk.   1 cup beverage (coffee or tea).   Cream or sugar, if desired.    Midmorning Snack  2 SCRAMBLED OR HARD  BOILED EGG   Lunch  1 cup cream soup.    cup fruit juice.   1 cup milk.    cup custard.   1 cup beverage (coffee or tea).   Cream or sugar, if desired.    Midafternoon Snack  1 cup milk shake.  Dinner  1 cup cream soup.    cup fruit juice.   1 cup MILK    cup pudding.   1 cup beverage (coffee or tea).   Cream or sugar, if desired.  Evening Snack  1 cup supplement.  To increase calories, add sugar, cream, butter, or margarine if possible. Nutritional supplements will also increase the total calories.  

## 2016-08-29 ENCOUNTER — Ambulatory Visit: Payer: Medicaid Other | Admitting: Orthopedic Surgery

## 2016-09-05 ENCOUNTER — Encounter: Payer: Self-pay | Admitting: Orthopedic Surgery

## 2016-09-05 ENCOUNTER — Ambulatory Visit (INDEPENDENT_AMBULATORY_CARE_PROVIDER_SITE_OTHER): Payer: Medicaid Other

## 2016-09-05 ENCOUNTER — Ambulatory Visit (INDEPENDENT_AMBULATORY_CARE_PROVIDER_SITE_OTHER): Payer: Medicaid Other | Admitting: Orthopedic Surgery

## 2016-09-05 VITALS — BP 142/100 | HR 85 | Wt 225.0 lb

## 2016-09-05 DIAGNOSIS — M25561 Pain in right knee: Secondary | ICD-10-CM | POA: Diagnosis not present

## 2016-09-05 DIAGNOSIS — Z96652 Presence of left artificial knee joint: Secondary | ICD-10-CM

## 2016-09-05 DIAGNOSIS — G8929 Other chronic pain: Secondary | ICD-10-CM | POA: Diagnosis not present

## 2016-09-05 MED ORDER — HYDROCODONE-ACETAMINOPHEN 5-325 MG PO TABS: 1.0000 | ORAL_TABLET | Freq: Three times a day (TID) | ORAL | 0 refills | Status: DC | PRN
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
## 2016-09-06 ENCOUNTER — Telehealth: Payer: Self-pay | Admitting: Gastroenterology

## 2016-09-06 NOTE — Telephone Encounter (Signed)
Pt called at 1215 pm asking to speak with SF. I told him that SF was at lunch and I could take a message. He had 2 teeth pulled today and was put on pain meds. He wants to know would that have an effect on him having his procedure Friday. Please call him back at [phone]

## 2016-09-06 NOTE — Telephone Encounter (Signed)
PT called and said he is having 2 teeth pulled and will be on antibiotics ( Amoxicillin) for several days. Also he will be taking Hydrocodone since having the teeth pulled and also for his knee ( expecting to have another knee replacement soon). He wants to know if he should cancel and reschedule his procedure later. Please advise!

## 2016-09-06 NOTE — Telephone Encounter (Signed)
LMOM to call.

## 2016-09-07 ENCOUNTER — Other Ambulatory Visit: Payer: Self-pay | Admitting: *Deleted

## 2016-09-07 NOTE — Telephone Encounter (Signed)
REVIEWED-NO ADDITIONAL RECOMMENDATIONS. 

## 2016-09-07 NOTE — Telephone Encounter (Signed)
Per Tretha Sciara, pt called and cancelled the procedure due to a death in the family.

## 2016-09-08 ENCOUNTER — Ambulatory Visit (HOSPITAL_COMMUNITY): Admission: RE | Admit: 2016-09-08 | Payer: Medicaid Other | Source: Ambulatory Visit | Admitting: Gastroenterology

## 2016-09-08 ENCOUNTER — Encounter (HOSPITAL_COMMUNITY): Admission: RE | Payer: Self-pay | Source: Ambulatory Visit

## 2016-09-08 SURGERY — COLONOSCOPY
Anesthesia: Moderate Sedation

## 2016-09-18 ENCOUNTER — Encounter: Payer: Self-pay | Admitting: Gastroenterology

## 2016-10-04 ENCOUNTER — Telehealth: Payer: Self-pay | Admitting: *Deleted

## 2016-10-04 NOTE — Telephone Encounter (Signed)
ROUTING TO DR HARRISON FOR APPROVAL 

## 2016-10-04 NOTE — Telephone Encounter (Signed)
REQUESTING REFILL ON HYDROCODONE 5/325

## 2016-10-06 ENCOUNTER — Other Ambulatory Visit: Payer: Self-pay | Admitting: Orthopedic Surgery

## 2016-10-06 DIAGNOSIS — Z96652 Presence of left artificial knee joint: Secondary | ICD-10-CM

## 2016-10-06 MED ORDER — HYDROCODONE-ACETAMINOPHEN 5-325 MG PO TABS: 1.0000 | ORAL_TABLET | Freq: Three times a day (TID) | ORAL | 0 refills | Status: DC | PRN

## 2016-10-06 NOTE — Progress Notes (Signed)
[place] controlled substance reporting system reviewed  

## 2016-10-11 ENCOUNTER — Other Ambulatory Visit: Payer: Self-pay

## 2016-10-11 ENCOUNTER — Other Ambulatory Visit: Payer: Self-pay | Admitting: *Deleted

## 2016-10-11 ENCOUNTER — Encounter: Payer: Self-pay | Admitting: Gastroenterology

## 2016-10-11 ENCOUNTER — Ambulatory Visit (INDEPENDENT_AMBULATORY_CARE_PROVIDER_SITE_OTHER): Payer: Medicaid Other | Admitting: Gastroenterology

## 2016-10-11 DIAGNOSIS — D126 Benign neoplasm of colon, unspecified: Secondary | ICD-10-CM | POA: Insufficient documentation

## 2016-10-11 DIAGNOSIS — Z96652 Presence of left artificial knee joint: Secondary | ICD-10-CM

## 2016-10-11 DIAGNOSIS — Z1211 Encounter for screening for malignant neoplasm of colon: Secondary | ICD-10-CM

## 2016-10-11 DIAGNOSIS — B182 Chronic viral hepatitis C: Secondary | ICD-10-CM | POA: Diagnosis not present

## 2016-10-11 MED ORDER — PEG 3350-KCL-NA BICARB-NACL 420 G PO SOLR: 4000.0000 mL | ORAL | 0 refills | Status: DC

## 2016-10-11 MED ORDER — HYDROCODONE-ACETAMINOPHEN 5-325 MG PO TABS: 1.0000 | ORAL_TABLET | Freq: Three times a day (TID) | ORAL | 0 refills | Status: DC | PRN

## 2016-10-11 NOTE — Addendum Note (Signed)
Addended by: Danie Binder on: 10/11/2016 03:12 PM   Modules accepted: Orders

## 2016-10-11 NOTE — Telephone Encounter (Signed)
APPROVED

## 2016-10-11 NOTE — Assessment & Plan Note (Addendum)
IN PT WITH PRIOR HISTORY OF SNORTING COCAINE & DAILY ETOH USE.  OBTAIN HEP C GENOTYPE WITH REFLEX. DISCUSSED WITH PT HE ALSO NEEDS AND HIV TEST. ADVISED PT TO TAPER OFF DAILY ETOH USE WILL NEED FIBROSURE AND NEEDS HEP A/B SEROLOGIES. IF HEP C INFECTION NOT RESOLVED, WILL NEED EGD. DISCUSSED PROCEDURE, BENEFITS, & RISKS: < 1% chance of medication reaction, OR bleeding. EXPLAINED TO PT EVEN IF CURED FROM HEP C HE CAN GET IT AGAIN BUT INSURANCE COMPANIES ONLY PAY FOR TREATMENT ONCE. HEP C PRECAUTIONS GIVEN CONTACT PRIOR SEXUAL PARTNERS AND ASK THEM TO GET TESTED. FOLLOW UP IN 2 MOS.
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
## 2016-10-11 NOTE — Progress Notes (Signed)
MADE APPOINTMENT FOR PATIENT

## 2016-10-11 NOTE — Assessment & Plan Note (Signed)
AVERAGE RISK-NO WARNING SIGNS/SYMPTOMS  SCREENING TCS W/ MAC DUE TO DAILY ETOH AND PRIOR RECREATIONAL DRUG USE.DISCUSSED PROCEDURE, BENEFITS, & RISKS: < 1% chance of medication reaction, bleeding, perforation, or rupture of spleen/liver.

## 2016-10-11 NOTE — Patient Instructions (Addendum)
HOME CARE INSTRUCTIONS TO AVOID INFECTING FAMILY & FRIENDS WITH HEPATITIS C Rest when you feel tired, and eat when you are hungry.  Avoid a sexual relationship until advised otherwise by your caregiver.  Avoid activities that could expose other people to your blood. Examples include sharing a toothbrush, nail clippers, razors, and needles.  DO NOT SHARE CUPS OR BOTTLES WITH OTHERS. AVOID FRENCH KISSING ANYONE UNTIL YOU KNOW THE VIRUS HAS CLEARED. Do not take any medicines until your caregiver says it is okay. This includes over-the-counter drugs such as acetominophen that are usually taken for fever or pain.     COMPLETE LABS.  CUT DOWN ON YOUR ALCOHOL USE. IF YOU HAVE HEPATITIS C YOU WILL NEED TO STOP DURING YOUR TREATMENT.  IF YOU STILL HAVE HEP C INFECTION, YOU WILL NEED AN UPPER ENDOSCOPY.   CONTACT PRIOR SEXUAL PARTNERS AND ASK THEM TO GET TESTED.  COMPLETE YOUR COLONOSCOPY IN 2-3 WEEKS. FOLLOW A FULL LIQUID DIET ION THE DAY BEFORE YOUR COLONOSCOPY. SEE INFO BELOW.  FOLLOW UP IN 2 MOS.    Full Liquid Diet A high-calorie, high-protein supplement should be used to meet your nutritional requirements when the full liquid diet is continued for more than 2 or 3 days. If this diet is to be used for an extended period of time (more than 7 days), a multivitamin should be considered.  Breads and Starches  Allowed: None are allowed   Avoid: Any others.    Potatoes/Pasta/Rice  Allowed: ANY ITEM AS A SOUP OR SMALL PLATE OF MASHED POTATOES OR SCRAMBLED EGGS. (DO NOT EAT MORE THAN ONE SERVING ON THE DAY BEFORE COLONOSCOPY).      Vegetables  Allowed: Strained tomato or vegetable juice. Vegetables pureed in soup.   Avoid: Any others.    Fruit  Allowed: Any strained fruit juices and fruit drinks. Include 1 serving of citrus or vitamin C-enriched fruit juice daily.   Avoid: Any others.  Meat and Meat Substitutes  Allowed: Egg  Avoid: Any meat, fish, or fowl. All cheese.   Milk  Allowed: SOY Milk beverages, including milk shakes and instant breakfast mixes. Smooth yogurt.   Avoid: Any others. Avoid dairy products if not tolerated.    Soups and Combination Foods  Allowed: Broth, strained cream soups. Strained, broth-based soups.   Avoid: Any others.    Desserts and Sweets  Allowed: flavored gelatin, tapioca, ice cream, sherbet, smooth pudding, junket, fruit ices, frozen ice pops, pudding pops, frozen fudge pops, chocolate syrup. Sugar, honey, jelly, syrup.   Avoid: Any others.  Fats and Oils  Allowed: Margarine, butter, cream, sour cream, oils.   Avoid: Any others.  Beverages  Allowed: All.   Avoid: None.  Condiments  Allowed: Iodized salt, pepper, spices, flavorings. Cocoa powder.   Avoid: Any others.    SAMPLE MEAL PLAN Breakfast   cup orange juice.   1 OR 2 EGGS  1 cup milk.   1 cup beverage (coffee or tea).   Cream or sugar, if desired.    Midmorning Snack  2 SCRAMBLED OR HARD BOILED EGG   Lunch  1 cup cream soup.    cup fruit juice.   1 cup milk.    cup custard.   1 cup beverage (coffee or tea).   Cream or sugar, if desired.    Midafternoon Snack  1 cup milk shake.  Dinner  1 cup cream soup.    cup fruit juice.   1 cup MILK    cup pudding.  1 cup beverage (coffee or tea).   Cream or sugar, if desired.  Evening Snack  1 cup supplement.  To increase calories, add sugar, cream, butter, or margarine if possible. Nutritional supplements will also increase the total calories.

## 2016-10-12 NOTE — Progress Notes (Signed)
cc'ed to pcp °

## 2016-10-16 ENCOUNTER — Telehealth: Payer: Self-pay | Admitting: *Deleted

## 2016-10-16 NOTE — Telephone Encounter (Signed)
Patient called and states he wishes to postpone right total knee surgery for a few months. He reports he is still having some trouble with his left knee. He states he will call back when ready to schedule.   Hospital scheduling was advised and reminded to cancel [employer] rep.

## 2016-10-23 NOTE — Patient Instructions (Signed)
Tyler Hart  10/23/2016     @PREFPERIOPPHARMACY @   Your procedure is scheduled on  10/31/2016   Report to [facility] at  1000  A.M.  Call this number if you have problems the morning of surgery:  [phone]   Remember:  Do not eat food or drink liquids after midnight.  Take these medicines the morning of surgery with A SIP OF WATER  Hydrocodone.   Do not wear jewelry, make-up or nail polish.  Do not wear lotions, powders, or perfumes, or deoderant.  Do not shave 48 hours prior to surgery.  Men may shave face and neck.  Do not bring valuables to the hospital.  [facility] is not responsible for any belongings or valuables.  Contacts, dentures or bridgework may not be worn into surgery.  Leave your suitcase in the car.  After surgery it may be brought to your room.  For patients admitted to the hospital, discharge time will be determined by your treatment team.  Patients discharged the day of surgery will not be allowed to drive home.   Name and phone number of your driver:   family Special instructions:  Follow the diet and prep instructions given to you by Dr Nona Dell office.  Please read over the following fact sheets that you were given. Anesthesia Post-op Instructions and Care and Recovery After Surgery       Colonoscopy, Adult A colonoscopy is an exam to look at the entire large intestine. During the exam, a lubricated, bendable tube is inserted into the anus and then passed into the rectum, colon, and other parts of the large intestine. A colonoscopy is often done as a part of normal colorectal screening or in response to certain symptoms, such as anemia, persistent diarrhea, abdominal pain, and blood in the stool. The exam can help screen for and diagnose medical problems, including:  Tumors.  Polyps.  Inflammation.  Areas of bleeding. Tell a health care provider about:  Any allergies you have.  All medicines you are taking, including  vitamins, herbs, eye drops, creams, and over-the-counter medicines.  Any problems you or family members have had with anesthetic medicines.  Any blood disorders you have.  Any surgeries you have had.  Any medical conditions you have.  Any problems you have had passing stool. What are the risks? Generally, this is a safe procedure. However, problems may occur, including:  Bleeding.  A tear in the intestine.  A reaction to medicines given during the exam.  Infection (rare). What happens before the procedure? Eating and drinking restrictions  Follow instructions from your health care provider about eating and drinking, which may include:  A few days before the procedure - follow a low-fiber diet. Avoid nuts, seeds, dried fruit, raw fruits, and vegetables.  1-3 days before the procedure - follow a clear liquid diet. Drink only clear liquids, such as clear broth or bouillon, black coffee or tea, clear juice, clear soft drinks or sports drinks, gelatin dessert, and popsicles. Avoid any liquids that contain red or purple dye.  On the day of the procedure - do not eat or drink anything during the 2 hours before the procedure, or within the time period that your health care provider recommends. Bowel prep  If you were prescribed an oral bowel prep to clean out your colon:  Take it as told by your health care provider. Starting the day before your procedure, you will need  to drink a large amount of medicated liquid. The liquid will cause you to have multiple loose stools until your stool is almost clear or light green.  If your skin or anus gets irritated from diarrhea, you may use these to relieve the irritation:  Medicated wipes, such as adult wet wipes with aloe and vitamin E.  A skin soothing-product like petroleum jelly.  If you vomit while drinking the bowel prep, take a break for up to 60 minutes and then begin the bowel prep again. If vomiting continues and you cannot take the  bowel prep without vomiting, call your health care provider. General instructions   Ask your health care provider about changing or stopping your regular medicines. This is especially important if you are taking diabetes medicines or blood thinners.  Plan to have someone take you home from the hospital or clinic. What happens during the procedure?  An IV tube may be inserted into one of your veins.  You will be given medicine to help you relax (sedative).  To reduce your risk of infection:  Your health care team will wash or sanitize their hands.  Your anal area will be washed with soap.  You will be asked to lie on your side with your knees bent.  Your health care provider will lubricate a long, thin, flexible tube. The tube will have a camera and a light on the end.  The tube will be inserted into your anus.  The tube will be gently eased through your rectum and colon.  Air will be delivered into your colon to keep it open. You may feel some pressure or cramping.  The camera will be used to take images during the procedure.  A small tissue sample may be removed from your body to be examined under a microscope (biopsy). If any potential problems are found, the tissue will be sent to a lab for testing.  If small polyps are found, your health care provider may remove them and have them checked for cancer cells.  The tube that was inserted into your anus will be slowly removed. The procedure may vary among health care providers and hospitals. What happens after the procedure?  Your blood pressure, heart rate, breathing rate, and blood oxygen level will be monitored until the medicines you were given have worn off.  Do not drive for 24 hours after the exam.  You may have a small amount of blood in your stool.  You may pass gas and have mild abdominal cramping or bloating due to the air that was used to inflate your colon during the exam.  It is up to you to get the results  of your procedure. Ask your health care provider, or the department performing the procedure, when your results will be ready. This information is not intended to replace advice given to you by your health care provider. Make sure you discuss any questions you have with your health care provider. Document Released: 07/28/2000 Document Revised: 05/31/2016 Document Reviewed: 10/12/2015 Elsevier Interactive Patient Education  2017 [employer]  Colonoscopy, Adult, Care After This sheet gives you information about how to care for yourself after your procedure. Your health care provider may also give you more specific instructions. If you have problems or questions, contact your health care provider. What can I expect after the procedure? After the procedure, it is common to have:  A small amount of blood in your stool for 24 hours after the procedure.  Some gas.  Mild abdominal cramping or bloating. Follow these instructions at home: General instructions    For the first 24 hours after the procedure:  Do not drive or use machinery.  Do not sign important documents.  Do not drink alcohol.  Do your regular daily activities at a slower pace than normal.  Eat soft, easy-to-digest foods.  Rest often.  Take over-the-counter or prescription medicines only as told by your health care provider.  It is up to you to get the results of your procedure. Ask your health care provider, or the department performing the procedure, when your results will be ready. Relieving cramping and bloating   Try walking around when you have cramps or feel bloated.  Apply heat to your abdomen as told by your health care provider. Use a heat source that your health care provider recommends, such as a moist heat pack or a heating pad.  Place a towel between your skin and the heat source.  Leave the heat on for 20-30 minutes.  Remove the heat if your skin turns bright red. This is especially important if  you are unable to feel pain, heat, or cold. You may have a greater risk of getting burned. Eating and drinking   Drink enough fluid to keep your urine clear or pale yellow.  Resume your normal diet as instructed by your health care provider. Avoid heavy or fried foods that are hard to digest.  Avoid drinking alcohol for as long as instructed by your health care provider. Contact a health care provider if:  You have blood in your stool 2-3 days after the procedure. Get help right away if:  You have more than a small spotting of blood in your stool.  You pass large blood clots in your stool.  Your abdomen is swollen.  You have nausea or vomiting.  You have a fever.  You have increasing abdominal pain that is not relieved with medicine. This information is not intended to replace advice given to you by your health care provider. Make sure you discuss any questions you have with your health care provider. Document Released: 03/14/2004 Document Revised: 04/24/2016 Document Reviewed: 10/12/2015 Elsevier Interactive Patient Education  2017 [place] Anesthesia is a term that refers to techniques, procedures, and medicines that help a person stay safe and comfortable during a medical procedure. Monitored anesthesia care, or sedation, is one type of anesthesia. Your anesthesia specialist may recommend sedation if you will be having a procedure that does not require you to be unconscious, such as:  Cataract surgery.  A dental procedure.  A biopsy.  A colonoscopy. During the procedure, you may receive a medicine to help you relax (sedative). There are three levels of sedation:  Mild sedation. At this level, you may feel awake and relaxed. You will be able to follow directions.  Moderate sedation. At this level, you will be sleepy. You may not remember the procedure.  Deep sedation. At this level, you will be asleep. You will not remember the  procedure. The more medicine you are given, the deeper your level of sedation will be. Depending on how you respond to the procedure, the anesthesia specialist may change your level of sedation or the type of anesthesia to fit your needs. An anesthesia specialist will monitor you closely during the procedure. Let your health care provider know about:  Any allergies you have.  All medicines you are taking, including vitamins, herbs, eye drops, creams, and over-the-counter medicines.  Any use of steroids (by mouth or as a cream).  Any problems you or family members have had with sedatives and anesthetic medicines.  Any blood disorders you have.  Any surgeries you have had.  Any medical conditions you have, such as sleep apnea.  Whether you are pregnant or may be pregnant.  Any use of cigarettes, alcohol, or street drugs. What are the risks? Generally, this is a safe procedure. However, problems may occur, including:  Getting too much medicine (oversedation).  Nausea.  Allergic reaction to medicines.  Trouble breathing. If this happens, a breathing tube may be used to help with breathing. It will be removed when you are awake and breathing on your own.  Heart trouble.  Lung trouble. Before the procedure Staying hydrated  Follow instructions from your health care provider about hydration, which may include:  Up to 2 hours before the procedure - you may continue to drink clear liquids, such as water, clear fruit juice, black coffee, and plain tea. Eating and drinking restrictions  Follow instructions from your health care provider about eating and drinking, which may include:  8 hours before the procedure - stop eating heavy meals or foods such as meat, fried foods, or fatty foods.  6 hours before the procedure - stop eating light meals or foods, such as toast or cereal.  6 hours before the procedure - stop drinking milk or drinks that contain milk.  2 hours before the  procedure - stop drinking clear liquids. Medicines  Ask your health care provider about:  Changing or stopping your regular medicines. This is especially important if you are taking diabetes medicines or blood thinners.  Taking medicines such as aspirin and ibuprofen. These medicines can thin your blood. Do not take these medicines before your procedure if your health care provider instructs you not to. Tests and exams  You will have a physical exam.  You may have blood tests done to show:  How well your kidneys and liver are working.  How well your blood can clot.  General instructions  Plan to have someone take you home from the hospital or clinic.  If you will be going home right after the procedure, plan to have someone with you for 24 hours. What happens during the procedure?  Your blood pressure, heart rate, breathing, level of pain and overall condition will be monitored.  An IV tube will be inserted into one of your veins.  Your anesthesia specialist will give you medicines as needed to keep you comfortable during the procedure. This may mean changing the level of sedation.  The procedure will be performed. After the procedure  Your blood pressure, heart rate, breathing rate, and blood oxygen level will be monitored until the medicines you were given have worn off.  Do not drive for 24 hours if you received a sedative.  You may:  Feel sleepy, clumsy, or nauseous.  Feel forgetful about what happened after the procedure.  Have a sore throat if you had a breathing tube during the procedure.  Vomit. This information is not intended to replace advice given to you by your health care provider. Make sure you discuss any questions you have with your health care provider. Document Released: 04/26/2005 Document Revised: 01/07/2016 Document Reviewed: 11/21/2015 Elsevier Interactive Patient Education  2017 [place], Care After These  instructions provide you with information about caring for yourself after your procedure. Your health care provider may also give you more specific instructions.  Your treatment has been planned according to current medical practices, but problems sometimes occur. Call your health care provider if you have any problems or questions after your procedure. What can I expect after the procedure? After your procedure, it is common to:  Feel sleepy for several hours.  Feel clumsy and have poor balance for several hours.  Feel forgetful about what happened after the procedure.  Have poor judgment for several hours.  Feel nauseous or vomit.  Have a sore throat if you had a breathing tube during the procedure. Follow these instructions at home: For at least 24 hours after the procedure:    Do not:  Participate in activities in which you could fall or become injured.  Drive.  Use heavy machinery.  Drink alcohol.  Take sleeping pills or medicines that cause drowsiness.  Make important decisions or sign legal documents.  Take care of children on your own.  Rest. Eating and drinking   Follow the diet that is recommended by your health care provider.  If you vomit, drink water, juice, or soup when you can drink without vomiting.  Make sure you have little or no nausea before eating solid foods. General instructions   Have a responsible adult stay with you until you are awake and alert.  Take over-the-counter and prescription medicines only as told by your health care provider.  If you smoke, do not smoke without supervision.  Keep all follow-up visits as told by your health care provider. This is important. Contact a health care provider if:  You keep feeling nauseous or you keep vomiting.  You feel light-headed.  You develop a rash.  You have a fever. Get help right away if:  You have trouble breathing. This information is not intended to replace advice given to you  by your health care provider. Make sure you discuss any questions you have with your health care provider. Document Released: 11/21/2015 Document Revised: 03/22/2016 Document Reviewed: 11/21/2015 Elsevier Interactive Patient Education  2017 [employer].

## 2016-10-26 ENCOUNTER — Encounter (HOSPITAL_COMMUNITY): Payer: Self-pay

## 2016-10-26 ENCOUNTER — Encounter (HOSPITAL_COMMUNITY)
Admission: RE | Admit: 2016-10-26 | Discharge: 2016-10-26 | Disposition: A | Discharge status: Home / Self Care | Payer: Medicaid Other | Source: Ambulatory Visit | Attending: Gastroenterology | Admitting: Gastroenterology

## 2016-10-26 DIAGNOSIS — Z01818 Encounter for other preprocedural examination: Secondary | ICD-10-CM | POA: Diagnosis present

## 2016-10-26 DIAGNOSIS — Z01812 Encounter for preprocedural laboratory examination: Secondary | ICD-10-CM | POA: Insufficient documentation

## 2016-10-26 HISTORY — DX: Unspecified osteoarthritis, unspecified site: M19.90

## 2016-10-26 HISTORY — DX: Essential (primary) hypertension: I10

## 2016-10-26 LAB — BASIC METABOLIC PANEL
Anion gap: 9 (ref 5–15)
BUN: 9 mg/dL (ref 6–20)
CO2: 24 mmol/L (ref 22–32)
Calcium: 9.4 mg/dL (ref 8.9–10.3)
Chloride: 102 mmol/L (ref 101–111)
Creatinine, Ser: 0.88 mg/dL (ref 0.61–1.24)
GFR calc Af Amer: >60 mL/min (ref >60)
GFR calc non Af Amer: >60 mL/min (ref >60)
GLUCOSE: 87 mg/dL (ref 65–99)
POTASSIUM: 3.9 mmol/L (ref 3.5–5.1)
Sodium: 135 mmol/L (ref 135–145)

## 2016-10-26 LAB — CBC WITH DIFFERENTIAL/PLATELET
Basophils Absolute: 0.1 10*3/uL (ref 0.0–0.1)
Basophils Relative: 1 %
EOS PCT: 1 %
Eosinophils Absolute: 0.1 10*3/uL (ref 0.0–0.7)
HEMATOCRIT: 45.1 % (ref 39.0–52.0)
Hemoglobin: 15.5 g/dL (ref 13.0–17.0)
LYMPHS ABS: 2.6 10*3/uL (ref 0.7–4.0)
LYMPHS PCT: 35 %
MCH: 32.6 pg (ref 26.0–34.0)
MCHC: 34.4 g/dL (ref 30.0–36.0)
MCV: 94.7 fL (ref 78.0–100.0)
MONO ABS: 1.1 10*3/uL — AB (ref 0.1–1.0)
Monocytes Relative: 14 %
NEUTROS ABS: 3.6 10*3/uL (ref 1.7–7.7)
Neutrophils Relative %: 49 %
Platelets: 234 10*3/uL (ref 150–400)
RBC: 4.76 MIL/uL (ref 4.22–5.81)
RDW: 12.9 % (ref 11.5–15.5)
WBC: 7.4 10*3/uL (ref 4.0–10.5)

## 2016-10-27 ENCOUNTER — Other Ambulatory Visit (HOSPITAL_COMMUNITY): Payer: Medicaid Other

## 2016-10-31 ENCOUNTER — Encounter (HOSPITAL_COMMUNITY): Payer: Self-pay | Admitting: *Deleted

## 2016-10-31 ENCOUNTER — Ambulatory Visit (HOSPITAL_COMMUNITY): Payer: Medicaid Other | Admitting: Anesthesiology

## 2016-10-31 ENCOUNTER — Encounter (HOSPITAL_COMMUNITY)
Admission: RE | Disposition: A | Discharge status: Home / Self Care | Payer: Self-pay | Source: Ambulatory Visit | Attending: Gastroenterology

## 2016-10-31 ENCOUNTER — Ambulatory Visit (HOSPITAL_COMMUNITY)
Admission: RE | Admit: 2016-10-31 | Discharge: 2016-10-31 | Disposition: A | Discharge status: Home / Self Care | Payer: Medicaid Other | Source: Ambulatory Visit | Attending: Gastroenterology | Admitting: Gastroenterology

## 2016-10-31 DIAGNOSIS — K2980 Duodenitis without bleeding: Secondary | ICD-10-CM | POA: Diagnosis not present

## 2016-10-31 DIAGNOSIS — Q438 Other specified congenital malformations of intestine: Secondary | ICD-10-CM | POA: Insufficient documentation

## 2016-10-31 DIAGNOSIS — K7460 Unspecified cirrhosis of liver: Secondary | ICD-10-CM | POA: Diagnosis not present

## 2016-10-31 DIAGNOSIS — K2990 Gastroduodenitis, unspecified, without bleeding: Secondary | ICD-10-CM | POA: Diagnosis not present

## 2016-10-31 DIAGNOSIS — D123 Benign neoplasm of transverse colon: Secondary | ICD-10-CM | POA: Diagnosis not present

## 2016-10-31 DIAGNOSIS — Z1212 Encounter for screening for malignant neoplasm of rectum: Secondary | ICD-10-CM

## 2016-10-31 DIAGNOSIS — D124 Benign neoplasm of descending colon: Secondary | ICD-10-CM | POA: Insufficient documentation

## 2016-10-31 DIAGNOSIS — Z1211 Encounter for screening for malignant neoplasm of colon: Secondary | ICD-10-CM | POA: Insufficient documentation

## 2016-10-31 DIAGNOSIS — K648 Other hemorrhoids: Secondary | ICD-10-CM | POA: Insufficient documentation

## 2016-10-31 DIAGNOSIS — I10 Essential (primary) hypertension: Secondary | ICD-10-CM | POA: Insufficient documentation

## 2016-10-31 DIAGNOSIS — B9681 Helicobacter pylori [H. pylori] as the cause of diseases classified elsewhere: Secondary | ICD-10-CM | POA: Insufficient documentation

## 2016-10-31 DIAGNOSIS — K2970 Gastritis, unspecified, without bleeding: Secondary | ICD-10-CM | POA: Diagnosis not present

## 2016-10-31 DIAGNOSIS — Z96652 Presence of left artificial knee joint: Secondary | ICD-10-CM | POA: Diagnosis not present

## 2016-10-31 DIAGNOSIS — K644 Residual hemorrhoidal skin tags: Secondary | ICD-10-CM | POA: Insufficient documentation

## 2016-10-31 DIAGNOSIS — K2950 Unspecified chronic gastritis without bleeding: Secondary | ICD-10-CM | POA: Insufficient documentation

## 2016-10-31 HISTORY — PX: POLYPECTOMY: SHX5525

## 2016-10-31 HISTORY — PX: COLONOSCOPY WITH PROPOFOL: SHX5780

## 2016-10-31 HISTORY — PX: BIOPSY: SHX5522

## 2016-10-31 HISTORY — PX: ESOPHAGOGASTRODUODENOSCOPY (EGD) WITH PROPOFOL: SHX5813

## 2016-10-31 SURGERY — COLONOSCOPY WITH PROPOFOL
Anesthesia: Monitor Anesthesia Care

## 2016-10-31 MED ORDER — MIDAZOLAM HCL 5 MG/5ML IJ SOLN
INTRAMUSCULAR | Status: DC | PRN
  Administered 2016-10-31: 2 mg via INTRAVENOUS

## 2016-10-31 MED ORDER — PROPOFOL 10 MG/ML IV BOLUS
INTRAVENOUS | Status: AC
  Filled 2016-10-31: qty 20

## 2016-10-31 MED ORDER — FENTANYL CITRATE (PF) 100 MCG/2ML IJ SOLN
INTRAMUSCULAR | Status: AC
  Filled 2016-10-31: qty 2

## 2016-10-31 MED ORDER — CHLORHEXIDINE GLUCONATE CLOTH 2 % EX PADS: 6.0000 | MEDICATED_PAD | Freq: Once | CUTANEOUS | Status: DC

## 2016-10-31 MED ORDER — MIDAZOLAM HCL 2 MG/2ML IJ SOLN
INTRAMUSCULAR | Status: AC
  Filled 2016-10-31: qty 2

## 2016-10-31 MED ORDER — FENTANYL CITRATE (PF) 100 MCG/2ML IJ SOLN
25.0000 ug | INTRAMUSCULAR | Status: AC
Start: 2016-10-31 — End: 2016-10-31
  Administered 2016-10-31 (×2): 25 ug via INTRAVENOUS

## 2016-10-31 MED ORDER — LIDOCAINE VISCOUS 2 % MT SOLN
OROMUCOSAL | Status: AC
  Filled 2016-10-31: qty 15

## 2016-10-31 MED ORDER — LACTATED RINGERS IV SOLN
INTRAVENOUS | Status: DC
  Administered 2016-10-31: 13:00:00 via INTRAVENOUS

## 2016-10-31 MED ORDER — LABETALOL HCL 5 MG/ML IV SOLN
INTRAVENOUS | Status: AC
  Filled 2016-10-31: qty 4

## 2016-10-31 MED ORDER — PROPOFOL 10 MG/ML IV BOLUS
INTRAVENOUS | Status: AC
  Filled 2016-10-31: qty 40

## 2016-10-31 MED ORDER — LIDOCAINE VISCOUS 2 % MT SOLN
3.0000 mL | OROMUCOSAL | Status: AC | PRN
  Administered 2016-10-31 (×2): 3 mL via OROMUCOSAL

## 2016-10-31 MED ORDER — PROPOFOL 500 MG/50ML IV EMUL
INTRAVENOUS | Status: DC | PRN
  Administered 2016-10-31: 125 ug/kg/min via INTRAVENOUS

## 2016-10-31 MED ORDER — LABETALOL HCL 5 MG/ML IV SOLN
10.0000 mg | INTRAVENOUS | Status: AC | PRN
  Administered 2016-10-31 (×2): 10 mg via INTRAVENOUS

## 2016-10-31 MED ORDER — OMEPRAZOLE 20 MG PO CPDR: DELAYED_RELEASE_CAPSULE | ORAL | 11 refills | Status: DC

## 2016-10-31 MED ORDER — MIDAZOLAM HCL 2 MG/2ML IJ SOLN
1.0000 mg | INTRAMUSCULAR | Status: AC
  Administered 2016-10-31: 2 mg via INTRAVENOUS

## 2016-10-31 NOTE — Op Note (Signed)
[facility] Patient Name: Tyler Hart Surgeon Procedure Date: 10/31/2016 2:16 PM MRN: [id number] Date of Birth: [birth year] Attending MD: Barney Drain , MD CSN: [id number] Age: 56 Admit Type: Outpatient Procedure:                Upper GI endoscopy WITH COLD FORCEPS BIOPSY Indications:              Cirrhosis rule out esophageal varices-HEP C/DAILY                            ETOH. FIBROSURE-F2-F4 Providers:                Barney Drain, MD Referring MD:             Rosita Fire Medicines:                Propofol per Anesthesia Complications:            No immediate complications. Estimated Blood Loss:     Estimated blood loss was minimal. Procedure:                Pre-Anesthesia Assessment:                           - Prior to the procedure, a History and Physical                            was performed, and patient medications and                            allergies were reviewed. The patient's tolerance of                            previous anesthesia was also reviewed. The risks                            and benefits of the procedure and the sedation                            options and risks were discussed with the patient.                            All questions were answered, and informed consent                            was obtained. Prior Anticoagulants: The patient has                            taken no previous anticoagulant or antiplatelet                            agents. ASA Grade Assessment: II - A patient with                            mild systemic disease. After reviewing the risks  and benefits, the patient was deemed in                            satisfactory condition to undergo the procedure.                            After obtaining informed consent, the endoscope was                            passed under direct vision. Throughout the                            procedure, the patient's blood pressure, pulse, and                  oxygen saturations were monitored continuously. The                            [id number] ([id number]) scope was introduced through the                            mouth, and advanced to the second part of duodenum.                            The upper GI endoscopy was accomplished without                            difficulty. The patient tolerated the procedure                            well. Scope In: 2:17:47 PM Scope Out: 2:27:46 PM Total Procedure Duration: 0 hours 9 minutes 59 seconds  Findings:      The examined esophagus was normal.      Diffuse moderate inflammation characterized by congestion (edema),       erosions and erythema was found in the entire examined stomach. Biopsies       were taken with a cold forceps for Helicobacter pylori testing.      Patchy mild inflammation characterized by congestion (edema) and       erythema was found in the duodenal bulb.      -NO ESOPHAGEAL, GASTRIC, OR DUODENAL VARICES Impression:               - MODERATE Gastritis AND MILD Duodenitis. Moderate Sedation:      Per Anesthesia Care Recommendation:           - Await pathology results. COMPLETE LABS.                           - High fiber diet.                           - Continue present medications.                           - Use Prilosec (omeprazole) 20 mg PO BID.                           -  Patient has a contact number available for                            emergencies. The signs and symptoms of potential                            delayed complications were discussed with the                            patient. Return to normal activities tomorrow.                            Written discharge instructions were provided to the                            patient. Procedure Code(s):        --- Professional ---                           [id number], Esophagogastroduodenoscopy, flexible,                            transoral; with biopsy, single or multiple Diagnosis Code(s):         --- Professional ---                           K29.70, Gastritis, unspecified, without bleeding                           K29.80, Duodenitis without bleeding                           K74.60, Unspecified cirrhosis of liver CPT copyright 2016 American [employer] Association. All rights reserved. The codes documented in this report are preliminary and upon coder review may  be revised to meet current compliance requirements. Barney Drain, MD Barney Drain, MD 10/31/2016 2:47:44 PM This report has been signed electronically. Number of Addenda: 0

## 2016-10-31 NOTE — Discharge Instructions (Signed)
You had 2 polyps removed. YOUR PREP WAS NOT GOOD IN THE RIGHT COLON due to your eating solid food before your colonoscopy. I RINSED OUT A LOT OF THE REMAINING MATERIAL. You have internal AND EXTERNAL hemorrhoids. You have MODERATE gastritis, & MILD duodenitis.  I biopsied your stomach.    DRINK WATER TO KEEP YOUR URINE LIGHT YELLOW.  AVOID ITEMS THAT TRIGGER GASTRITIS. SEE INFO BELOW.  FOLLOW A HIGH FIBER/LOW FAT DIET. AVOID ITEMS THAT CAUSE BLOATING. SEE INFO BELOW.  YOUR BIOPSY RESULTS WILL BE AVAILABLE IN MY CHART AFTER MAR 26 AND MY OFFICE WILL CONTACT YOU IN 10-14 DAYS WITH YOUR RESULTS.   Next colonoscopy in 5-10 years.    ENDOSCOPY Care After Read the instructions outlined below and refer to this sheet in the next week. These discharge instructions provide you with general information on caring for yourself after you leave the hospital. While your treatment has been planned according to the most current medical practices available, unavoidable complications occasionally occur. If you have any problems or questions after discharge, call DR. Sarabi Sockwell, [phone].  ACTIVITY  You may resume your regular activity, but move at a slower pace for the next 24 hours.   Take frequent rest periods for the next 24 hours.   Walking will help get rid of the air and reduce the bloated feeling in your belly (abdomen).   No driving for 24 hours (because of the medicine (anesthesia) used during the test).   You may shower.   Do not sign any important legal documents or operate any machinery for 24 hours (because of the anesthesia used during the test).    NUTRITION  Drink plenty of fluids.   You may resume your normal diet as instructed by your doctor.   Begin with a light meal and progress to your normal diet. Heavy or fried foods are harder to digest and may make you feel sick to your stomach (nauseated).   Avoid alcoholic beverages for 24 hours or as instructed.     MEDICATIONS  You may resume your normal medications.   WHAT YOU CAN EXPECT TODAY  Some feelings of bloating in the abdomen.   Passage of more gas than usual.   Spotting of blood in your stool or on the toilet paper  .  IF YOU HAD POLYPS REMOVED DURING THE ENDOSCOPY:  Eat a soft diet IF YOU HAVE NAUSEA, BLOATING, ABDOMINAL PAIN, OR VOMITING.    FINDING OUT THE RESULTS OF YOUR TEST Not all test results are available during your visit. DR. Oneida Alar WILL CALL YOU WITHIN 14 DAYS OF YOUR PROCEDUE WITH YOUR RESULTS. Do not assume everything is normal if you have not heard from DR. Johnhenry Tippin, CALL HER OFFICE AT [phone].  SEEK IMMEDIATE MEDICAL ATTENTION AND CALL THE OFFICE: [phone] IF:  You have more than a spotting of blood in your stool.   Your belly is swollen (abdominal distention).   You are nauseated or vomiting.   You have a temperature over 101F.   You have abdominal pain or discomfort that is severe or gets worse throughout the day.   Gastritis/DUODENITIS  Gastritis/DUODENITIS is inflammation (the body's way of reacting to injury and/or infection) of the stomach/SMALLBOWEL. It is often caused by viral or bacterial (germ) infections. It can also be caused BY ASPIRIN, BC/GOODY POWDER'S, (IBUPROFEN) MOTRIN, OR ALEVE (NAPROXEN), chemicals (including alcohol), SPICY FOODS, and medications. This illness may be associated with generalized malaise (feeling tired, not well), UPPER ABDOMINAL STOMACH cramps, and fever.  One common bacterial cause of gastritis is an organism known as H. Pylori. This can be treated with antibiotics.    High-Fiber Diet A high-fiber diet changes your normal diet to include more whole grains, legumes, fruits, and vegetables. Changes in the diet involve replacing refined carbohydrates with unrefined foods. The calorie level of the diet is essentially unchanged. The Dietary Reference Intake (recommended amount) for adult males is 38 grams per day.  For adult females, it is 25 grams per day. Pregnant and lactating women should consume 28 grams of fiber per day.Fiber is the intact part of a plant that is not broken down during digestion. Functional fiber is fiber that has been isolated from the plant to provide a beneficial effect in the body.  PURPOSE  Increase stool bulk.   Ease and regulate bowel movements.   Lower cholesterol.   REDUCE RISK OF COLON CANCER  INDICATIONS THAT YOU NEED MORE FIBER  Constipation and hemorrhoids.   Uncomplicated diverticulosis (intestine condition) and irritable bowel syndrome.   Weight management.   As a protective measure against hardening of the arteries (atherosclerosis), diabetes, and cancer.     GUIDELINES FOR INCREASING FIBER IN THE DIET  Start adding fiber to the diet slowly. A gradual increase of about 5 more grams (2 slices of whole-wheat bread, 2 servings of most fruits or vegetables, or 1 bowl of high-fiber cereal) per day is best. Too rapid an increase in fiber may result in constipation, flatulence, and bloating.   Drink enough water and fluids to keep your urine clear or pale yellow. Water, juice, or caffeine-free drinks are recommended. Not drinking enough fluid may cause constipation.   Eat a variety of high-fiber foods rather than one type of fiber.   Try to increase your intake of fiber through using high-fiber foods rather than fiber pills or supplements that contain small amounts of fiber.   The goal is to change the types of food eaten. Do not supplement your present diet with high-fiber foods, but replace foods in your present diet.
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
## 2016-10-31 NOTE — Op Note (Signed)
[facility] Patient Name: Tyler Hart Procedure Date: 10/31/2016 1:15 PM MRN: [id number] Date of Birth: 1961/03/31 Attending MD: Barney Drain , MD CSN: [id number] Age: 56 Admit Type: Outpatient Procedure:                Colonoscopy WITH COLD FORCEPS/SNARE CAUTERY                            POLYPECTOMY Indications:              Screening for colorectal malignant neoplasm. ATE                            FULL MEAL FOR LUNCH. Providers:                Barney Drain, MD, Jeanann Lewandowsky. Sharon Seller, RN, Aram Candela Referring MD:             Rosita Fire Medicines:                Propofol per Anesthesia Complications:            No immediate complications. Estimated Blood Loss:     Estimated blood loss was minimal. Procedure:                Pre-Anesthesia Assessment:                           - Prior to the procedure, a History and Physical                            was performed, and patient medications and                            allergies were reviewed. The patient's tolerance of                            previous anesthesia was also reviewed. The risks                            and benefits of the procedure and the sedation                            options and risks were discussed with the patient.                            All questions were answered, and informed consent                            was obtained. Prior Anticoagulants: The patient has                            taken no previous anticoagulant or antiplatelet                            agents. ASA  Grade Assessment: II - A patient with                            mild systemic disease. After reviewing the risks                            and benefits, the patient was deemed in                            satisfactory condition to undergo the procedure.                            After obtaining informed consent, the colonoscope                            was passed under direct vision.  Throughout the                            procedure, the patient's blood pressure, pulse, and                            oxygen saturations were monitored continuously. The                            EC-[id number] ([id number]) scope was introduced through                            the anus and advanced to the the cecum, identified                            by appendiceal orifice and ileocecal valve. The                            colonoscopy was somewhat difficult due to                            inadequate bowel prep and a tortuous colon.                            Successful completion of the procedure was aided by                            straightening and shortening the scope to obtain                            bowel loop reduction, lavage and COLOWRAP. The                            patient tolerated the procedure well. The quality                            of the bowel preparation was adequate to identify  polyps AFTER LAVAGE. The ileocecal valve,                            appendiceal orifice, and rectum were photographed. Scope In: 1:46:16 PM Scope Out: 2:09:54 PM Scope Withdrawal Time: 0 hours 15 minutes 59 seconds  Total Procedure Duration: 0 hours 23 minutes 38 seconds  Findings:      The recto-sigmoid colon, sigmoid colon and descending colon were       significantly redundant.      A 8 mm polyp was found in the hepatic flexure. The polyp was sessile.       The polyp was removed with a hot snare. Resection and retrieval were       complete.      A 4 mm polyp was found in the descending colon. The polyp was sessile.       The polyp was removed with a cold biopsy forceps. Resection and       retrieval were complete.      Internal hemorrhoids were found during retroflexion. The hemorrhoids       were small.      External hemorrhoids were found during retroflexion. The hemorrhoids       were large. Impression:               - Redundant LEFT colon.                            - One 8 mm polyp at the hepatic flexure, removed                            with a hot snare. Resected and retrieved.                           - One 4 mm polyp in the descending colon, removed                            with a cold biopsy forceps. Resected and retrieved.                           - Internal hemorrhoids.                           - External hemorrhoids. Moderate Sedation:      Per Anesthesia Care Recommendation:           - Repeat colonoscopy in 5-10 years W/ MAC. PT                            SHOULD BE REMINDED NO SOLID FOOD DAY PRIOR TO TCS.                           - High fiber diet.                           - Continue present medications.                           - Await pathology results.                           -  Patient has a contact number available for                            emergencies. The signs and symptoms of potential                            delayed complications were discussed with the                            patient. Return to normal activities tomorrow.                            Written discharge instructions were provided to the                            patient. Procedure Code(s):        --- Professional ---                           [phone], Colonoscopy, flexible; with removal of                            tumor(s), polyp(s), or other lesion(s) by snare                            technique                           45380, 55, Colonoscopy, flexible; with biopsy,                            single or multiple Diagnosis Code(s):        --- Professional ---                           Z12.11, Encounter for screening for malignant                            neoplasm of colon                           D12.3, Benign neoplasm of transverse colon (hepatic                            flexure or splenic flexure)                           D12.4, Benign neoplasm of descending colon                           K64.4, Residual  hemorrhoidal skin tags                           K64.8, Other hemorrhoids                           Q43.8, Other specified congenital malformations of  intestine CPT copyright 2016 American Medical Association. All rights reserved. The codes documented in this report are preliminary and upon coder review may  be revised to meet current compliance requirements. Barney Drain, MD Barney Drain, MD 10/31/2016 2:32:54 PM This report has been signed electronically. Number of Addenda: 0

## 2016-10-31 NOTE — Anesthesia Postprocedure Evaluation (Signed)
Anesthesia Post Note  Patient: Tyler Hart  Procedure(s) Performed: Procedure(s) (LRB): COLONOSCOPY WITH PROPOFOL (N/A) ESOPHAGOGASTRODUODENOSCOPY (EGD) WITH PROPOFOL POLYPECTOMY BIOPSY  Patient location during evaluation: PACU Anesthesia Type: MAC Level of consciousness: awake and alert, oriented and patient cooperative Pain management: pain level controlled Vital Signs Assessment: post-procedure vital signs reviewed and stable Respiratory status: spontaneous breathing, respiratory function stable and patient connected to nasal cannula oxygen Cardiovascular status: stable Postop Assessment: no headache and no signs of nausea or vomiting Anesthetic complications: no     Last Vitals:  Vitals:   10/31/16 1245 10/31/16 1300  BP: (!) 130/92 138/87  Resp: 15 15  Temp:      Last Pain:  Vitals:   10/31/16 1109  TempSrc: Oral                 Elliott Lasecki

## 2016-10-31 NOTE — Transfer of Care (Signed)
Immediate Anesthesia Transfer of Care Note  Patient: Tyler Hart  Procedure(s) Performed: Procedure(s) with comments: COLONOSCOPY WITH PROPOFOL (N/A) - 1130  ESOPHAGOGASTRODUODENOSCOPY (EGD) WITH PROPOFOL POLYPECTOMY - colon BIOPSY - gastric   Patient Location: PACU  Anesthesia Type:MAC  Level of Consciousness: awake, alert  and oriented  Airway & Oxygen Therapy: Patient Spontanous Breathing and Patient connected to nasal cannula oxygen  Post-op Assessment: Report given to RN and Post -op Vital signs reviewed and stable  Post vital signs: Reviewed and stable  Last Vitals:  Vitals:   10/31/16 1245 10/31/16 1300  BP: (!) 130/92 138/87  Resp: 15 15  Temp:      Last Pain:  Vitals:   10/31/16 1109  TempSrc: Oral      Patients Stated Pain Goal: 7 (76/18/48 5927)  Complications: No apparent anesthesia complications

## 2016-10-31 NOTE — Anesthesia Postprocedure Evaluation (Signed)
Anesthesia Post Note  Patient: DENTON DERKS  Procedure(s) Performed: Procedure(s) (LRB): COLONOSCOPY WITH PROPOFOL (N/A) ESOPHAGOGASTRODUODENOSCOPY (EGD) WITH PROPOFOL POLYPECTOMY BIOPSY  Patient location during evaluation: Short Stay Anesthesia Type: MAC Level of consciousness: awake and alert Pain management: pain level controlled Vital Signs Assessment: post-procedure vital signs reviewed and stable Respiratory status: spontaneous breathing Cardiovascular status: stable Anesthetic complications: no     Last Vitals:  Vitals:   10/31/16 1530 10/31/16 1543  BP: (!) 155/89 (!) 150/88  Pulse: (!) 59 60  Resp: 16 18  Temp:  36.7 C    Last Pain:  Vitals:   10/31/16 1543  TempSrc: Oral                 Rito Lecomte

## 2016-10-31 NOTE — Interval H&P Note (Signed)
History and Physical Interval Note:  10/31/2016 1:01 PM  Tyler Hart  has presented today for surgery, with the diagnosis of SCREENING  The various methods of treatment have been discussed with the patient and family. After consideration of risks, benefits and other options for treatment, the patient has consented to  Procedure(s) with comments: COLONOSCOPY WITH PROPOFOL (N/A) - 1130  as a surgical intervention .  The patient's history has been reviewed, patient examined, no change in status, stable for surgery.  I have reviewed the patient's chart and labs.  Questions were answered to the patient's satisfaction.     [employer]

## 2016-10-31 NOTE — Anesthesia Preprocedure Evaluation (Signed)
Anesthesia Evaluation  Patient identified by MRN, date of birth, ID band Patient awake    Reviewed: Allergy & Precautions, NPO status , Patient's Chart, lab work & pertinent test results  Airway Mallampati: II  TM Distance: >3 FB     Dental  (+) Teeth Intact   Pulmonary neg pulmonary ROS,    breath sounds clear to auscultation       Cardiovascular hypertension, negative cardio ROS   Rhythm:Regular Rate:Normal     Neuro/Psych    GI/Hepatic negative GI ROS, (+) Hepatitis -, C  Endo/Other    Renal/GU      Musculoskeletal   Abdominal   Peds  Hematology   Anesthesia Other Findings   Reproductive/Obstetrics                             Anesthesia Physical Anesthesia Plan  ASA: II  Anesthesia Plan: MAC   Post-op Pain Management:    Induction: Intravenous  Airway Management Planned: Simple Face Mask  Additional Equipment:   Intra-op Plan:   Post-operative Plan:   Informed Consent: I have reviewed the patients History and Physical, chart, labs and discussed the procedure including the risks, benefits and alternatives for the proposed anesthesia with the patient or authorized representative who has indicated his/her understanding and acceptance.     Plan Discussed with:   Anesthesia Plan Comments:         Anesthesia Quick Evaluation

## 2016-11-01 ENCOUNTER — Inpatient Hospital Stay: Admit: 2016-11-01 | Payer: Medicaid Other | Admitting: Orthopedic Surgery

## 2016-11-01 SURGERY — ARTHROPLASTY, KNEE, TOTAL
Anesthesia: Spinal | Laterality: Right

## 2016-11-03 ENCOUNTER — Encounter (HOSPITAL_COMMUNITY): Payer: Self-pay | Admitting: Gastroenterology

## 2016-11-13 ENCOUNTER — Ambulatory Visit: Payer: Medicaid Other | Admitting: Orthopedic Surgery

## 2016-11-14 ENCOUNTER — Ambulatory Visit: Payer: Medicaid Other | Admitting: Orthopedic Surgery

## 2016-11-20 ENCOUNTER — Telehealth: Payer: Self-pay | Admitting: Gastroenterology

## 2016-11-20 MED ORDER — AMOXICILLIN 500 MG PO TABS: ORAL_TABLET | ORAL | 0 refills | Status: DC

## 2016-11-20 MED ORDER — CLARITHROMYCIN 500 MG PO TABS: ORAL_TABLET | ORAL | 0 refills | Status: DC

## 2016-11-20 NOTE — Telephone Encounter (Signed)
PT is aware of results and plan.  

## 2016-11-20 NOTE — Telephone Encounter (Signed)
Reminder in epic °

## 2016-11-20 NOTE — Telephone Encounter (Signed)
PLEASE CALL PT. HIS stomach Bx showed H. Pylori infection. He needs AMOXICILLIN 500 mg 2 po BID for 10 days and Biaxin 500 mg po bid for 10 days. CONTINUE OMEPRAZOLE BID FOR 3 MOS THEN ONCE DAILY.Med side effects include NVD, abd pain, and metallic taste.  HE had TWO simple adenomas removed from HIS colon. FOLLOW A HIGH FIBER DIET. TCS IN 5 YEARS.  NEXT OPV APR 26.

## 2016-12-04 ENCOUNTER — Ambulatory Visit: Payer: Medicaid Other | Admitting: Orthopedic Surgery

## 2016-12-04 ENCOUNTER — Telehealth: Payer: Self-pay | Admitting: Orthopedic Surgery

## 2016-12-04 NOTE — Telephone Encounter (Signed)
Patient's recent appointments had been cancelled due to patient not having the total knee surgery (right knee).  His appointment for today, 12/04/16, appears to have been for left knee.  Patient states he does need to come in, to re-discuss scheduling surgery for right knee, which he may be able to do in a month or so.  Appointment needed for this?  Please advise. [phone] (Mobile)

## 2016-12-05 NOTE — Telephone Encounter (Signed)
Okay to give appointment

## 2016-12-06 NOTE — Telephone Encounter (Signed)
Called patient appointment scheduled .

## 2016-12-07 ENCOUNTER — Ambulatory Visit: Payer: Medicaid Other | Admitting: Gastroenterology

## 2016-12-07 NOTE — Progress Notes (Deleted)
   Subjective:    Patient ID: Tyler Hart, male    DOB: 1960-08-18, 56 y.o.   MRN: [id number]  HPI    Review of Systems     Objective:   Physical Exam        Assessment & Plan:

## 2016-12-18 ENCOUNTER — Encounter: Payer: Self-pay | Admitting: Orthopedic Surgery

## 2016-12-18 ENCOUNTER — Ambulatory Visit (INDEPENDENT_AMBULATORY_CARE_PROVIDER_SITE_OTHER): Payer: Medicaid Other | Admitting: Orthopedic Surgery

## 2016-12-18 DIAGNOSIS — Z96652 Presence of left artificial knee joint: Secondary | ICD-10-CM

## 2016-12-18 DIAGNOSIS — M1711 Unilateral primary osteoarthritis, right knee: Secondary | ICD-10-CM | POA: Diagnosis not present

## 2016-12-18 DIAGNOSIS — M7632 Iliotibial band syndrome, left leg: Secondary | ICD-10-CM | POA: Diagnosis not present

## 2016-12-18 DIAGNOSIS — G8929 Other chronic pain: Secondary | ICD-10-CM

## 2016-12-18 DIAGNOSIS — M25561 Pain in right knee: Secondary | ICD-10-CM | POA: Diagnosis not present

## 2016-12-18 MED ORDER — HYDROCODONE-ACETAMINOPHEN 5-325 MG PO TABS: 1.0000 | ORAL_TABLET | Freq: Three times a day (TID) | ORAL | 0 refills | Status: DC | PRN
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
## 2016-12-18 NOTE — Patient Instructions (Signed)
You have received an injection of steroids into the joint. 15% of patients will have increased pain within the 24 hours postinjection.   This is transient and will go away.   We recommend that you use ice packs on the injection site for 20 minutes every 2 hours and extra strength Tylenol 2 tablets every 8 as needed until the pain resolves.  If you continue to have pain after taking the Tylenol and using the ice please call the office for further instructions.  You have decided to proceed with knee replacement surgery. You have decided not to continue with nonoperative measures such as but not limited to oral medication, weight loss, activity modification, physical therapy, bracing, or injection.  We will perform the procedure commonly known as total knee replacement. Some of the risks associated with knee replacement surgery include but are not limited to Bleeding Infection Swelling Stiffness Blood clot Pain that persists even after surgery  Infection is especially devastating complication of knee surgery although rare. If infection does occur your implant will usually have to be removed and several surgeries and antibiotics will be needed to eradicate the infection prior to performing a repeat replacement.   In some cases amputation is required to eradicate the infection. In other rare cases a knee fusion is needed    If you're not comfortable with these risks and would like to continue with nonoperative treatment please let Dr. Aline Brochure know prior to your surgery.

## 2016-12-18 NOTE — Addendum Note (Signed)
Addended by: Baldomero Lamy B on: 12/18/2016 03:30 PM   Modules accepted: Orders

## 2016-12-21 ENCOUNTER — Other Ambulatory Visit: Payer: Self-pay | Admitting: *Deleted

## 2017-01-09 ENCOUNTER — Other Ambulatory Visit: Payer: Self-pay | Admitting: *Deleted

## 2017-01-09 DIAGNOSIS — Z96651 Presence of right artificial knee joint: Secondary | ICD-10-CM

## 2017-01-11 ENCOUNTER — Telehealth: Payer: Self-pay | Admitting: Orthopedic Surgery

## 2017-01-11 NOTE — Telephone Encounter (Signed)
Patient wants to cancel (L) TKA on 01-18-17.  He wants to reschedule this either July or August.  He asked that you call him when you have time.  Thanks

## 2017-01-11 NOTE — Patient Instructions (Signed)
Tyler Hart  01/11/2017     @PREFPERIOPPHARMACY @   Your procedure is scheduled on  01/18/2017  Report to [facility] at  39 A.M.  Call this number if you have problems the morning of surgery:  [phone]   Remember:  Do not eat food or drink liquids after midnight.  Take these medicines the morning of surgery with A SIP OF WATER  Hydrocodone.   Do not wear jewelry, make-up or nail polish.  Do not wear lotions, powders, or perfumes, or deoderant.  Do not shave 48 hours prior to surgery.  Men may shave face and neck.  Do not bring valuables to the hospital.  [facility] is not responsible for any belongings or valuables.  Contacts, dentures or bridgework may not be worn into surgery.  Leave your suitcase in the car.  After surgery it may be brought to your room.  For patients admitted to the hospital, discharge time will be determined by your treatment team.  Patients discharged the day of surgery will not be allowed to drive home.   Name and phone number of your driver:   family Special instructions:  None  Please read over the following fact sheets that you were given. Pain Booklet, Coughing and Deep Breathing, Blood Transfusion Information, Total Joint Packet, MRSA Information, Surgical Site Infection Prevention, Anesthesia Post-op Instructions and Care and Recovery After Surgery          Total Knee Replacement Total knee replacement is a procedure to replace the knee joint with an artificial (prosthetic) knee joint. The purpose of this surgery is to reduce knee pain and improve knee function. The prosthetic knee joint (prosthesis) is usually made of metal and plastic. It replaces parts of the thigh bone (femur), lower leg bone (tibia), and kneecap (patella) that are removed during the procedure. Tell a health care provider about:  Any allergies you have.  All medicines you are taking, including vitamins, herbs, eye drops, creams, and  over-the-counter medicines.  Any problems you or family members have had with anesthetic medicines.  Any blood disorders you have.  Any surgeries you have had.  Any medical conditions you have.  Whether you are pregnant or may be pregnant. What are the risks? Generally, this is a safe procedure. However, problems may occur, including:  Infection.  Bleeding.  Allergic reactions to medicines.  Damage to other structures or organs.  Decreased range of motion of the knee.  Instability of the knee.  Loosening of the prosthetic joint.  Knee pain that does not go away (chronic pain).  What happens before the procedure?  Ask your health care provider about: ? Changing or stopping your regular medicines. This is especially important if you are taking diabetes medicines or blood thinners. ? Taking medicines such as aspirin and ibuprofen. These medicines can thin your blood. Do not take these medicines before your procedure if your health care provider instructs you not to.  Have dental care and routine cleanings completed before your procedure. Plan to not have dental work done for 3 months after your procedure. Germs from anywhere in your body, including your mouth, can travel to your new joint and infect it.  Follow instructions from your health care provider about eating or drinking restrictions.  Ask your health care provider how your surgical site will be marked or identified.  You may be given antibiotic medicine to help prevent infection.  If your health  care provider prescribes physical therapy, do exercises as instructed.  Do not use any tobacco products, such as cigarettes, chewing tobacco, or e-cigarettes. If you need help quitting, ask your health care provider.  You may have a physical exam.  You may have tests, such as: ? X-rays. ? MRI. ? CT scan. ? Bone scans.  You may have a blood or urine sample taken.  Plan to have someone take you home after the  procedure.  If you will be going home right after the procedure, plan to have someone with you for at least 24 hours. It is recommended that you have someone to help care for you for at least 4-6 weeks after your procedure. What happens during the procedure?  To reduce your risk of infection: ? Your health care team will wash or sanitize their hands. ? Your skin will be washed with soap.  An IV tube will be inserted into one of your veins.  You will be given one or more of the following: ? A medicine to help you relax (sedative). ? A medicine to numb the area (local anesthetic). ? A medicine to make you fall asleep (general anesthetic). ? A medicine that is injected into your spine to numb the area below and slightly above the injection site (spinal anesthetic). ? A medicine that is injected into an area of your body to numb everything below the injection site (regional anesthetic).  An incision will be made in your knee.  Damaged cartilage and bone will be removed from your femur, tibia, and patella.  Parts of the prosthesis (liners)will be placed over the areas of bone and cartilage that were removed. A metal liner will be placed over your femur, and plastic liners will be placed over your tibia and the underside of your patella.  One or more small tubes (drains) may be placed near your incision to help drain extra fluid from your surgical site.  Your incision will be closed with stitches (sutures), skin glue, or adhesive strips. Medicine may be applied to your incision.  A bandage (dressing) will be placed over your incision. The procedure may vary among health care providers and hospitals. What happens after the procedure?  Your blood pressure, heart rate, breathing rate, and blood oxygen level will be monitored often until the medicines you were given have worn off.  You may continue to receive fluids and medicines through an IV tube.  You will have some pain. Pain medicines  will be available to help you.  You may have fluid coming from one or more drains in your incision.  You may have to wear compression stockings. These stockings help to prevent blood clots and reduce swelling in your legs.  You will be encouraged to move around as much as possible.  You may be given a continuous passive motion machine to use at home. You will be shown how to use this machine.  Do not drive for 24 hours if you received a sedative. This information is not intended to replace advice given to you by your health care provider. Make sure you discuss any questions you have with your health care provider. Document Released: 11/06/2000 Document Revised: 04/03/2016 Document Reviewed: 07/07/2015 Elsevier Interactive Patient Education  2017 [place].  Total Knee Replacement, Care After These instructions give you information about caring for yourself after your procedure. Your doctor may also give you more specific instructions. Call your doctor if you have any problems or questions after your procedure. Follow  these instructions at home: Medicines  Take over-the-counter and prescription medicines only as told by your doctor.  If you were prescribed an antibiotic medicine, take it as told by your doctor. Do not stop taking the antibiotic even if you start to feel better.  If you were prescribed a blood thinner (anticoagulant), take it as told by your doctor. If you have a splint or brace:  Wear the splint or brace as told by your doctor. Remove it only as told by your doctor.  Loosen the splint or brace if your toes tingle, get numb, or turn cold and blue.  Do not let your splint or brace get wet if it is not waterproof.  Keep the splint or brace clean. Bathing   Do not take baths, swim, or use a hot tub until your doctor says it is okay. Ask your doctor if you can take showers. You may only be allowed to take sponge baths for bathing.  If you have a splint or brace  that is not waterproof, cover it with a watertight covering when you take a bath or a shower.  Keep your bandage (dressing) dry until your doctor says it can be taken off. Incision care and drain care  Check your cut from surgery (incision) and your drain every day for signs of infection. Check for: ? More redness, swelling, or pain. ? More fluid or blood. ? Warmth. ? Pus or a bad smell.  Follow instructions from your doctor about how to take care of your cut from surgery. Make sure you: ? Wash your hands with soap and water before you change your bandage. If you cannot use soap and water, use hand sanitizer. ? Change your bandage as told by your doctor. ? Leave stitches (sutures), skin glue, or skin tape (adhesive) strips in place. They may need to stay in place for 2 weeks or longer. If tape strips get loose and curl up, you may trim the loose edges. Do not remove tape strips completely unless your doctor says it is okay.  If you have a drain, follow instructions from your doctor about caring for it. Do not remove the drain tube or any bandages unless your doctor says it is okay. Managing pain, stiffness, and swelling   If directed, put ice on your knee. ? Put ice in a plastic bag. ? Place a towel between your skin and the bag. ? Leave the ice on for 20 minutes, 2-3 times per day.  If directed, apply heat to the affected area as often as told by your doctor. Use the heat source that your doctor recommends, such as a moist heat pack or a heating pad. ? Place a towel between your skin and the heat source. ? Leave the heat on for 20-30 minutes. ? Remove the heat if your skin turns bright red. This is especially important if you are unable to feel pain, heat, or cold. You may have a greater risk of getting burned.  Move your toes often to avoid stiffness and to lessen swelling.  Raise (elevate) your knee above the level of your heart while you are sitting or lying down.  Wear elastic  knee support for as long as told by your doctor. Driving   Do not drive until your doctor says it is okay. Ask your doctor when it is safe to drive if you have a splint or brace on your knee.  Do not drive or use heavy machinery while taking prescription pain  medicine.  Do not drive for 24 hours if you received a sedative. Activity  Do not lift anything that is heavier than 10 lb (4.5 kg) until your doctor says it is okay.  Do not play contact sports until your doctor says it is okay.  Avoid high-impact activities, including running, jumping rope, and jumping jacks.  Avoid sitting for a long time without moving. Get up and move around at least every few hours.  If physical therapy was prescribed, do exercises as told by your doctor.  Return to your normal activities as told by your doctor. Ask your doctor what activities are safe for you. Safety  Do not use your leg to support your body weight until your doctor says that you can. Use crutches or a walker as told by your doctor. General instructions   Do not have any dental work done for at least 3 months after your surgery. When you do have dental work done, tell your dentist about your joint replacement.  Do not use any tobacco products, such as cigarettes, chewing tobacco, or e-cigarettes. If you need help quitting, ask your doctor.  Wear special socks (compression stockings) as told by your doctor.  If you have been sent home with a knee joint motion machine (continuous passive motion machine), use it as told by your doctor.  Drink enough fluid to keep your pee (urine) clear or pale yellow.  If you have been told to lose weight, follow instructions from your doctor about how to do this safely.  Keep all follow-up visits as told by your doctor. This is important. Contact a doctor if:  You have more redness, swelling, or pain around your cut from surgery or your drain.  You have more fluid or blood coming from your cut  from surgery or your drain.  Your cut from surgery or your drain area feels warm to the touch.  You have pus or a bad smell coming from your cut from surgery or your drain.  You have a fever.  Your cut breaks open after your doctor removes your stitches, skin glue, or skin tape strips.  Your new joint feels loose.  You have knee pain that does not go away. Get help right away if:  You have a rash.  You have pain in your calf or thigh.  You have swelling in your calf or thigh.  You have shortness of breath.  You have trouble breathing.  You have chest pain.  Your ability to move your knee is getting worse. This information is not intended to replace advice given to you by your health care provider. Make sure you discuss any questions you have with your health care provider. Document Released: 10/23/2011 Document Revised: 04/03/2016 Document Reviewed: 07/07/2015 Elsevier Interactive Patient Education  2017 [place].  Spinal Anesthesia and Epidural Anesthesia Spinal anesthesia and epidural anesthesia are methods of numbing the body. They are done by injecting numbing medicine (anesthetic) into the back, near the spinal cord. Spinal anesthesia is usually done to numb an area at and below the place where the injection is made. It is often used during surgeries of the pelvis, hips, legs, and lower abdomen. It begins to work almost immediately. Epidural anesthesia may be done to numb an area above or below the area where the injection is made. It is often used during childbirth and after major abdominal or chest surgery. It begins to work after 10-20 minutes. Tell a health care provider about:  Any allergies  you have.  All medicines you are taking, including vitamins, herbs, eye drops, creams, and over-the-counter medicines.  Any problems you or family members have had with anesthetic medicines.  Any blood disorders you have.  Any surgeries you have had.  Any medical  conditions you have.  Whether you are pregnant or may be pregnant.  Any recent alcohol, tobacco, or drug use. What are the risks? Generally, this is a safe procedure. However, problems may occur, including:  Headache.  A drop in blood pressure. In some cases, this can lead to a heart attack or a stroke.  Nerve damage.  Infection.  Allergic reaction to medicines.  Seizures.  Spinal fluid leak.  Bleeding around the injection site.  Inability to breathe. If this happens, a tube may be put into your windpipe (trachea) and a machine may be used to help you breathe until the anesthetic wears off.  Long-lasting numbness, pain, or loss of function of body parts.  Nausea and vomiting.  Dizziness and fainting.  Shivering.  Itching.  What happens before the procedure? Staying hydrated Follow instructions from your health care provider about hydration, which may include:  Up to 2 hours before the procedure - you may continue to drink clear liquids, such as water, clear fruit juice, black coffee, and plain tea.  Eating and drinking restrictions Follow instructions from your health care provider about eating and drinking, which may include:  8 hours before the procedure - stop eating heavy meals or foods such as meat, fried foods, or fatty foods.  6 hours before the procedure - stop eating light meals or foods, such as toast or cereal.  6 hours before the procedure - stop drinking milk or drinks that contain milk.  2 hours before the procedure - stop drinking clear liquids.  Medicine Ask your health care provider about:  Changing or stopping your regular medicines. This is especially important if you are taking diabetes medicines or blood thinners.  Changing or stopping your dietary supplements.  Taking medicines such as aspirin and ibuprofen. These medicines can thin your blood. Do not take these medicines before your procedure if your health care provider instructs you  not to.  General instructions   Do not use any tobacco products, such as cigarettes, chewing tobacco, and electronic cigarettes, for as long as possible before your procedure. If you need help quitting, ask your health care provider.  Ask your health care provider if you will have to stay overnight at the hospital or clinic.  If you will not have to stay overnight: ? Plan to have someone take you home from the hospital or clinic. ? Plan to have someone with you for 24 hours. What happens during the procedure?  A health care provider will put patches on your chest, a cuff around your arm, or a sensor device on your finger. These will be attached to monitors that allow your health care provider to watch your blood pressure, pulse, and oxygen levels to make sure that the anesthetic does not cause any problems.  An IV tube may be inserted into one of your veins. The tube will be used to give you fluids and medicines throughout the procedure as needed.  You may be given a medicine to help you relax (sedative).  You will be asked to sit up or to lie on your side with your knees and your chin bent toward your chest. These positions open up the space between the bones in your back, making it  easier to inject the medicine.  The area of your back where the medicine will be injected will be cleaned.  A medicine called a local anesthetic may be injected to numb the area where the spinal or epidural anesthetic will be injected.  A needle will be inserted between the bones of your back. While this is being done: ? Continue to breathe normally. ? Stay as still and quiet as you can. ? If you feel a tingling shock or pain going down your leg, tell your health care provider but try not to move.  The spinal or epidural anesthetic will be injected.  If you receive an epidural anesthetic and need more than one dose, a tiny, flexible tube (catheter) will be placed where the anesthetic was injected.  Additional doses will be given through the catheter. If you need pain medicine after the procedure, the catheter will be kept in place.  If an epidural catheter has not been placed in your injection site or left there, a small bandage (dressing) will be placed over the injection site. The procedure may vary among health care providers and hospitals. What happens after the procedure?  You will need to stay in bed until your health care provider says it safe for you to walk.  Your blood pressure, heart rate, breathing rate, and blood oxygen level will be monitored until the medicines you were given have worn off.  If you have a catheter, it will be removed when it is no longer needed.  Do not drive for 24 hours if you received a sedative.  It is common to have nausea and itching. There are medicines that can help with these side effects. It is also common to have: ? Sleepiness. ? Vomiting. ? Numbness or tingling in your legs. ? Trouble urinating. This information is not intended to replace advice given to you by your health care provider. Make sure you discuss any questions you have with your health care provider. Document Released: 10/21/2003 Document Revised: 01/11/2016 Document Reviewed: 11/22/2015 Elsevier Interactive Patient Education  2018 [employer].  Spinal Anesthesia and Epidural Anesthesia, Care After These instructions give you information about caring for yourself after your procedure. Your doctor may also give you more specific instructions. Call your doctor if you have any problems or questions after your procedure. Follow these instructions at home: For at least 24 hours after the procedure:   Do not: ? Do activities where you could fall or get hurt (injured). ? Drive. ? Use heavy machinery. ? Drink alcohol. ? Take sleeping pills or medicines that make you sleepy (drowsy). ? Make important decisions. ? Sign legal documents. ? Take care of children on your  own.  Rest. Eating and drinking  If you throw up (vomit), drink water, juice, or soup when you can drink without throwing up.  Make sure you do not feel like throwing up (are not nauseous) before you eat.  Follow the diet that is recommended by your doctor. General instructions  Have a responsible adult stay with you until you are awake and alert.  Take over-the-counter and prescription medicines only as told by your doctor.  If you smoke, do not smoke unless an adult is watching.  Keep all follow-up visits as told by your doctor. This is important. Contact a doctor if:  It has been more than one day since your procedure and you feel like throwing up.  It has been more than one day since your procedure and you throw up.  You have a rash. Get help right away if:  You have a fever.  You have a headache that lasts a long time.  You have a very bad headache.  Your vision is blurry.  You see two of a single object (double vision).  You are dizzy or light-headed.  You faint.  Your arms or legs tingle, feel weak, or get numb.  You have trouble breathing.  You cannot pee (urinate). This information is not intended to replace advice given to you by your health care provider. Make sure you discuss any questions you have with your health care provider. Document Released: 11/22/2015 Document Revised: 03/23/2016 Document Reviewed: 11/22/2015 Elsevier Interactive Patient Education  2018 [place] Anesthesia, Adult General anesthesia is the use of medicines to make a person "go to sleep" (be unconscious) for a medical procedure. General anesthesia is often recommended when a procedure:  Is long.  Requires you to be still or in an unusual position.  Is major and can cause you to lose blood.  Is impossible to do without general anesthesia.  The medicines used for general anesthesia are called general anesthetics. In addition to making you sleep, the  medicines:  Prevent pain.  Control your blood pressure.  Relax your muscles.  Tell a health care provider about:  Any allergies you have.  All medicines you are taking, including vitamins, herbs, eye drops, creams, and over-the-counter medicines.  Any problems you or family members have had with anesthetic medicines.  Types of anesthetics you have had in the past.  Any bleeding disorders you have.  Any surgeries you have had.  Any medical conditions you have.  Any history of heart or lung conditions, such as heart failure, sleep apnea, or chronic obstructive pulmonary disease (COPD).  Whether you are pregnant or may be pregnant.  Whether you use tobacco, alcohol, marijuana, or street drugs.  Any history of Armed forces logistics/support/administrative officer.  Any history of depression or anxiety. What are the risks? Generally, this is a safe procedure. However, problems may occur, including:  Allergic reaction to anesthetics.  Lung and heart problems.  Inhaling food or liquids from your stomach into your lungs (aspiration).  Injury to nerves.  Waking up during your procedure and being unable to move (rare).  Extreme agitation or a state of mental confusion (delirium) when you wake up from the anesthetic.  Air in the bloodstream, which can lead to stroke.  These problems are more likely to develop if you are having a major surgery or if you have an advanced medical condition. You can prevent some of these complications by answering all of your health care provider's questions thoroughly and by following all pre-procedure instructions. General anesthesia can cause side effects, including:  Nausea or vomiting  A sore throat from the breathing tube.  Feeling cold or shivery.  Feeling tired, washed out, or achy.  Sleepiness or drowsiness.  Confusion or agitation.  What happens before the procedure? Staying hydrated Follow instructions from your health care provider about hydration, which  may include:  Up to 2 hours before the procedure - you may continue to drink clear liquids, such as water, clear fruit juice, black coffee, and plain tea.  Eating and drinking restrictions Follow instructions from your health care provider about eating and drinking, which may include:  8 hours before the procedure - stop eating heavy meals or foods such as meat, fried foods, or fatty foods.  6 hours before the procedure - stop eating light  meals or foods, such as toast or cereal.  6 hours before the procedure - stop drinking milk or drinks that contain milk.  2 hours before the procedure - stop drinking clear liquids.  Medicines  Ask your health care provider about: ? Changing or stopping your regular medicines. This is especially important if you are taking diabetes medicines or blood thinners. ? Taking medicines such as aspirin and ibuprofen. These medicines can thin your blood. Do not take these medicines before your procedure if your health care provider instructs you not to. ? Taking new dietary supplements or medicines. Do not take these during the week before your procedure unless your health care provider approves them.  If you are told to take a medicine or to continue taking a medicine on the day of the procedure, take the medicine with sips of water. General instructions   Ask if you will be going home the same day, the following day, or after a longer hospital stay. ? Plan to have someone take you home. ? Plan to have someone stay with you for the first 24 hours after you leave the hospital or clinic.  For 3-6 weeks before the procedure, try not to use any tobacco products, such as cigarettes, chewing tobacco, and e-cigarettes.  You may brush your teeth on the morning of the procedure, but make sure to spit out the toothpaste. What happens during the procedure?  You will be given anesthetics through a mask and through an IV tube in one of your veins.  You may receive  medicine to help you relax (sedative).  As soon as you are asleep, a breathing tube may be used to help you breathe.  An anesthesia specialist will stay with you throughout the procedure. He or she will help keep you comfortable and safe by continuing to give you medicines and adjusting the amount of medicine that you get. He or she will also watch your blood pressure, pulse, and oxygen levels to make sure that the anesthetics do not cause any problems.  If a breathing tube was used to help you breathe, it will be removed before you wake up. The procedure may vary among health care providers and hospitals. What happens after the procedure?  You will wake up, often slowly, after the procedure is complete, usually in a recovery area.  Your blood pressure, heart rate, breathing rate, and blood oxygen level will be monitored until the medicines you were given have worn off.  You may be given medicine to help you calm down if you feel anxious or agitated.  If you will be going home the same day, your health care provider may check to make sure you can stand, drink, and urinate.  Your health care providers will treat your pain and side effects before you go home.  Do not drive for 24 hours if you received a sedative.  You may: ? Feel nauseous and vomit. ? Have a sore throat. ? Have mental slowness. ? Feel cold or shivery. ? Feel sleepy. ? Feel tired. ? Feel sore or achy, even in parts of your body where you did not have surgery. This information is not intended to replace advice given to you by your health care provider. Make sure you discuss any questions you have with your health care provider. Document Released: 11/07/2007 Document Revised: 01/11/2016 Document Reviewed: 07/15/2015 Elsevier Interactive Patient Education  2018 [facility], Adult, Care After These instructions provide you with information about caring for  yourself after your procedure. Your health  care provider may also give you more specific instructions. Your treatment has been planned according to current medical practices, but problems sometimes occur. Call your health care provider if you have any problems or questions after your procedure. What can I expect after the procedure? After the procedure, it is common to have:  Vomiting.  A sore throat.  Mental slowness.  It is common to feel:  Nauseous.  Cold or shivery.  Sleepy.  Tired.  Sore or achy, even in parts of your body where you did not have surgery.  Follow these instructions at home: For at least 24 hours after the procedure:  Do not: ? Participate in activities where you could fall or become injured. ? Drive. ? Use heavy machinery. ? Drink alcohol. ? Take sleeping pills or medicines that cause drowsiness. ? Make important decisions or sign legal documents. ? Take care of children on your own.  Rest. Eating and drinking  If you vomit, drink water, juice, or soup when you can drink without vomiting.  Drink enough fluid to keep your urine clear or pale yellow.  Make sure you have little or no nausea before eating solid foods.  Follow the diet recommended by your health care provider. General instructions  Have a responsible adult stay with you until you are awake and alert.  Return to your normal activities as told by your health care provider. Ask your health care provider what activities are safe for you.  Take over-the-counter and prescription medicines only as told by your health care provider.  If you smoke, do not smoke without supervision.  Keep all follow-up visits as told by your health care provider. This is important. Contact a health care provider if:  You continue to have nausea or vomiting at home, and medicines are not helpful.  You cannot drink fluids or start eating again.  You cannot urinate after 8-12 hours.  You develop a skin rash.  You have fever.  You have  increasing redness at the site of your procedure. Get help right away if:  You have difficulty breathing.  You have chest pain.  You have unexpected bleeding.  You feel that you are having a life-threatening or urgent problem. This information is not intended to replace advice given to you by your health care provider. Make sure you discuss any questions you have with your health care provider. Document Released: 11/06/2000 Document Revised: 01/03/2016 Document Reviewed: 07/15/2015 Elsevier Interactive Patient Education  [employer].

## 2017-01-15 ENCOUNTER — Encounter (HOSPITAL_COMMUNITY): Payer: Self-pay

## 2017-01-15 ENCOUNTER — Encounter (HOSPITAL_COMMUNITY)
Admission: RE | Admit: 2017-01-15 | Discharge: 2017-01-15 | Disposition: A | Discharge status: Home / Self Care | Payer: Medicaid Other | Source: Ambulatory Visit | Attending: Orthopedic Surgery | Admitting: Orthopedic Surgery

## 2017-01-17 ENCOUNTER — Ambulatory Visit (INDEPENDENT_AMBULATORY_CARE_PROVIDER_SITE_OTHER): Payer: Medicaid Other | Admitting: Gastroenterology

## 2017-01-17 ENCOUNTER — Encounter: Payer: Self-pay | Admitting: Gastroenterology

## 2017-01-17 DIAGNOSIS — K2970 Gastritis, unspecified, without bleeding: Secondary | ICD-10-CM | POA: Diagnosis not present

## 2017-01-17 DIAGNOSIS — B182 Chronic viral hepatitis C: Secondary | ICD-10-CM | POA: Diagnosis not present

## 2017-01-17 DIAGNOSIS — K2990 Gastroduodenitis, unspecified, without bleeding: Secondary | ICD-10-CM

## 2017-01-17 NOTE — Assessment & Plan Note (Addendum)
WITH F2-F4 FIBROSURE.  COMPLETE HEP C GENOTYPE/REFLEX. ANTICIPATE WILL NEED 3 MOS THERAPY. PT INSTRUCTED TO AVOID ETOH WHILE BEING TREATED. PATIENT VOICED HIS UNDERSTANDING. FOLLOW UP IN 4 MOS.   GREATER THAN 50% WAS SPENT IN COUNSELING & COORDINATION OF CARE WITH THE PATIENT: DISCUSSED DIAGNOSIS, PROCEDURE, BENEFITS, RISKS, AND MANAGEMENT OF HEPATITIS C. TOTAL ENCOUNTER TIME: 25 MINS.

## 2017-01-17 NOTE — Assessment & Plan Note (Signed)
SYMPTOMS CONTROLLED/RESOLVED.  CONTINUE TO MONITOR SYMPTOMS. 
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
## 2017-01-17 NOTE — Patient Instructions (Signed)
COMPLETE LABS.  I WILL CALL YOU WITH THE RESULTS AND WE WILL ENTER ORDER FOR HEP C TREATMENT.  FOLLOW UP IN 4 MOS.

## 2017-01-18 ENCOUNTER — Inpatient Hospital Stay (HOSPITAL_COMMUNITY): Admission: RE | Admit: 2017-01-18 | Payer: Medicaid Other | Source: Ambulatory Visit | Admitting: Orthopedic Surgery

## 2017-01-18 ENCOUNTER — Encounter (HOSPITAL_COMMUNITY): Admission: RE | Payer: Self-pay | Source: Ambulatory Visit

## 2017-01-18 SURGERY — ARTHROPLASTY, KNEE, TOTAL
Anesthesia: Spinal | Laterality: Right

## 2017-01-22 ENCOUNTER — Other Ambulatory Visit: Payer: Self-pay | Admitting: *Deleted

## 2017-01-22 DIAGNOSIS — Z96652 Presence of left artificial knee joint: Secondary | ICD-10-CM

## 2017-01-22 MED ORDER — HYDROCODONE-ACETAMINOPHEN 5-325 MG PO TABS: 1.0000 | ORAL_TABLET | Freq: Three times a day (TID) | ORAL | 0 refills | Status: DC | PRN

## 2017-01-22 NOTE — Telephone Encounter (Signed)
RETURNED CALL, NO ANSWER 

## 2017-01-30 ENCOUNTER — Ambulatory Visit: Payer: Medicaid Other | Admitting: Orthopedic Surgery

## 2017-01-31 ENCOUNTER — Encounter (HOSPITAL_COMMUNITY): Payer: Self-pay | Admitting: Emergency Medicine

## 2017-01-31 ENCOUNTER — Emergency Department (HOSPITAL_COMMUNITY)
Admission: EM | Admit: 2017-01-31 | Discharge: 2017-01-31 | Disposition: A | Discharge status: Home Health | Payer: Medicaid Other | Attending: Emergency Medicine | Admitting: Emergency Medicine

## 2017-01-31 DIAGNOSIS — M1000 Idiopathic gout, unspecified site: Secondary | ICD-10-CM | POA: Insufficient documentation

## 2017-01-31 DIAGNOSIS — I10 Essential (primary) hypertension: Secondary | ICD-10-CM | POA: Diagnosis not present

## 2017-01-31 DIAGNOSIS — M25572 Pain in left ankle and joints of left foot: Secondary | ICD-10-CM | POA: Diagnosis present

## 2017-01-31 MED ORDER — ONDANSETRON HCL 4 MG PO TABS
4.0000 mg | ORAL_TABLET | Freq: Once | ORAL | Status: AC
  Administered 2017-01-31: 4 mg via ORAL
  Filled 2017-01-31: qty 1

## 2017-01-31 MED ORDER — DEXAMETHASONE 4 MG PO TABS: 4.0000 mg | ORAL_TABLET | Freq: Two times a day (BID) | ORAL | 0 refills | Status: DC

## 2017-01-31 MED ORDER — HYDROCODONE-ACETAMINOPHEN 5-325 MG PO TABS: 1.0000 | ORAL_TABLET | ORAL | 0 refills | Status: DC | PRN

## 2017-01-31 MED ORDER — INDOMETHACIN 25 MG PO CAPS
25.0000 mg | ORAL_CAPSULE | Freq: Once | ORAL | Status: AC
  Administered 2017-01-31: 25 mg via ORAL
  Filled 2017-01-31: qty 1

## 2017-01-31 MED ORDER — PROMETHAZINE HCL 12.5 MG PO TABS
12.5000 mg | ORAL_TABLET | Freq: Once | ORAL | Status: AC
  Administered 2017-01-31: 12.5 mg via ORAL
  Filled 2017-01-31: qty 1

## 2017-01-31 MED ORDER — DICLOFENAC SODIUM 75 MG PO TBEC: 75.0000 mg | DELAYED_RELEASE_TABLET | Freq: Two times a day (BID) | ORAL | 0 refills | Status: DC

## 2017-01-31 MED ORDER — HYDROCODONE-ACETAMINOPHEN 5-325 MG PO TABS
2.0000 | ORAL_TABLET | Freq: Once | ORAL | Status: AC
  Administered 2017-01-31: 2 via ORAL
  Filled 2017-01-31: qty 2

## 2017-01-31 MED ORDER — COLCHICINE 0.6 MG PO TABS
0.6000 mg | ORAL_TABLET | Freq: Once | ORAL | Status: AC
  Administered 2017-01-31: 0.6 mg via ORAL
  Filled 2017-01-31: qty 1

## 2017-01-31 MED ORDER — DEXAMETHASONE SODIUM PHOSPHATE 4 MG/ML IJ SOLN
8.0000 mg | Freq: Once | INTRAMUSCULAR | Status: AC
  Administered 2017-01-31: 8 mg via INTRAMUSCULAR
  Filled 2017-01-31: qty 2
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
## 2017-01-31 NOTE — Discharge Instructions (Signed)
Please follow your gout diet closely. Use Decadron and diclofenac 2 times daily. May use Norco for more severe pain. Please make an appointment with Dr. Legrand Rams as soon as possible. Please discuss your gout outbreaks, and discuss gout maintenance with Dr. Legrand Rams.

## 2017-01-31 NOTE — ED Triage Notes (Signed)
Patient c/o right foot pain and swelling. Per patient pain from gout.

## 2017-02-03 LAB — HCV RNA,LIPA RFLX NS5A DRUG RESIST

## 2017-02-03 LAB — HCV RNA, QUANT REAL-TIME PCR W/REFLEX
HCV RNA, PCR, QN (Log): 6.62 LogIU/mL — ABNORMAL HIGH
HCV RNA, PCR, QN: 4150000 IU/mL — ABNORMAL HIGH

## 2017-02-05 LAB — HCV RNA NS5A DRUG RESISTANCE

## 2017-02-17 ENCOUNTER — Telehealth: Payer: Self-pay | Admitting: Gastroenterology

## 2017-02-17 NOTE — Telephone Encounter (Signed)
PLEASE CALL PT. HIS HEP C LABS SHOW HE HAS A STRAIN THAT IS HIGHLY LIKELY TO BE BE CURED. THE MEDS CAN CURE HIM BUT HE CAN GET RE-INFECTED AND IF SO INSURANCE COMPANIES WILL NOT PAY FOR A SECOND ROUND OF THERAPY BECAUSE THE MEDS COST >$100K. WE WILL COMPLETE HIS PAPERWORK TO START THERAPY WHEN HE CONFIRMS HE IS READY TO STOP DRINKING ALL ALCOHOL FOR 3 MOS TO COMPLETE HIS THERAPY.

## 2017-02-19 NOTE — Telephone Encounter (Signed)
PT is aware but says he is ready to start now.  He is ready to quit the alcohol for 3 months.

## 2017-04-05 ENCOUNTER — Telehealth: Payer: Self-pay | Admitting: Gastroenterology

## 2017-04-05 NOTE — Telephone Encounter (Signed)
Dr Josephine Cables office called saying that Dr Legrand Rams wanted to know the treatment plan for this patient with Hep C. I told Maudie Mercury that the nurse was on the phone with another patient and I would have her call back. She said to either call or fax what the plan was. HIs office number is [phone] and fax is [phone]

## 2017-04-05 NOTE — Telephone Encounter (Signed)
Forwarding to Dr.Fields.  

## 2017-04-05 NOTE — Telephone Encounter (Signed)
REVIEWED-NO ADDITIONAL RECOMMENDATIONS. 

## 2017-04-06 NOTE — Telephone Encounter (Signed)
Dr. Oneida Alar, please see phone note of 02/17/2017.

## 2017-04-08 NOTE — Telephone Encounter (Signed)
PLEASE CALL DR. FANTA. WILL SUBMIT PAPERWORK FOR HEP C TREATMENT THIS WEEK.

## 2017-04-09 NOTE — Telephone Encounter (Signed)
done

## 2017-06-07 ENCOUNTER — Telehealth: Payer: Self-pay | Admitting: Gastroenterology

## 2017-06-07 NOTE — Telephone Encounter (Signed)
PAPERWORK SUBMITTED.

## 2017-06-18 ENCOUNTER — Emergency Department (HOSPITAL_COMMUNITY)
Admission: EM | Admit: 2017-06-18 | Discharge: 2017-06-18 | Disposition: A | Discharge status: Home / Self Care | Payer: Medicaid Other | Attending: Emergency Medicine | Admitting: Emergency Medicine

## 2017-06-18 ENCOUNTER — Encounter (HOSPITAL_COMMUNITY): Payer: Self-pay | Admitting: Emergency Medicine

## 2017-06-18 DIAGNOSIS — M10072 Idiopathic gout, left ankle and foot: Secondary | ICD-10-CM | POA: Insufficient documentation

## 2017-06-18 DIAGNOSIS — M25572 Pain in left ankle and joints of left foot: Secondary | ICD-10-CM | POA: Diagnosis present

## 2017-06-18 DIAGNOSIS — M109 Gout, unspecified: Secondary | ICD-10-CM

## 2017-06-18 DIAGNOSIS — I10 Essential (primary) hypertension: Secondary | ICD-10-CM | POA: Insufficient documentation

## 2017-06-18 DIAGNOSIS — Z79899 Other long term (current) drug therapy: Secondary | ICD-10-CM | POA: Diagnosis not present

## 2017-06-18 MED ORDER — DEXAMETHASONE SODIUM PHOSPHATE 10 MG/ML IJ SOLN
10.0000 mg | Freq: Once | INTRAMUSCULAR | Status: AC
  Administered 2017-06-18: 10 mg via INTRAMUSCULAR
  Filled 2017-06-18: qty 1

## 2017-06-18 MED ORDER — COLCHICINE 0.6 MG PO TABS
0.6000 mg | ORAL_TABLET | Freq: Once | ORAL | Status: AC
  Administered 2017-06-18: 0.6 mg via ORAL
  Filled 2017-06-18: qty 1

## 2017-06-18 MED ORDER — COLCHICINE 0.6 MG PO TABS: 0.6000 mg | ORAL_TABLET | Freq: Two times a day (BID) | ORAL | 0 refills | Status: DC

## 2017-06-18 NOTE — ED Notes (Signed)
Pt refused to wait.  

## 2017-06-18 NOTE — Discharge Instructions (Signed)
Return to the ER for increased pain, swelling, redness or fevers  Colchicine twice daily

## 2017-06-18 NOTE — ED Provider Notes (Signed)
[facility] EMERGENCY DEPARTMENT Provider Note   CSN: [id number] Arrival date & time: 06/18/17  0350     History   Chief Complaint Chief Complaint  Patient presents with  . Ankle Pain    HPI DNAIEL VOLLER is a 56 y.o. male.  HPI   Hx of gout  Awoke this AM with L ankle pain - same place he always gets it Not on prophylactic meds Denies fevesr or any other sx, including No fevers, chills, headache, sore throat, visual changes, neck pain, back pain, chest pain, abdominal pain, shortness of breath, cough, dysuria, diarrhea, rectal bleeding, swelling, rashes, numbness or weakness.  No meds PTA Worse with ambulation Constant pain Mild welling of L ankle as well.  Past Medical History:  Diagnosis Date  . Arthritis   . Gout   . Hep C w/o coma, chronic ([place])    AWARE 2018  . Hypertension    pt states, "I have high blood pressure byt my Doctor has never put me on any medicine for it.    Patient Active Problem List   Diagnosis Date Noted  . Gastritis and gastroduodenitis   . Chronic hepatitis C virus infection ([place]) 10/11/2016  . Special screening for malignant neoplasms, colon 10/11/2016  . Primary osteoarthritis of left knee 04/06/2016  . Puncture wound of lower leg without foreign body   . Visit for wound care     Past Surgical History:  Procedure Laterality Date  . KNEE SURGERY Bilateral    x4       Home Medications    Prior to Admission medications   Medication Sig Start Date End Date Taking? Authorizing Provider  colchicine 0.6 MG tablet Take 1 tablet (0.6 mg total) 2 (two) times daily by mouth. 06/18/17   Noemi Chapel, MD  dexamethasone (DECADRON) 4 MG tablet Take 1 tablet (4 mg total) by mouth 2 (two) times daily with a meal. 01/31/17   Lily Kocher, PA-C  diclofenac (VOLTAREN) 75 MG EC tablet Take 1 tablet (75 mg total) by mouth 2 (two) times daily. 01/31/17   Lily Kocher, PA-C  HYDROcodone-acetaminophen (NORCO/VICODIN) 5-325 MG tablet Take 1  tablet by mouth every 4 (four) hours as needed. 01/31/17   Lily Kocher, PA-C    Family History Family History  Problem Relation Age of Onset  . Alcohol abuse Father     Social History Social History   Tobacco Use  . Smoking status: Never Smoker  . Smokeless tobacco: Never Used  Substance Use Topics  . Alcohol use: Yes    Alcohol/week: 4.2 oz    Types: 7 Cans of beer per week    Comment: occ  . Drug use: No     Allergies   Patient has no known allergies.   Review of Systems Review of Systems  All other systems reviewed and are negative.    Physical Exam Updated Vital Signs BP (!) 119/94 (BP Location: Right Arm)   Pulse 85   Temp 100.3 F (37.9 C) (Oral)   Resp 18   Ht 6\' 3"  (1.905 m)   Wt 106.6 kg (235 lb)   SpO2 95%   BMI 29.37 kg/m   Physical Exam  Constitutional: He appears well-developed and well-nourished.  HENT:  Head: Normocephalic and atraumatic.  Eyes: Conjunctivae are normal. Right eye exhibits no discharge. Left eye exhibits no discharge.  Pulmonary/Chest: Effort normal. No respiratory distress.  Musculoskeletal:  All joints are supple except for the L ankle which is normal in  appearance but has dec ROM.  No wamrth, redness or obvious welling.  Neurological: He is alert. Coordination normal.  Skin: Skin is warm and dry. No rash noted. He is not diaphoretic. No erythema.  Psychiatric: He has a normal mood and affect.  Nursing note and vitals reviewed.    ED Treatments / Results  Labs (all labs ordered are listed, but only abnormal results are displayed) Labs Reviewed - No data to display  Radiology No results found.  Procedures Procedures (including critical care time)  Medications Ordered in ED Medications  dexamethasone (DECADRON) injection 10 mg (not administered)  colchicine tablet 0.6 mg (not administered)     Initial Impression / Assessment and Plan / ED Course  I have reviewed the triage vital signs and the nursing  notes.  Pertinent labs & imaging results that were available during my care of the patient were reviewed by me and considered in my medical decision making (see chart for details).     Consider gout likely No fevers VShere with temp of 100.3 No recent surgery or manipulation Treat for gout Given f/u instructions if worsens Pt agreeable.  Final Clinical Impressions(s) / ED Diagnoses   Final diagnoses:  Acute gout of left ankle, unspecified cause    ED Discharge Orders        Ordered    colchicine 0.6 MG tablet  2 times daily     06/18/17 1147       Noemi Chapel, MD 06/18/17 1149

## 2017-06-18 NOTE — ED Triage Notes (Signed)
PT c/o left ankle pain with hx of gout to same joint that started this am. PT denies any recent injury and denies any further complaints.

## 2017-07-09 ENCOUNTER — Telehealth: Payer: Self-pay | Admitting: Gastroenterology

## 2017-07-09 DIAGNOSIS — B182 Chronic viral hepatitis C: Secondary | ICD-10-CM

## 2017-07-09 NOTE — Telephone Encounter (Signed)
[phone] ext [phone] patient assistance called wanting to know if you received the forms back from the patient

## 2017-07-09 NOTE — Telephone Encounter (Signed)
The patient has not picked up his form from the office yet. It is still in the drawer at the front desk. I have mailed the form to him.

## 2017-07-25 NOTE — Telephone Encounter (Signed)
I spoke with Tyler Hart at [facility], she has scheduled delivery for Tuesday-07/31/17. Can this wait until [facility] gets back next week or can you give me instructions for the pt now?

## 2017-07-25 NOTE — Telephone Encounter (Signed)
Received a Vm from [facility], they need to schedule a delivery date for Harvoni. They can be reached at [phone].

## 2017-07-26 ENCOUNTER — Encounter: Payer: Self-pay | Admitting: Gastroenterology

## 2017-07-26 ENCOUNTER — Other Ambulatory Visit: Payer: Self-pay

## 2017-07-26 DIAGNOSIS — B182 Chronic viral hepatitis C: Secondary | ICD-10-CM

## 2017-07-26 NOTE — Addendum Note (Signed)
Addended by: Claudina Lick on: 07/26/2017 12:08 PM   Modules accepted: Orders

## 2017-07-26 NOTE — Telephone Encounter (Signed)
Lab orders done, instruction letter and medication letter done. Medication will be here next week.   Please schedule ov in 4 weeks.

## 2017-07-26 NOTE — Telephone Encounter (Signed)
Will they be delivering here for patient to pick up?  Need to find out if taking a PPI or anything else acid-related OTC.   1. No PPI during therapy. If needs PPI, would need to take at same time as Harvoni. Would then do Prilosec 20 mg. However, it does not appear he is on one, but just reinforce this. Do not take OTC acid reducers or other vitamins or supplements during treatment. Call us first if any questions.   2. Absolutely no alcohol during treatment. This could interfere with medication and affect cure.   3. Will need an office visit 4 weeks into therapy. Will have labs that will be needed at that time including HCV RNA, CMP, INR, CBC.   Copying Dr. Oneida Alar for any additional items to add.

## 2017-07-26 NOTE — Telephone Encounter (Signed)
PATIENT SCHEDULED IN 4 WEEKS WITH [facility]

## 2017-07-31 ENCOUNTER — Telehealth: Payer: Self-pay

## 2017-07-31 NOTE — Telephone Encounter (Signed)
LMOM, waiting on a return call.  Pt returned call before I finished note. Pt is aware that his Harvoni is in. Pt is aware that he has to sign some papers , have lab work in 4 weeks after starting medication and sign paper work.  Pt will be in this week to pick medication up.

## 2017-07-31 NOTE — Telephone Encounter (Signed)
Pt called back, he would like to wait until after January 1st to start his medication. I advised him that we can keep his medication locked up in our office but he would need to come in on January 2nd to get it so he could get started on it and he would have to be aware of when his refills are so he doesn't go beyond the dates of his approval for his insurance. Medication is locked up in back storage room. Pt stated he understood everything I have told him.   Erline Levine, can you change his appt to 4 weeks from Jan 1st please.

## 2017-07-31 NOTE — Telephone Encounter (Signed)
REVIEWED-NO ADDITIONAL RECOMMENDATIONS. 

## 2017-08-01 ENCOUNTER — Encounter: Payer: Self-pay | Admitting: Gastroenterology

## 2017-08-01 NOTE — Telephone Encounter (Signed)
Rescheduled his appointment to 09/19/17 and sent him a letter

## 2017-08-15 ENCOUNTER — Telehealth: Payer: Self-pay

## 2017-08-15 NOTE — Telephone Encounter (Signed)
Pt picked Harvoni 90 mg/400mg  up in office as planned. Pt has a 4 week f/u and will have labs done 4 weeks to starting medication. Pt is aware that he will contact [facility] for any refills 2 weeks before medication runs out.

## 2017-09-03 DIAGNOSIS — Z96652 Presence of left artificial knee joint: Secondary | ICD-10-CM | POA: Insufficient documentation

## 2017-09-04 ENCOUNTER — Ambulatory Visit: Payer: Medicare Other | Admitting: Orthopedic Surgery

## 2017-09-04 ENCOUNTER — Ambulatory Visit (INDEPENDENT_AMBULATORY_CARE_PROVIDER_SITE_OTHER): Payer: Medicare Other

## 2017-09-04 VITALS — BP 131/88 | HR 72 | Ht 74.0 in | Wt 223.0 lb

## 2017-09-04 DIAGNOSIS — Z96652 Presence of left artificial knee joint: Secondary | ICD-10-CM | POA: Diagnosis not present

## 2017-09-04 DIAGNOSIS — G8929 Other chronic pain: Secondary | ICD-10-CM | POA: Diagnosis not present

## 2017-09-04 DIAGNOSIS — M79605 Pain in left leg: Secondary | ICD-10-CM | POA: Diagnosis not present

## 2017-09-04 DIAGNOSIS — S8412XS Injury of peroneal nerve at lower leg level, left leg, sequela: Secondary | ICD-10-CM | POA: Diagnosis not present

## 2017-09-04 DIAGNOSIS — M5442 Lumbago with sciatica, left side: Secondary | ICD-10-CM | POA: Diagnosis not present

## 2017-09-04 MED ORDER — GABAPENTIN 300 MG PO CAPS: 300.0000 mg | ORAL_CAPSULE | Freq: Three times a day (TID) | ORAL | 5 refills | Status: AC

## 2017-09-04 MED ORDER — PREDNISONE 10 MG (48) PO TBPK: ORAL_TABLET | Freq: Every day | ORAL | 1 refills | Status: DC

## 2017-09-04 NOTE — Progress Notes (Signed)
am
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
## 2017-09-05 NOTE — Progress Notes (Signed)
CC'D TO PCP °

## 2017-09-06 ENCOUNTER — Ambulatory Visit: Payer: Medicaid Other | Admitting: Gastroenterology

## 2017-09-06 LAB — COMPLETE METABOLIC PANEL WITH GFR
AG Ratio: 1.1 (calc) (ref 1.0–2.5)
ALT: 22 U/L (ref 9–46)
AST: 27 U/L (ref 10–35)
Albumin: 4.3 g/dL (ref 3.6–5.1)
Alkaline phosphatase (APISO): 48 U/L (ref 40–115)
BUN: 9 mg/dL (ref 7–25)
CO2: 28 mmol/L (ref 20–32)
CREATININE: 0.88 mg/dL (ref 0.70–1.33)
Calcium: 10.4 mg/dL — ABNORMAL HIGH (ref 8.6–10.3)
Chloride: 104 mmol/L (ref 98–110)
GFR, Est African American: 113 mL/min/{1.73_m2} (ref >=60)
GFR, Est Non African American: 97 mL/min/{1.73_m2} (ref >=60)
GLOBULIN: 3.8 g/dL — AB (ref 1.9–3.7)
Glucose, Bld: 71 mg/dL (ref 65–139)
POTASSIUM: 4.2 mmol/L (ref 3.5–5.3)
SODIUM: 139 mmol/L (ref 135–146)
Total Bilirubin: 0.8 mg/dL (ref 0.2–1.2)
Total Protein: 8.1 g/dL (ref 6.1–8.1)

## 2017-09-06 LAB — CBC WITH DIFFERENTIAL/PLATELET
BASOS PCT: 1.2 %
Basophils Absolute: 82 cells/uL (ref 0–200)
EOS ABS: 211 {cells}/uL (ref 15–500)
Eosinophils Relative: 3.1 %
HCT: 47 % (ref 38.5–50.0)
Hemoglobin: 16.6 g/dL (ref 13.2–17.1)
Lymphs Abs: 2734 cells/uL (ref 850–3900)
MCH: 32.5 pg (ref 27.0–33.0)
MCHC: 35.3 g/dL (ref 32.0–36.0)
MCV: 92 fL (ref 80.0–100.0)
MPV: 12.2 fL (ref 7.5–12.5)
Monocytes Relative: 13.5 %
Neutro Abs: 2856 cells/uL (ref 1500–7800)
Neutrophils Relative %: 42 %
PLATELETS: 190 10*3/uL (ref 140–400)
RBC: 5.11 10*6/uL (ref 4.20–5.80)
RDW: 12.4 % (ref 11.0–15.0)
TOTAL LYMPHOCYTE: 40.2 %
WBC mixed population: 918 cells/uL (ref 200–950)
WBC: 6.8 10*3/uL (ref 3.8–10.8)

## 2017-09-06 LAB — HEPATITIS C RNA QUANTITATIVE
HCV Quantitative Log: <1.18 Log IU/mL
HCV RNA, PCR, QN: <15 IU/mL

## 2017-09-06 LAB — PROTIME-INR
INR: 1
Prothrombin Time: 11 s (ref 9.0–11.5)

## 2017-09-06 NOTE — Progress Notes (Signed)
PT is aware.

## 2017-09-12 ENCOUNTER — Telehealth: Payer: Self-pay | Admitting: Orthopedic Surgery

## 2017-09-12 NOTE — Telephone Encounter (Signed)
Patient states his back pain is getting worse. He indicates he starts physical therapy on Monday. He wants you to know the Dosepack and the Gabapentin are not helping him, he has asked for pain medication. I told him you have restrictions about what you can prescribe, please advise.

## 2017-09-12 NOTE — Telephone Encounter (Signed)
Okay we can start ibuprofen 800 mg 3 times a day #9

## 2017-09-12 NOTE — Telephone Encounter (Signed)
Tyler Hart states that he has been seen for his back and that the Rx he was given is not helping.  Im not sure what medication he is requesting.  Would you speak with the patient about what medication he thinks may work for him?  Thanks so much

## 2017-09-13 MED ORDER — IBUPROFEN 800 MG PO TABS: 800.0000 mg | ORAL_TABLET | Freq: Three times a day (TID) | ORAL | 1 refills | Status: DC | PRN

## 2017-09-13 NOTE — Telephone Encounter (Signed)
I have called patient to advise.  

## 2017-09-17 ENCOUNTER — Ambulatory Visit (HOSPITAL_COMMUNITY): Payer: Medicare Other | Attending: Orthopedic Surgery

## 2017-09-17 DIAGNOSIS — M545 Low back pain, unspecified: Secondary | ICD-10-CM

## 2017-09-17 DIAGNOSIS — R262 Difficulty in walking, not elsewhere classified: Secondary | ICD-10-CM | POA: Insufficient documentation

## 2017-09-17 DIAGNOSIS — M25562 Pain in left knee: Secondary | ICD-10-CM | POA: Diagnosis present

## 2017-09-17 DIAGNOSIS — G8929 Other chronic pain: Secondary | ICD-10-CM | POA: Diagnosis present
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
## 2017-09-19 ENCOUNTER — Ambulatory Visit (INDEPENDENT_AMBULATORY_CARE_PROVIDER_SITE_OTHER): Payer: Medicare Other | Admitting: Gastroenterology

## 2017-09-19 ENCOUNTER — Encounter: Payer: Self-pay | Admitting: Gastroenterology

## 2017-09-19 DIAGNOSIS — K2970 Gastritis, unspecified, without bleeding: Secondary | ICD-10-CM

## 2017-09-19 DIAGNOSIS — K7460 Unspecified cirrhosis of liver: Secondary | ICD-10-CM

## 2017-09-19 DIAGNOSIS — K2990 Gastroduodenitis, unspecified, without bleeding: Secondary | ICD-10-CM

## 2017-09-19 DIAGNOSIS — B182 Chronic viral hepatitis C: Secondary | ICD-10-CM

## 2017-09-19 NOTE — Patient Instructions (Addendum)
  DRINK WATER TO KEEP YOUR URINE LIGHT YELLOW.  CONTINUE YOUR WEIGHT LOSS EFFORTS. WHILE I DO NOT WANT TO ALARM YOU, YOUR BODY MASS INDEX IS OVER 30 WHICH MEANS YOU ARE OBESE. OBESITY IS ASSOCIATED WITH AN INCREASED RISK FOR CIRRHOSIS AND ALL CANCERS, INCLUDING ESOPHAGEAL AND COLON CANCER. A WEIGHT OF 230 LBS OR LESS  WILL KEEP YOUR BODY MASS INDEX(BMI) UNDER 30.    COMPLETE ULTRASOUND within 7 days.  PLEASE CALL when  You take your last dose of HARVONI. YOU WILL NEED A HEPATITIS C BLOOD TEST 3 MOS AFTER FINISHING THE HARVONI.  FOLLOW UP IN 6 MOS.

## 2017-09-19 NOTE — Patient Instructions (Signed)
Attempted to submit PA for [place] abd RUQ via [facility]. No PA needed for outpt radiology.
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
## 2017-09-19 NOTE — Progress Notes (Signed)
ON RECALL  °

## 2017-09-19 NOTE — Assessment & Plan Note (Signed)
SYMPTOMS CONTROLLED/RESOLVED.  CONTINUE TO MONITOR SYMPTOMS. 
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
## 2017-09-19 NOTE — Progress Notes (Signed)
CC'D TO PCP °

## 2017-09-19 NOTE — Assessment & Plan Note (Addendum)
TOLERATING THERAPY. AVOIDING ETOH.  AVOID ETOH. PATIENT VOICED HIS UNDERSTANDING. COMPLETE HARVONI. HCV VL 12 WEEKS AFTER THERAPY ENDS. FOLLOW UP IN 6 MOS.

## 2017-09-21 ENCOUNTER — Telehealth (HOSPITAL_COMMUNITY): Payer: Self-pay

## 2017-09-21 NOTE — Telephone Encounter (Signed)
medicaid approved 3 visit  2/5-2/25/2019 NF 09/21/2017 Called to schedule l/m NF 09/21/17

## 2017-09-27 ENCOUNTER — Other Ambulatory Visit: Payer: Self-pay | Admitting: Gastroenterology

## 2017-09-27 ENCOUNTER — Ambulatory Visit (HOSPITAL_COMMUNITY)
Admission: RE | Admit: 2017-09-27 | Discharge: 2017-09-27 | Disposition: A | Discharge status: Home / Self Care | Payer: Medicare Other | Source: Ambulatory Visit | Attending: Gastroenterology | Admitting: Gastroenterology

## 2017-09-27 DIAGNOSIS — B182 Chronic viral hepatitis C: Secondary | ICD-10-CM | POA: Diagnosis present

## 2017-09-27 DIAGNOSIS — K7460 Unspecified cirrhosis of liver: Secondary | ICD-10-CM | POA: Insufficient documentation

## 2017-09-28 NOTE — Progress Notes (Signed)
PT called and is aware.

## 2017-09-28 NOTE — Progress Notes (Signed)
LMOM to call.

## 2017-10-02 ENCOUNTER — Ambulatory Visit (HOSPITAL_COMMUNITY): Payer: Medicare Other

## 2017-10-02 ENCOUNTER — Encounter (HOSPITAL_COMMUNITY): Payer: Self-pay

## 2017-10-02 DIAGNOSIS — M545 Low back pain, unspecified: Secondary | ICD-10-CM

## 2017-10-02 DIAGNOSIS — G8929 Other chronic pain: Secondary | ICD-10-CM

## 2017-10-02 DIAGNOSIS — R262 Difficulty in walking, not elsewhere classified: Secondary | ICD-10-CM

## 2017-10-02 NOTE — Patient Instructions (Signed)
Hamstring Step 3    Left leg in maximal straight leg raise, heel at maximal stretch, straighten knee further by tightening knee cap. Warning: Intense stretch. Stay within tolerance. Hold 30 seconds. Relax knee cap only. Repeat 3 times.  Copyright  VHI. All rights reserved.   
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
## 2017-10-03 ENCOUNTER — Telehealth: Payer: Self-pay | Admitting: Gastroenterology

## 2017-10-03 NOTE — Telephone Encounter (Signed)
Pt said he has not drank any since he made the call. He found 6 more Harvoni pills in his drawer and he will take them first. However, he said he was called today and has another shipment of the [place]. He wants to know if he needs to complete that if his blood work is already good. Please advise!

## 2017-10-03 NOTE — Telephone Encounter (Signed)
LMOM to call.

## 2017-10-03 NOTE — Telephone Encounter (Signed)
Patient called to let the nurse and SF know that he took his last harvoni pill today and wants to know if he can drink a beer. He said he has gone 2 months without and now that he is finished with taking harvoni could he drink one. I told him if he has gone 2 months without why mess up now. That he should keep going. He would like for the nurse to call him.  Please call  [phone]

## 2017-10-03 NOTE — Telephone Encounter (Signed)
PLEASE CALL PT. He needs to complete the Harvoni treatment. NO ALCOHOL UNTIL THE HARVONI TREATMENT IS COMPLETE.

## 2017-10-04 ENCOUNTER — Ambulatory Visit (HOSPITAL_COMMUNITY): Payer: Medicare Other

## 2017-10-04 DIAGNOSIS — M545 Low back pain, unspecified: Secondary | ICD-10-CM

## 2017-10-04 DIAGNOSIS — R262 Difficulty in walking, not elsewhere classified: Secondary | ICD-10-CM

## 2017-10-04 DIAGNOSIS — G8929 Other chronic pain: Secondary | ICD-10-CM

## 2017-10-04 DIAGNOSIS — M25562 Pain in left knee: Secondary | ICD-10-CM
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
## 2017-10-04 NOTE — Telephone Encounter (Signed)
Pt is aware and said the Harvoni is supposed to be delivered in a couple of days.

## 2017-10-08 ENCOUNTER — Ambulatory Visit (HOSPITAL_COMMUNITY): Payer: Medicare Other

## 2017-10-08 ENCOUNTER — Encounter (HOSPITAL_COMMUNITY): Payer: Self-pay

## 2017-10-08 DIAGNOSIS — R262 Difficulty in walking, not elsewhere classified: Secondary | ICD-10-CM

## 2017-10-08 DIAGNOSIS — M545 Low back pain: Principal | ICD-10-CM

## 2017-10-08 DIAGNOSIS — G8929 Other chronic pain: Secondary | ICD-10-CM
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
## 2017-10-08 NOTE — Patient Instructions (Signed)
  Seated Lumbar Flexion Stretch  Start sitting on a chair with good posture, reach arms towards the floor until you feel a stretch in your lower back and hold.  Perform with other exercises, 3-5 stretches holding for 30-60 seconds each

## 2017-10-09 ENCOUNTER — Encounter (HOSPITAL_COMMUNITY): Payer: Medicare Other

## 2017-10-15 ENCOUNTER — Encounter (HOSPITAL_COMMUNITY): Payer: Self-pay

## 2017-10-15 ENCOUNTER — Ambulatory Visit (HOSPITAL_COMMUNITY): Payer: Medicare Other | Attending: Orthopedic Surgery

## 2017-10-15 DIAGNOSIS — M545 Low back pain: Secondary | ICD-10-CM | POA: Insufficient documentation

## 2017-10-15 DIAGNOSIS — R262 Difficulty in walking, not elsewhere classified: Secondary | ICD-10-CM | POA: Diagnosis present

## 2017-10-15 DIAGNOSIS — G8929 Other chronic pain: Secondary | ICD-10-CM | POA: Insufficient documentation
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
## 2017-10-17 ENCOUNTER — Ambulatory Visit (HOSPITAL_COMMUNITY): Payer: Medicare Other

## 2017-10-17 ENCOUNTER — Encounter (HOSPITAL_COMMUNITY): Payer: Self-pay

## 2017-10-17 DIAGNOSIS — G8929 Other chronic pain: Secondary | ICD-10-CM

## 2017-10-17 DIAGNOSIS — R262 Difficulty in walking, not elsewhere classified: Secondary | ICD-10-CM

## 2017-10-17 DIAGNOSIS — M545 Low back pain: Secondary | ICD-10-CM | POA: Diagnosis not present
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
## 2017-10-23 ENCOUNTER — Ambulatory Visit (HOSPITAL_COMMUNITY): Payer: Medicare Other

## 2017-10-23 ENCOUNTER — Encounter (HOSPITAL_COMMUNITY): Payer: Self-pay

## 2017-10-23 DIAGNOSIS — G8929 Other chronic pain: Secondary | ICD-10-CM

## 2017-10-23 DIAGNOSIS — R262 Difficulty in walking, not elsewhere classified: Secondary | ICD-10-CM

## 2017-10-23 DIAGNOSIS — M545 Low back pain: Principal | ICD-10-CM
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
## 2017-10-25 ENCOUNTER — Encounter (HOSPITAL_COMMUNITY): Payer: Self-pay

## 2017-10-25 ENCOUNTER — Ambulatory Visit (HOSPITAL_COMMUNITY): Payer: Medicare Other

## 2017-10-25 DIAGNOSIS — M545 Low back pain: Principal | ICD-10-CM

## 2017-10-25 DIAGNOSIS — R262 Difficulty in walking, not elsewhere classified: Secondary | ICD-10-CM

## 2017-10-25 DIAGNOSIS — G8929 Other chronic pain: Secondary | ICD-10-CM
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
## 2017-10-30 ENCOUNTER — Ambulatory Visit (HOSPITAL_COMMUNITY): Payer: Medicare Other

## 2017-10-30 DIAGNOSIS — G8929 Other chronic pain: Secondary | ICD-10-CM

## 2017-10-30 DIAGNOSIS — M545 Low back pain: Secondary | ICD-10-CM | POA: Diagnosis not present

## 2017-10-30 DIAGNOSIS — R262 Difficulty in walking, not elsewhere classified: Secondary | ICD-10-CM

## 2017-10-30 NOTE — Patient Instructions (Signed)
  BRIDGING  While lying on your back with knees bent, tighten your lower abdominals, squeeze your buttocks and then raise your buttocks off the floor/bed as creating a "Bridge" with your body. Hold and then lower yourself and repeat.  Perform 1x/day, 2-3 sets of 10-15 reps    STRAIGHT LEG RAISE - SLR  While lying on your back, raise up your leg with a straight knee.  Keep the opposite knee bent with the foot planted on the ground.  Perform 1x/day, 2-3 sets of 10-15 reps    SIT TO STAND - NO SUPPORT  Start by scooting close to the front of the chair.  Next, lean forward at your trunk and reach forward with your arms and rise to standing without using your hands to push off from the chair or other object.   Use your arms as a counter-balance by reaching forward when in sitting and lower them as you approach standing.   Perform 1x/day, 2-3 sets of 10-15 reps    lateral walk at pole or counter   Standing at a support surface (role or counter top) use hands to assist with balance. Perform sideways walking down and back until you feel that you need to take break.   It may be help to place a chair close to allow for safe rest breaks.   Use blue band around ankles.  Perform 5-10 laps at kitchen counter or down hallway   LOOPED ELASTIC BAND HIP ABDUCTION  While standing with an elastic band looped around your ankles, move the target leg out to the side as shown.   Perform 1x/day, 2-3 sets of 10-15 reps with blue band on each leg
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
## 2017-10-31 ENCOUNTER — Ambulatory Visit (INDEPENDENT_AMBULATORY_CARE_PROVIDER_SITE_OTHER): Payer: Medicare Other | Admitting: Orthopedic Surgery

## 2017-10-31 VITALS — BP 117/76 | HR 75 | Ht 74.0 in | Wt 239.0 lb

## 2017-10-31 DIAGNOSIS — M1711 Unilateral primary osteoarthritis, right knee: Secondary | ICD-10-CM | POA: Diagnosis not present

## 2017-10-31 DIAGNOSIS — G8929 Other chronic pain: Secondary | ICD-10-CM | POA: Diagnosis not present

## 2017-10-31 DIAGNOSIS — M5442 Lumbago with sciatica, left side: Secondary | ICD-10-CM

## 2017-10-31 DIAGNOSIS — Z96652 Presence of left artificial knee joint: Secondary | ICD-10-CM | POA: Diagnosis not present

## 2017-10-31 NOTE — Progress Notes (Signed)
Progress Note   Patient ID: Tyler Hart, male   DOB: 02-12-1961, 57 y.o.   MRN: [id number]  Chief Complaint  Patient presents with  . Follow-up    2 month recheck on back and left knee    57 years old status post left total knee replacement with chronic lower back pain with radicular leg pain, chronic pain, osteoarthritis right knee presents for follow-up after physical therapy for his lower back with no improvement  He has a right knee which is arthritic and will need surgery  He was recently referred to the [facility] medical clinic for chronic pain management and started on oxycodone 10 mg 4 times a day as well as diclofenac 75 mg a day and he continues with gabapentin 300 mg as well.     Review of Systems  Constitutional: Negative for chills and fever.  Genitourinary: Negative.   Musculoskeletal: Negative.   Neurological: Negative for tremors.   No outpatient medications have been marked as taking for the 10/31/17 encounter (Office Visit) with Carole Civil, MD.    No Known Allergies   BP 117/76   Pulse 75   Ht 6\' 2"  (1.88 m)   Wt 239 lb (108.4 kg)   BMI 30.69 kg/m   Physical Exam  Constitutional: He is oriented to person, place, and time. He appears well-developed and well-nourished.  Vital signs have been reviewed and are stable. Gen. appearance the patient is well-developed and well-nourished with normal grooming and hygiene.   Musculoskeletal:       Lumbar back: He exhibits decreased range of motion, tenderness, bony tenderness and deformity. He exhibits no swelling, no edema, no laceration, no pain, no spasm and normal pulse.       Back:  Neurological: He is alert and oriented to person, place, and time.  Skin: Skin is warm and dry. No erythema.  Psychiatric: He has a normal mood and affect.  Vitals reviewed.    Medical decision-making Encounter Diagnoses  Name Primary?  . S/P total knee replacement, 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 Yes  . Primary osteoarthritis  of right knee   . Chronic left-sided low back pain with left-sided sciatica    I will call the physical therapy department to address the issues regarding the exercises they are doing for his rehab  I will also speak to [place] to advise them that he will need a opioid holiday when it comes time for his knee replacement and coordinate with them postoperative pain management  He would also like him to have an MRI of his lower back to see if we can get him some injections to relieve some of his radicular pain     Arther Abbott, MD 10/31/2017 2:59 PM

## 2017-11-01 ENCOUNTER — Telehealth: Payer: Self-pay | Admitting: Radiology

## 2017-11-01 ENCOUNTER — Encounter (HOSPITAL_COMMUNITY): Payer: Self-pay

## 2017-11-01 ENCOUNTER — Ambulatory Visit (HOSPITAL_COMMUNITY): Payer: Medicare Other

## 2017-11-01 DIAGNOSIS — M545 Low back pain: Secondary | ICD-10-CM | POA: Diagnosis not present

## 2017-11-01 DIAGNOSIS — R262 Difficulty in walking, not elsewhere classified: Secondary | ICD-10-CM

## 2017-11-01 DIAGNOSIS — G8929 Other chronic pain: Secondary | ICD-10-CM
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
## 2017-11-01 NOTE — Telephone Encounter (Signed)
Spoke to physical therapy x2 today Tyler Hart has been working with him, she wants to know specifically what you want in regards to therapy. I have advised you have wanted them to focus more on his lumbar spine, but do you have specific instructions ?

## 2017-11-05 ENCOUNTER — Telehealth: Payer: Self-pay

## 2017-11-05 ENCOUNTER — Other Ambulatory Visit: Payer: Self-pay

## 2017-11-05 DIAGNOSIS — B182 Chronic viral hepatitis C: Secondary | ICD-10-CM

## 2017-11-05 NOTE — Telephone Encounter (Signed)
Pt is aware. Lab order on file for 02/06/2018. Pt is aware the order will be mailed to him and not to do before 02/05/2018. He is aware he will be contacted about the appt in August.

## 2017-11-05 NOTE — Telephone Encounter (Signed)
PLEASE CALL PT. His U/S in Feb 2019 did not show cirrhosis. HE NEEDS HEP C VIRAL LOAD AFTER JUN 25. OPV AUG 2019 E30 HEP C/ETOH ABUSE.

## 2017-11-05 NOTE — Telephone Encounter (Signed)
Pt called to say he completed the [place] today.

## 2017-11-05 NOTE — Telephone Encounter (Signed)
On recall  °

## 2017-11-06 ENCOUNTER — Ambulatory Visit (HOSPITAL_COMMUNITY): Payer: Medicare Other

## 2017-11-06 ENCOUNTER — Encounter (HOSPITAL_COMMUNITY): Payer: Self-pay

## 2017-11-06 DIAGNOSIS — R262 Difficulty in walking, not elsewhere classified: Secondary | ICD-10-CM

## 2017-11-06 DIAGNOSIS — M545 Low back pain: Principal | ICD-10-CM

## 2017-11-06 DIAGNOSIS — G8929 Other chronic pain: Secondary | ICD-10-CM
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
## 2017-11-08 ENCOUNTER — Ambulatory Visit (HOSPITAL_COMMUNITY): Payer: Medicare Other

## 2017-11-08 ENCOUNTER — Encounter (HOSPITAL_COMMUNITY): Payer: Self-pay

## 2017-11-08 DIAGNOSIS — G8929 Other chronic pain: Secondary | ICD-10-CM

## 2017-11-08 DIAGNOSIS — R262 Difficulty in walking, not elsewhere classified: Secondary | ICD-10-CM

## 2017-11-08 DIAGNOSIS — M545 Low back pain: Principal | ICD-10-CM
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
## 2017-11-13 ENCOUNTER — Ambulatory Visit (HOSPITAL_COMMUNITY): Payer: Medicare Other | Attending: Orthopedic Surgery

## 2017-11-13 ENCOUNTER — Encounter (HOSPITAL_COMMUNITY): Payer: Self-pay

## 2017-11-13 DIAGNOSIS — R262 Difficulty in walking, not elsewhere classified: Secondary | ICD-10-CM | POA: Diagnosis present

## 2017-11-13 DIAGNOSIS — G8929 Other chronic pain: Secondary | ICD-10-CM | POA: Diagnosis present

## 2017-11-13 DIAGNOSIS — M545 Low back pain: Secondary | ICD-10-CM | POA: Insufficient documentation

## 2017-11-13 NOTE — Patient Instructions (Signed)
  Setup  Begin standing upright, holding both ends of a resistance band that is anchored in front of you at chest height, with your palms facing inward. Movement  Pull your arms back with your elbows tucked at your sides, then return to the starting position and repeat. Tip  Make sure to keep your core engaged and focus on squeezing your shoulder blades together as you pull on the band.  Perform 1x/day, 2-3 sets of 20-30 reps with blue band    Setup  Begin in a standing position holding both ends of a resistance band anchored in front of you with your arms straight in front of your body. Movement  Keeping your elbows straight, pull your hands down toward your hips. You should feel your shoulder blades go down. Return to start and repeat.  Tip  Make sure to maintain good posture during the exercise and do not shrug your shoulders.   Perform 1x/day, 2-3 sets of 20-30 reps with blue band
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
## 2017-11-15 ENCOUNTER — Ambulatory Visit (HOSPITAL_COMMUNITY): Payer: Medicare Other

## 2017-11-15 ENCOUNTER — Telehealth (HOSPITAL_COMMUNITY): Payer: Self-pay

## 2017-11-15 NOTE — Telephone Encounter (Signed)
No show #1; pt called pt re no show and he stated that he wanted to cancel today as he had an emergency. He is not scheduled for another appointment but would like to make one for Tuesday next week if possible. PT notified front office.   Geraldine Solar PT, DPT

## 2017-11-16 ENCOUNTER — Telehealth (HOSPITAL_COMMUNITY): Payer: Self-pay | Admitting: Internal Medicine

## 2017-11-16 NOTE — Telephone Encounter (Signed)
11/16/17  Left him a message asking if he could come in on Wed., April 10 at 2:30 with [facility]... This time is on hold on her schedule

## 2018-01-02 IMAGING — CR DG KNEE 1-2V PORT*L*
2 series · 2 of 2 positions shown · non-contrast
Comparison: [DATE]

CLINICAL DATA: Post total left hip arthroplasty.

EXAM:
PORTABLE LEFT KNEE - 1-2 VIEW

[ap]
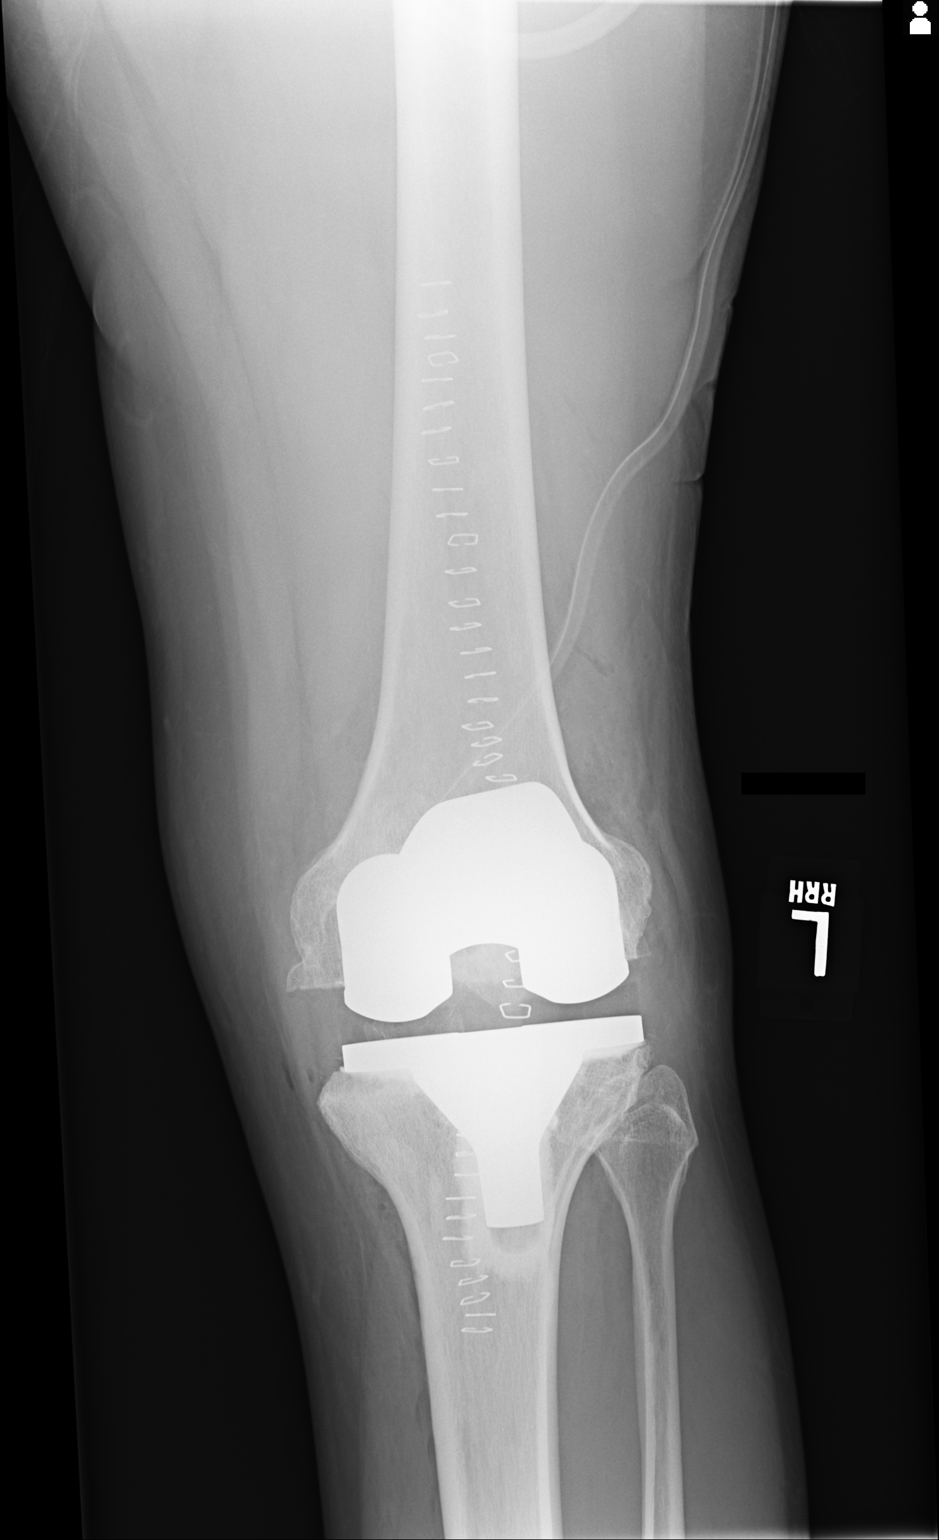

[lat]
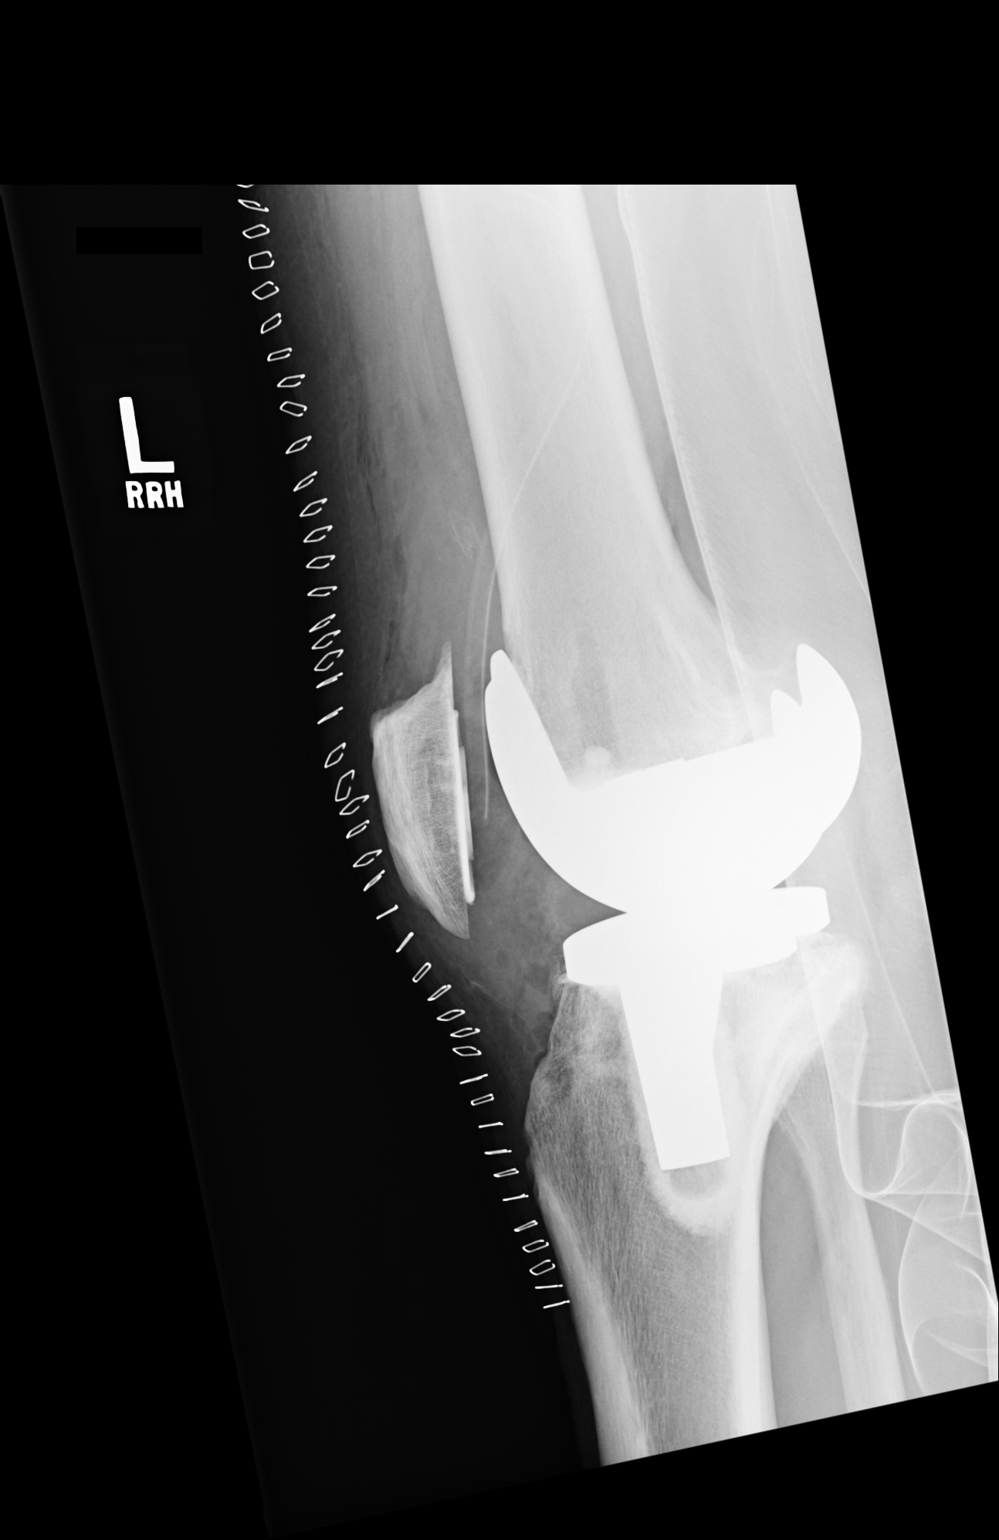

[2 of 2 positions shown; findings below may reference images not displayed]

FINDINGS: There has been a 3 component total left hip arthroplasty with
anatomic alignment of the orthopedic hardware. No evidence of
fracture. Surgical drain is seen within the suprapatellar joint
space. Skin staples in place. Expected postsurgical edema and soft
tissue emphysema seen.
IMPRESSION: Status post total left hip arthroplasty without evidence of
immediate complications.

## 2018-01-15 ENCOUNTER — Other Ambulatory Visit: Payer: Self-pay

## 2018-01-15 ENCOUNTER — Encounter (HOSPITAL_COMMUNITY): Payer: Self-pay

## 2018-01-15 ENCOUNTER — Emergency Department (HOSPITAL_COMMUNITY)
Admission: EM | Admit: 2018-01-15 | Discharge: 2018-01-15 | Disposition: A | Discharge status: Home / Self Care | Payer: Medicare Other | Attending: Emergency Medicine | Admitting: Emergency Medicine

## 2018-01-15 DIAGNOSIS — M25571 Pain in right ankle and joints of right foot: Secondary | ICD-10-CM | POA: Diagnosis present

## 2018-01-15 DIAGNOSIS — Z79899 Other long term (current) drug therapy: Secondary | ICD-10-CM | POA: Insufficient documentation

## 2018-01-15 DIAGNOSIS — I10 Essential (primary) hypertension: Secondary | ICD-10-CM | POA: Insufficient documentation

## 2018-01-15 DIAGNOSIS — Z96652 Presence of left artificial knee joint: Secondary | ICD-10-CM | POA: Insufficient documentation

## 2018-01-15 DIAGNOSIS — M109 Gout, unspecified: Secondary | ICD-10-CM

## 2018-01-15 DIAGNOSIS — M10071 Idiopathic gout, right ankle and foot: Secondary | ICD-10-CM | POA: Insufficient documentation

## 2018-01-15 MED ORDER — COLCHICINE 0.6 MG PO TABS
0.6000 mg | ORAL_TABLET | Freq: Once | ORAL | Status: AC
  Administered 2018-01-15: 0.6 mg via ORAL
  Filled 2018-01-15: qty 1

## 2018-01-15 MED ORDER — HYDROCODONE-ACETAMINOPHEN 5-325 MG PO TABS: ORAL_TABLET | ORAL | 0 refills | Status: DC

## 2018-01-15 MED ORDER — DEXAMETHASONE SODIUM PHOSPHATE 4 MG/ML IJ SOLN
10.0000 mg | Freq: Once | INTRAMUSCULAR | Status: AC
  Administered 2018-01-15: 10 mg via INTRAMUSCULAR
  Filled 2018-01-15: qty 3

## 2018-01-15 MED ORDER — COLCHICINE 0.6 MG PO TABS: 0.6000 mg | ORAL_TABLET | Freq: Two times a day (BID) | ORAL | 0 refills | Status: DC

## 2018-01-15 MED ORDER — HYDROCODONE-ACETAMINOPHEN 5-325 MG PO TABS
1.0000 | ORAL_TABLET | Freq: Once | ORAL | Status: AC
  Administered 2018-01-15: 1 via ORAL
  Filled 2018-01-15: qty 1

## 2018-01-15 NOTE — Discharge Instructions (Signed)
Medication as directed.  Follow-up with your primary doctor for recheck.  Try to maintain a low purine diet.
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
## 2018-01-15 NOTE — ED Triage Notes (Signed)
Pt is complaining of right ankle pain and states it is a gout flare up. States this happens about twice a year. Ran out of gout pills about 6 months ago.

## 2018-02-04 ENCOUNTER — Emergency Department (HOSPITAL_COMMUNITY)
Admission: EM | Admit: 2018-02-04 | Discharge: 2018-02-04 | Disposition: A | Discharge status: Home / Self Care | Payer: Medicare Other | Attending: Emergency Medicine | Admitting: Emergency Medicine

## 2018-02-04 ENCOUNTER — Encounter (HOSPITAL_COMMUNITY): Payer: Self-pay | Admitting: *Deleted

## 2018-02-04 ENCOUNTER — Other Ambulatory Visit: Payer: Self-pay

## 2018-02-04 DIAGNOSIS — I10 Essential (primary) hypertension: Secondary | ICD-10-CM | POA: Insufficient documentation

## 2018-02-04 DIAGNOSIS — Z79899 Other long term (current) drug therapy: Secondary | ICD-10-CM | POA: Insufficient documentation

## 2018-02-04 DIAGNOSIS — M25571 Pain in right ankle and joints of right foot: Secondary | ICD-10-CM | POA: Diagnosis present

## 2018-02-04 DIAGNOSIS — M109 Gout, unspecified: Secondary | ICD-10-CM | POA: Insufficient documentation

## 2018-02-04 MED ORDER — PREDNISONE 20 MG PO TABS: 40.0000 mg | ORAL_TABLET | Freq: Every day | ORAL | 0 refills | Status: DC

## 2018-02-04 MED ORDER — HYDROCODONE-ACETAMINOPHEN 5-325 MG PO TABS: ORAL_TABLET | ORAL | 0 refills | Status: DC

## 2018-02-04 MED ORDER — DEXAMETHASONE SODIUM PHOSPHATE 4 MG/ML IJ SOLN
10.0000 mg | Freq: Once | INTRAMUSCULAR | Status: AC
  Administered 2018-02-04: 10 mg via INTRAMUSCULAR
  Filled 2018-02-04: qty 3

## 2018-02-04 MED ORDER — HYDROCODONE-ACETAMINOPHEN 5-325 MG PO TABS
1.0000 | ORAL_TABLET | Freq: Once | ORAL | Status: AC
  Administered 2018-02-04: 1 via ORAL
  Filled 2018-02-04: qty 1

## 2018-02-04 NOTE — ED Triage Notes (Signed)
Pt c/o right ankle pain from a gout flare up, denies any injury,

## 2018-02-04 NOTE — Discharge Instructions (Signed)
Take the prescriptions as directed.  Call your regular medical doctor today to schedule a follow up appointment within the next 3 days.  Return to the Emergency Department immediately sooner if worsening.
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
## 2018-02-05 ENCOUNTER — Telehealth: Payer: Self-pay

## 2018-02-05 ENCOUNTER — Other Ambulatory Visit: Payer: Self-pay

## 2018-02-05 DIAGNOSIS — B182 Chronic viral hepatitis C: Secondary | ICD-10-CM

## 2018-02-05 NOTE — Telephone Encounter (Signed)
I called pt and left Vm for him to go to the lab tomorrow for his blood work.

## 2018-02-05 NOTE — Telephone Encounter (Signed)
PT called and is aware to go to the lab tomorrow.

## 2018-02-08 LAB — HEPATITIS C RNA QUANTITATIVE
HCV Quantitative Log: <1.18 Log IU/mL
HCV RNA, PCR, QN: NOT DETECTED [IU]/mL

## 2018-02-12 ENCOUNTER — Encounter: Payer: Self-pay | Admitting: Gastroenterology

## 2018-02-12 ENCOUNTER — Telehealth: Payer: Self-pay | Admitting: Gastroenterology

## 2018-02-12 NOTE — Telephone Encounter (Signed)
Pt is aware.  

## 2018-02-12 NOTE — Telephone Encounter (Signed)
PLEASE CALL PT. HIS HEP C IS CURED. NEXT OPV IN AUG 2019.

## 2018-02-12 NOTE — Telephone Encounter (Signed)
PATIENT SCHEDULED AND LETTER SENT  °

## 2018-03-21 ENCOUNTER — Ambulatory Visit: Payer: Medicare Other | Admitting: Gastroenterology

## 2018-04-25 ENCOUNTER — Encounter (HOSPITAL_COMMUNITY): Payer: Self-pay

## 2018-04-25 NOTE — Therapy (Signed)
[place], [place], [address] Phone: [phone]   Fax:  [phone]  Patient Details  Name: Tyler Hart MRN: [id number] Date of Birth: 10/25/1960 Referring Provider:  No ref. provider found  Encounter Date: 04/25/2018  PHYSICAL THERAPY DISCHARGE SUMMARY  Visits from Start of Care: 13  Current functional level related to goals / functional outcomes: See last note   Remaining deficits: See last note   Education / Equipment: n/a  Plan: Patient agrees to discharge.  Patient goals were not met. Patient is being discharged due to not returning since the last visit.  ?????      Geraldine Solar PT, [place] [address] [place], [place], [address] Phone: [phone]   Fax:  [phone]

## 2018-05-08 ENCOUNTER — Other Ambulatory Visit: Payer: Self-pay | Admitting: Orthopedic Surgery

## 2018-05-08 ENCOUNTER — Ambulatory Visit (INDEPENDENT_AMBULATORY_CARE_PROVIDER_SITE_OTHER): Payer: Medicare Other | Admitting: Orthopedic Surgery

## 2018-05-08 VITALS — BP 112/76 | HR 73 | Ht 74.0 in | Wt 249.0 lb

## 2018-05-08 DIAGNOSIS — M25461 Effusion, right knee: Secondary | ICD-10-CM
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
## 2018-05-09 ENCOUNTER — Telehealth: Payer: Self-pay | Admitting: Radiology

## 2018-05-09 LAB — SYNOVIAL CELL COUNT + DIFF, W/ CRYSTALS
BASOPHILS, %: 0 %
Eosinophils-Synovial: 0 % (ref 0–2)
LYMPHOCYTES-SYNOVIAL FLD: 22 % (ref 0–74)
Monocyte/Macrophage: 18 % (ref 0–69)
NEUTROPHIL, SYNOVIAL: 60 % — AB (ref 0–24)
SYNOVIOCYTES, %: 0 % (ref 0–15)
WBC, Synovial: 7733 cells/uL — ABNORMAL HIGH (ref <150)

## 2018-05-09 NOTE — Telephone Encounter (Signed)
He has uric acid crystals in synovial fluid

## 2018-05-10 ENCOUNTER — Other Ambulatory Visit: Payer: Self-pay | Admitting: Orthopedic Surgery

## 2018-05-10 DIAGNOSIS — M10061 Idiopathic gout, right knee: Secondary | ICD-10-CM

## 2018-05-10 MED ORDER — COLCHICINE 0.6 MG PO TABS: 0.6000 mg | ORAL_TABLET | Freq: Two times a day (BID) | ORAL | 0 refills | Status: DC

## 2018-05-13 ENCOUNTER — Ambulatory Visit (INDEPENDENT_AMBULATORY_CARE_PROVIDER_SITE_OTHER): Payer: Medicare Other

## 2018-05-13 ENCOUNTER — Ambulatory Visit (INDEPENDENT_AMBULATORY_CARE_PROVIDER_SITE_OTHER): Payer: Medicare Other | Admitting: Orthopedic Surgery

## 2018-05-13 ENCOUNTER — Encounter: Payer: Self-pay | Admitting: Orthopedic Surgery

## 2018-05-13 VITALS — BP 109/74 | HR 80 | Ht 74.0 in | Wt 249.0 lb

## 2018-05-13 DIAGNOSIS — M25561 Pain in right knee: Secondary | ICD-10-CM | POA: Diagnosis not present

## 2018-05-13 DIAGNOSIS — M10362 Gout due to renal impairment, left knee: Secondary | ICD-10-CM

## 2018-05-13 NOTE — Progress Notes (Signed)
Chief Complaint  Patient presents with  . Knee Pain    right knee feels better, but still has pain     Status post aspiration right knee  Patient's lab studies came back as gout  He is on one colchicine a day  Recommend 3 a day for a week and then follow-up in a week  White cells 7700 neutrophils 60% lymphocytes 22% intracellular uric acid crystals noted.  Encounter Diagnoses  Name Primary?  . Right knee pain, unspecified chronicity Yes  . Acute gout due to renal impairment involving left knee

## 2018-05-13 NOTE — Patient Instructions (Signed)
Colchicine take 3 tablets daily x1 week

## 2018-05-14 LAB — ANAEROBIC AND AEROBIC CULTURE
AER RESULT:: NO GROWTH
MICRO NUMBER:: 91154518
MICRO NUMBER:: 91154519
SPECIMEN QUALITY:: ADEQUATE
SPECIMEN QUALITY:: ADEQUATE

## 2018-05-20 ENCOUNTER — Ambulatory Visit (INDEPENDENT_AMBULATORY_CARE_PROVIDER_SITE_OTHER): Payer: Medicare Other | Admitting: Orthopedic Surgery

## 2018-05-20 VITALS — BP 121/86 | HR 88 | Ht 74.0 in | Wt 249.0 lb

## 2018-05-20 DIAGNOSIS — M10061 Idiopathic gout, right knee: Secondary | ICD-10-CM | POA: Diagnosis not present

## 2018-05-20 NOTE — Progress Notes (Signed)
Chief Complaint  Patient presents with  . Follow-up    Recheck on right knee.    57 year old male had an acute gout attack with him on colchicine aspirated his knee sent it for aspiration culture and analysis came back gout crystals  Knee looks better feels good wants to have surgery in January  Come back in December for preop with x-ray for right total knee

## 2018-05-30 ENCOUNTER — Encounter: Payer: Self-pay | Admitting: Gastroenterology

## 2018-05-30 ENCOUNTER — Ambulatory Visit (INDEPENDENT_AMBULATORY_CARE_PROVIDER_SITE_OTHER): Payer: Medicare Other | Admitting: Gastroenterology

## 2018-05-30 DIAGNOSIS — D126 Benign neoplasm of colon, unspecified: Secondary | ICD-10-CM | POA: Diagnosis not present

## 2018-05-30 DIAGNOSIS — B182 Chronic viral hepatitis C: Secondary | ICD-10-CM | POA: Diagnosis not present

## 2018-05-30 DIAGNOSIS — B9681 Helicobacter pylori [H. pylori] as the cause of diseases classified elsewhere: Secondary | ICD-10-CM | POA: Diagnosis not present

## 2018-05-30 DIAGNOSIS — K2970 Gastritis, unspecified, without bleeding: Secondary | ICD-10-CM | POA: Diagnosis not present

## 2018-05-30 NOTE — Patient Instructions (Addendum)
PLEASE COMPLETE H PYLORI BREATH TEST WITHIN 7 DAYS.  DRINK WATER TO KEEP YOUR URINE LIGHT YELLOW.  FOLLOW A HIGH FIBER DIET. AVOID ITEMS THAT CAUSE BLOATING & GAS. SEE INFO BELOW.  PLEASE CALL WITH QUESTIONS OR CONCERNS.  FOLLOW UP IN 1 YEAR.   REPEAT COLONOSCOPY IN APR 2023.

## 2018-05-30 NOTE — Assessment & Plan Note (Signed)
COMPLETED TREATMENT IN 2018.  NEEDS BREATH TEST TO CONFIRM ERADICATION.  RESULTS WILL BE BACK IN 5 BUSINESS DAYS AFTER TEST COMPLETED.

## 2018-05-30 NOTE — Progress Notes (Signed)
ON RECALL  °

## 2018-05-30 NOTE — Assessment & Plan Note (Signed)
TREATED 2018. SVR 2019.  I PERSONALLY REVIEWED THE FEB 2019 ultrasound WITH DR. Thornton Papas TODAY: SMOOTH CONTOURS/NO CIRHHOSIS. DRINK WATER TO KEEP YOUR URINE LIGHT YELLOW. FOLLOW A HIGH FIBER DIET. AVOID ITEMS THAT CAUSE BLOATING & GAS.  HANDOUT GIVEN. PLEASE CALL WITH QUESTIONS OR CONCERNS. FOLLOW UP IN 1 YEAR.

## 2018-05-30 NOTE — Assessment & Plan Note (Addendum)
TCS UP TO DATE-2 SIMPLE ADENOMA REMOVED BUT ATE FULL MEAL ON DAY BEFORE TCS. NO BRBPR OR MELENA.   NEXT TCS APR 2023.
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
## 2018-06-20 ENCOUNTER — Emergency Department (HOSPITAL_COMMUNITY): Payer: Medicare Other

## 2018-06-20 ENCOUNTER — Other Ambulatory Visit: Payer: Self-pay

## 2018-06-20 ENCOUNTER — Emergency Department (HOSPITAL_COMMUNITY)
Admission: EM | Admit: 2018-06-20 | Discharge: 2018-06-20 | Disposition: A | Discharge status: Home / Self Care | Payer: Medicare Other | Attending: Emergency Medicine | Admitting: Emergency Medicine

## 2018-06-20 ENCOUNTER — Encounter (HOSPITAL_COMMUNITY): Payer: Self-pay | Admitting: *Deleted

## 2018-06-20 DIAGNOSIS — Z79899 Other long term (current) drug therapy: Secondary | ICD-10-CM | POA: Diagnosis not present

## 2018-06-20 DIAGNOSIS — M25561 Pain in right knee: Secondary | ICD-10-CM | POA: Diagnosis present

## 2018-06-20 DIAGNOSIS — R600 Localized edema: Secondary | ICD-10-CM | POA: Diagnosis not present

## 2018-06-20 DIAGNOSIS — I10 Essential (primary) hypertension: Secondary | ICD-10-CM | POA: Diagnosis not present

## 2018-06-20 DIAGNOSIS — Z96652 Presence of left artificial knee joint: Secondary | ICD-10-CM | POA: Diagnosis not present

## 2018-06-20 DIAGNOSIS — M109 Gout, unspecified: Secondary | ICD-10-CM | POA: Insufficient documentation

## 2018-06-20 LAB — BASIC METABOLIC PANEL
ANION GAP: 9 (ref 5–15)
BUN: 8 mg/dL (ref 6–20)
CO2: 23 mmol/L (ref 22–32)
Calcium: 9.1 mg/dL (ref 8.9–10.3)
Chloride: 103 mmol/L (ref 98–111)
Creatinine, Ser: 0.98 mg/dL (ref 0.61–1.24)
GFR calc Af Amer: >60 mL/min (ref >60)
GFR calc non Af Amer: >60 mL/min (ref >60)
GLUCOSE: 107 mg/dL — AB (ref 70–99)
Potassium: 3.7 mmol/L (ref 3.5–5.1)
Sodium: 135 mmol/L (ref 135–145)

## 2018-06-20 LAB — CBC WITH DIFFERENTIAL/PLATELET
Abs Immature Granulocytes: 0.02 10*3/uL (ref 0.00–0.07)
Basophils Absolute: 0.1 10*3/uL (ref 0.0–0.1)
Basophils Relative: 1 %
EOS ABS: 0 10*3/uL (ref 0.0–0.5)
Eosinophils Relative: 0 %
HEMATOCRIT: 52.1 % — AB (ref 39.0–52.0)
Hemoglobin: 17 g/dL (ref 13.0–17.0)
IMMATURE GRANULOCYTES: 0 %
LYMPHS ABS: 1.6 10*3/uL (ref 0.7–4.0)
Lymphocytes Relative: 21 %
MCH: 31.4 pg (ref 26.0–34.0)
MCHC: 32.6 g/dL (ref 30.0–36.0)
MCV: 96.3 fL (ref 80.0–100.0)
MONO ABS: 1 10*3/uL (ref 0.1–1.0)
MONOS PCT: 12 %
NEUTROS PCT: 66 %
Neutro Abs: 5.3 10*3/uL (ref 1.7–7.7)
Platelets: 216 10*3/uL (ref 150–400)
RBC: 5.41 MIL/uL (ref 4.22–5.81)
RDW: 13.1 % (ref 11.5–15.5)
WBC: 8 10*3/uL (ref 4.0–10.5)
nRBC: 0 % (ref 0.0–0.2)

## 2018-06-20 LAB — URIC ACID: Uric Acid, Serum: 9.4 mg/dL — ABNORMAL HIGH (ref 3.7–8.6)

## 2018-06-20 IMAGING — DX DG KNEE COMPLETE 4+V*R*
4 series · 4 of 4 positions shown · non-contrast
Comparison: [DATE]

CLINICAL DATA: RIGHT knee pain since last night, swelling, no known
injury, history gout

EXAM:
RIGHT KNEE - COMPLETE 4+ VIEW

[knee ap]
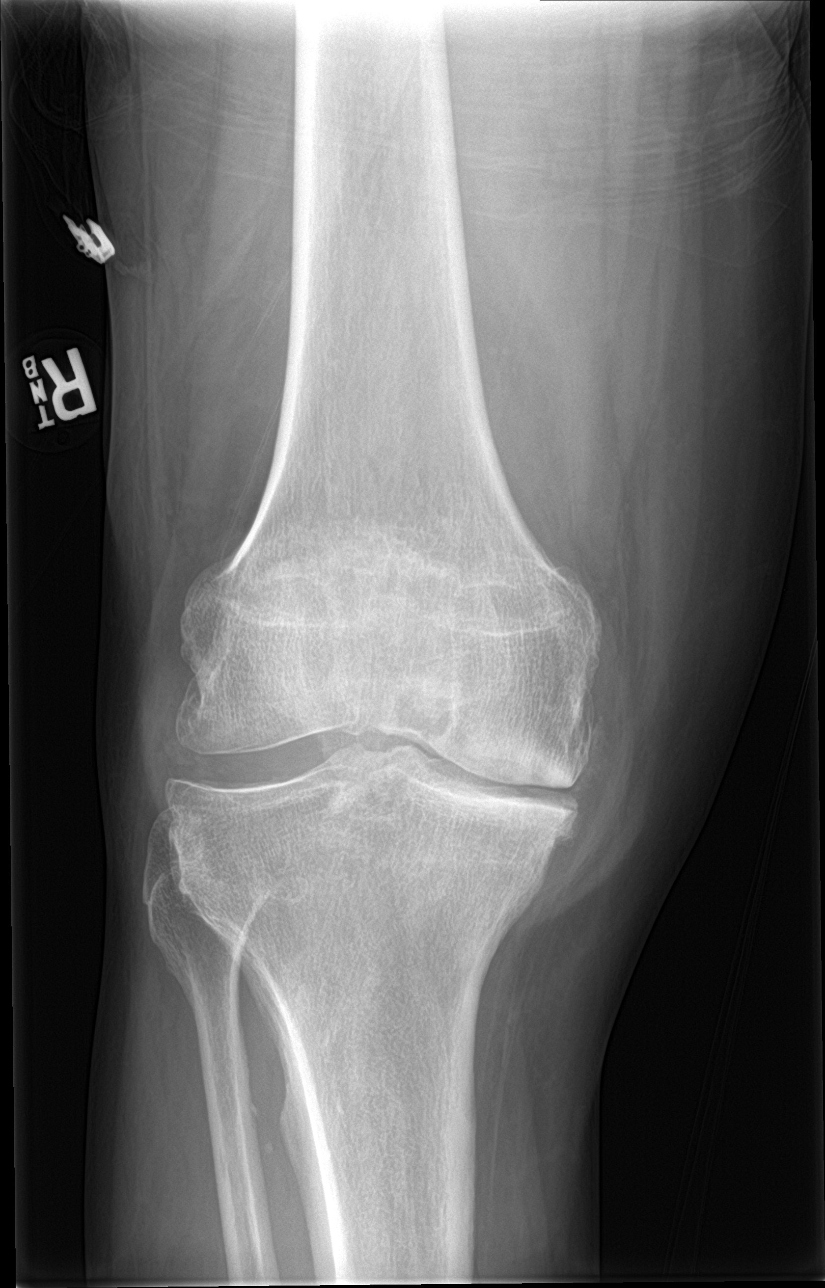

[tunnel]
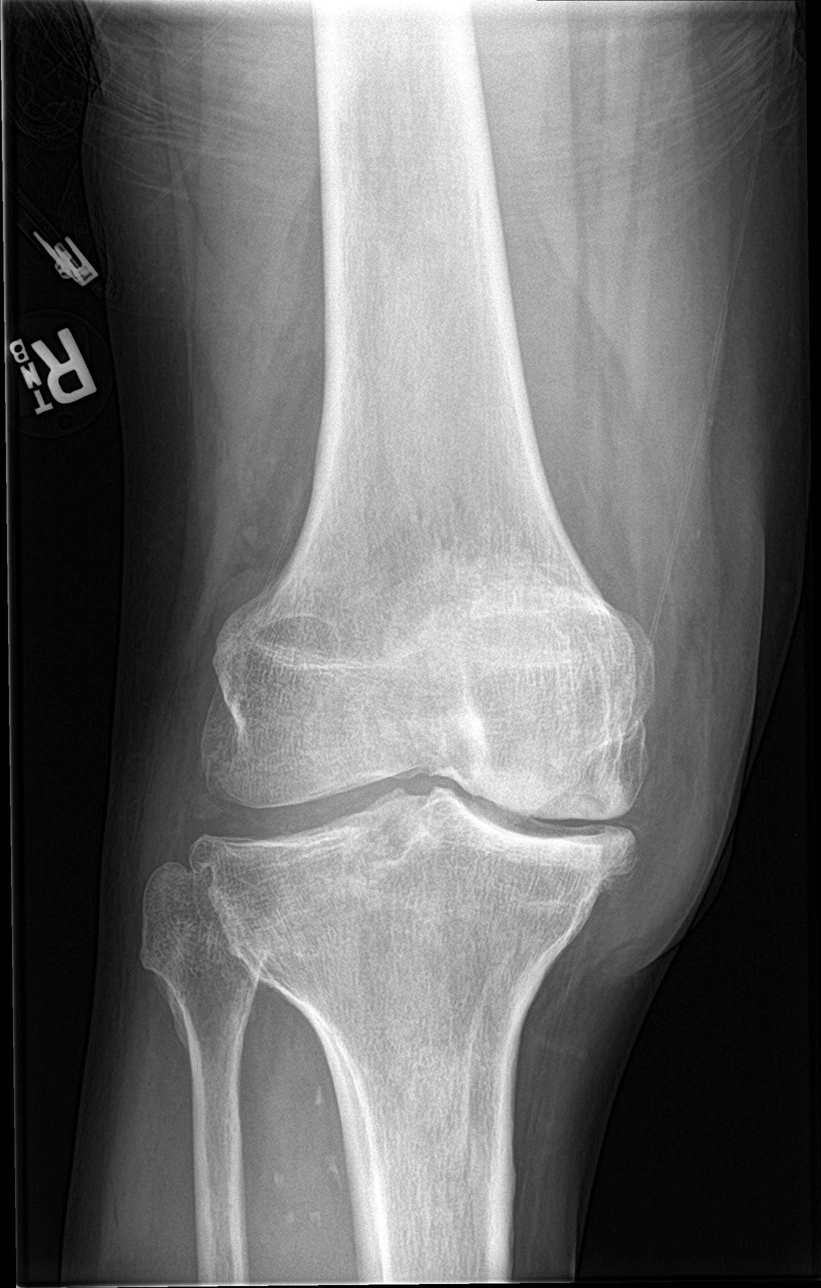

[knee lat]
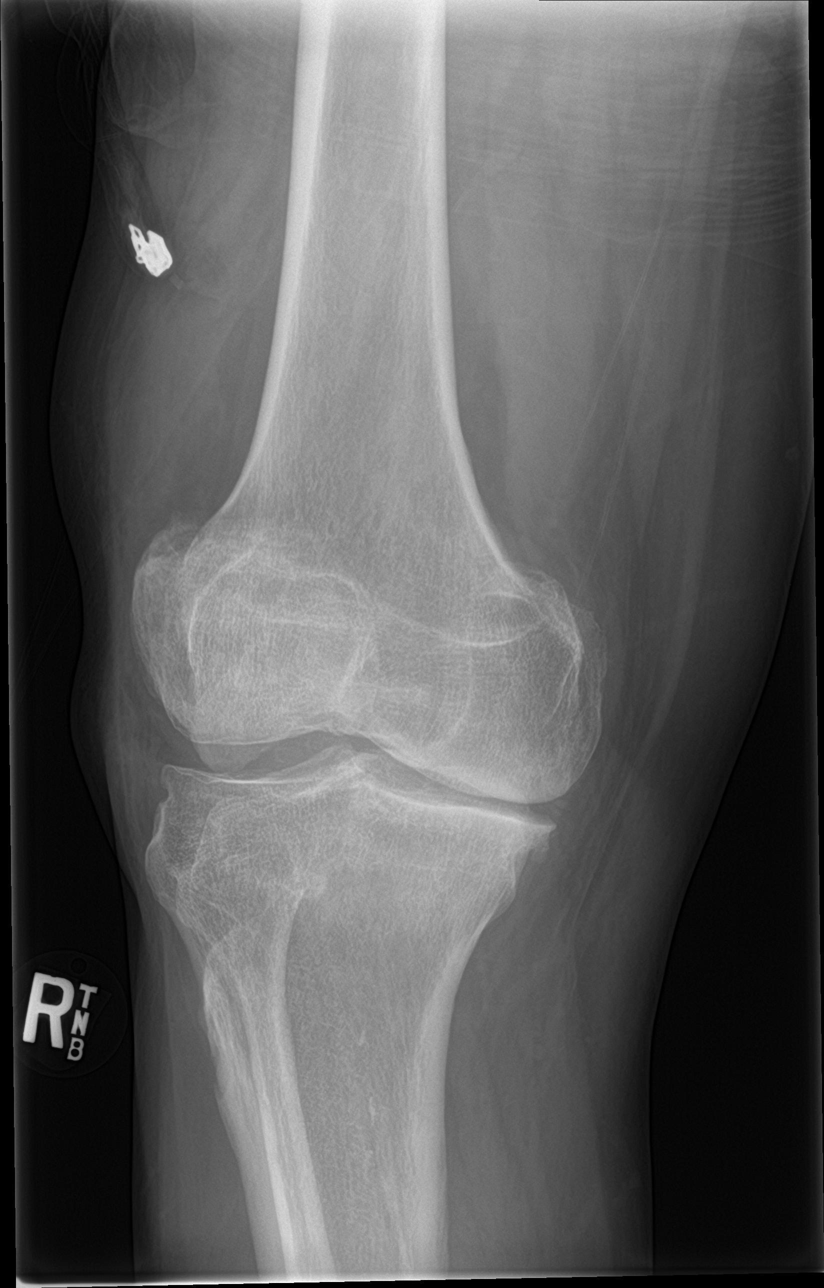

[knee sunrise]
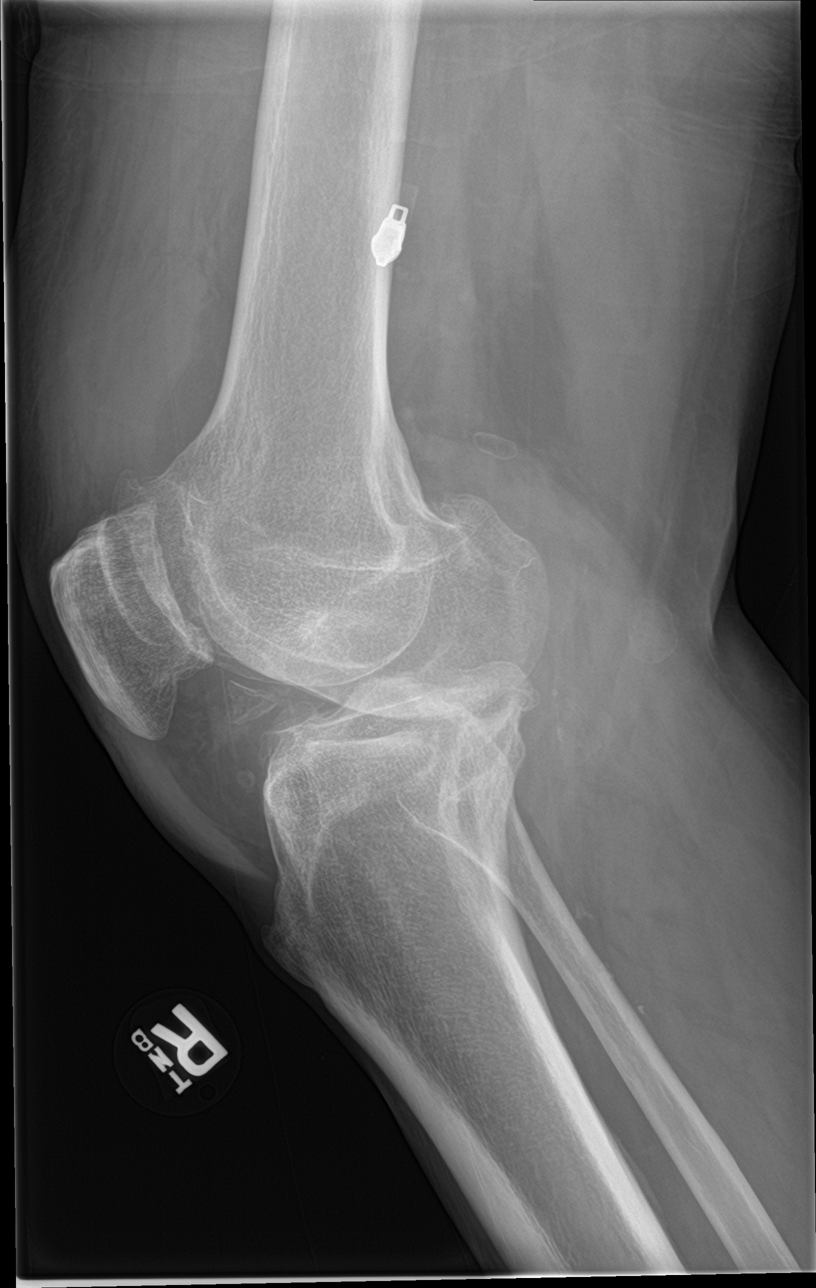

[4 of 4 positions shown; findings below may reference images not displayed]

FINDINGS: Low normal osseous mineralization.

Medial compartment joint space narrowing, subchondral sclerosis and
marginal spur formation.

Minimal chondrocalcinosis.

Mild patellofemoral degenerative changes as well.

No acute fracture, dislocation, or bone destruction/erosions.

Probable joint effusion.

Rounded soft tissue calcification posterior to the RIGHT knee is
nonspecific, could represent a soft tissue calcification or be
related to a calcified body within a large Baker cyst, unchanged
since [DATE].
IMPRESSION: Degenerative changes of the RIGHT knee with chondrocalcinosis
question CPPD.

No acute bony abnormalities.

## 2018-06-20 MED ORDER — OXYCODONE-ACETAMINOPHEN 5-325 MG PO TABS
2.0000 | ORAL_TABLET | Freq: Once | ORAL | Status: AC
  Administered 2018-06-20: 2 via ORAL
  Filled 2018-06-20: qty 2

## 2018-06-20 MED ORDER — DEXAMETHASONE SODIUM PHOSPHATE 4 MG/ML IJ SOLN
10.0000 mg | Freq: Once | INTRAMUSCULAR | Status: AC
  Administered 2018-06-20: 10 mg via INTRAMUSCULAR
  Filled 2018-06-20: qty 3

## 2018-06-20 MED ORDER — PREDNISONE 10 MG PO TABS: ORAL_TABLET | ORAL | 0 refills | Status: DC

## 2018-06-20 MED ORDER — HYDROCODONE-ACETAMINOPHEN 7.5-325 MG PO TABS: 1.0000 | ORAL_TABLET | Freq: Four times a day (QID) | ORAL | 0 refills | Status: DC | PRN

## 2018-06-20 NOTE — ED Notes (Signed)
Patient transported to X-ray 

## 2018-06-20 NOTE — ED Triage Notes (Signed)
Pt c/o right knee pain that started last night; pt states he thinks it is a gout flare up

## 2018-06-20 NOTE — Discharge Instructions (Addendum)
Follow-up with your regular doctor if needed.

## 2018-06-21 ENCOUNTER — Ambulatory Visit (INDEPENDENT_AMBULATORY_CARE_PROVIDER_SITE_OTHER): Payer: Medicare Other | Admitting: Orthopedic Surgery

## 2018-06-21 ENCOUNTER — Encounter: Payer: Self-pay | Admitting: Orthopedic Surgery

## 2018-06-21 VITALS — BP 127/79 | HR 89 | Ht 74.0 in | Wt 251.0 lb

## 2018-06-21 DIAGNOSIS — M10061 Idiopathic gout, right knee: Secondary | ICD-10-CM

## 2018-06-21 DIAGNOSIS — M25461 Effusion, right knee: Secondary | ICD-10-CM
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
## 2018-06-28 ENCOUNTER — Ambulatory Visit: Payer: Medicare Other | Admitting: Orthopedic Surgery

## 2018-06-28 ENCOUNTER — Encounter: Payer: Self-pay | Admitting: Orthopedic Surgery

## 2018-07-29 ENCOUNTER — Ambulatory Visit: Payer: Medicare Other | Admitting: Orthopedic Surgery

## 2018-08-23 ENCOUNTER — Encounter: Payer: Self-pay | Admitting: Orthopedic Surgery

## 2018-08-23 ENCOUNTER — Ambulatory Visit (INDEPENDENT_AMBULATORY_CARE_PROVIDER_SITE_OTHER): Payer: Medicare Other | Admitting: Orthopedic Surgery

## 2018-08-23 VITALS — BP 125/89 | HR 81 | Ht 74.0 in | Wt 245.0 lb

## 2018-08-23 DIAGNOSIS — M10061 Idiopathic gout, right knee: Secondary | ICD-10-CM | POA: Diagnosis not present

## 2018-08-23 DIAGNOSIS — M25461 Effusion, right knee: Secondary | ICD-10-CM | POA: Diagnosis not present

## 2018-08-23 NOTE — Progress Notes (Signed)
Progress Note   Patient ID: Tyler Hart, male   DOB: 1960-12-22, 58 y.o.   MRN: [id number]   Chief Complaint  Patient presents with  . Knee Pain    right knee pain and swelling    58 year old male contemplating right total knee had a gout attack since that time is had intermittent effusions in the right knee with return of fluid over the last 2 weeks and more pain    Review of Systems  Constitutional: Negative for fever.     No Known Allergies   BP 125/89   Pulse 81   Ht 6\' 2"  (1.88 m)   Wt 245 lb (111.1 kg)   BMI 31.46 kg/m   Physical Exam Vitals signs reviewed.  Constitutional:      Appearance: He is well-developed.     Comments: Vital signs have been reviewed and are stable. Gen. appearance the patient is well-developed and well-nourished with normal grooming and hygiene.   Musculoskeletal:       Legs:  Skin:    General: Skin is warm and dry.     Findings: No erythema.  Neurological:     Mental Status: He is alert and oriented to person, place, and time.     Gait: Gait abnormal.      Medical decisions:   Data  Imaging:   Previous x-rays were obtained he has osteoarthritis grade 4  Encounter Diagnoses  Name Primary?  . Acute idiopathic gout of right knee Yes  . Effusion of right knee joint     PLAN:   Aspiration injection right knee follow-up as needed  Procedure note injection and aspiration right knee joint  Verbal consent was obtained to aspirate and inject the right knee joint   Timeout was completed to confirm the site of aspiration and injection  An 18-gauge needle was used to aspirate the knee joint from a suprapatellar lateral approach.  The medications used were 40 mg of Depo-Medrol and 1% lidocaine 3 cc  Anesthesia was provided by ethyl chloride and the skin was prepped with alcohol.  After cleaning the skin with alcohol an 18-gauge needle was used to aspirate the right knee joint.  We obtained 40  cc of fluid cloudy  fluid noninfectious  We follow this by injection of 40 mg of Depo-Medrol and 3 cc 1% lidocaine.  There were no complications. A sterile bandage was applied.   Encounter Diagnoses  Name Primary?  . Acute idiopathic gout of right knee Yes  . Effusion of right knee joint      Arther Abbott, MD 08/23/2018 10:13 AM

## 2018-09-26 ENCOUNTER — Other Ambulatory Visit: Payer: Self-pay

## 2018-09-26 ENCOUNTER — Emergency Department (HOSPITAL_COMMUNITY)
Admission: EM | Admit: 2018-09-26 | Discharge: 2018-09-26 | Disposition: A | Discharge status: Home / Self Care | Payer: Medicare Other | Attending: Emergency Medicine | Admitting: Emergency Medicine

## 2018-09-26 ENCOUNTER — Encounter (HOSPITAL_COMMUNITY): Payer: Self-pay | Admitting: Emergency Medicine

## 2018-09-26 DIAGNOSIS — I10 Essential (primary) hypertension: Secondary | ICD-10-CM | POA: Diagnosis not present

## 2018-09-26 DIAGNOSIS — Z96652 Presence of left artificial knee joint: Secondary | ICD-10-CM | POA: Insufficient documentation

## 2018-09-26 DIAGNOSIS — Z79899 Other long term (current) drug therapy: Secondary | ICD-10-CM | POA: Diagnosis not present

## 2018-09-26 DIAGNOSIS — M109 Gout, unspecified: Secondary | ICD-10-CM | POA: Insufficient documentation

## 2018-09-26 DIAGNOSIS — M25522 Pain in left elbow: Secondary | ICD-10-CM | POA: Diagnosis present

## 2018-09-26 MED ORDER — DEXAMETHASONE SODIUM PHOSPHATE 4 MG/ML IJ SOLN
10.0000 mg | Freq: Once | INTRAMUSCULAR | Status: AC
  Administered 2018-09-26: 10 mg via INTRAMUSCULAR
  Filled 2018-09-26: qty 3

## 2018-09-26 MED ORDER — COLCHICINE 0.6 MG PO TABS: 0.6000 mg | ORAL_TABLET | Freq: Two times a day (BID) | ORAL | 0 refills | Status: DC

## 2018-09-26 NOTE — ED Triage Notes (Signed)
Pt c/o gout to left elbow since last night
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
## 2018-09-26 NOTE — Discharge Instructions (Addendum)
Take the colchicine as directed for your gout flare.  Try to take your allopurinol daily to prevent future gout flares.  Try to avoid excessive alcohol shrimp and red meat.

## 2018-10-20 ENCOUNTER — Emergency Department (HOSPITAL_COMMUNITY)
Admission: EM | Admit: 2018-10-20 | Discharge: 2018-10-20 | Disposition: A | Discharge status: Home / Self Care | Payer: Medicare Other | Attending: Emergency Medicine | Admitting: Emergency Medicine

## 2018-10-20 ENCOUNTER — Encounter (HOSPITAL_COMMUNITY): Payer: Self-pay | Admitting: Emergency Medicine

## 2018-10-20 ENCOUNTER — Other Ambulatory Visit: Payer: Self-pay

## 2018-10-20 DIAGNOSIS — M109 Gout, unspecified: Secondary | ICD-10-CM | POA: Diagnosis not present

## 2018-10-20 DIAGNOSIS — Z79899 Other long term (current) drug therapy: Secondary | ICD-10-CM | POA: Diagnosis not present

## 2018-10-20 DIAGNOSIS — I10 Essential (primary) hypertension: Secondary | ICD-10-CM | POA: Diagnosis not present

## 2018-10-20 DIAGNOSIS — M25561 Pain in right knee: Secondary | ICD-10-CM | POA: Diagnosis present

## 2018-10-20 DIAGNOSIS — Z96652 Presence of left artificial knee joint: Secondary | ICD-10-CM | POA: Diagnosis not present

## 2018-10-20 HISTORY — DX: Pain, unspecified: R52

## 2018-10-20 MED ORDER — DEXAMETHASONE SODIUM PHOSPHATE 10 MG/ML IJ SOLN
10.0000 mg | Freq: Once | INTRAMUSCULAR | Status: AC
  Administered 2018-10-20: 10 mg via INTRAMUSCULAR
  Filled 2018-10-20: qty 1
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
## 2018-10-20 NOTE — ED Triage Notes (Signed)
Patient c/o right knee and right ankle pain with some swelling that started last night. Denies any known injury. Per patient pain is caused by gout. Patient takes colchicine daily.

## 2018-10-20 NOTE — Discharge Instructions (Signed)
Take your usual prescriptions as previously directed.  Go to your Pain Management doctor tomorrow as previously scheduled. Call your regular medical doctor tomorrow to schedule a follow up appointment within the next week.  Return to the Emergency Department immediately sooner if worsening.

## 2018-11-04 ENCOUNTER — Telehealth: Payer: Self-pay | Admitting: Orthopedic Surgery

## 2018-11-04 NOTE — Telephone Encounter (Signed)
No he can not

## 2018-11-04 NOTE — Telephone Encounter (Signed)
Patient called, relaying that his "same" right knee has fluid on it again; asking if he can come in within the next few days? Please advise based on current scheduling / re-scheduling status.

## 2018-11-04 NOTE — Telephone Encounter (Signed)
Relayed to patient; voiced understanding. Will do best he can and call back to re-check as to whether restrictions for scheduling amidst COVID-19 situation may have been lifted.

## 2018-11-26 ENCOUNTER — Telehealth: Payer: Self-pay | Admitting: Orthopedic Surgery

## 2018-11-26 ENCOUNTER — Other Ambulatory Visit: Payer: Self-pay

## 2018-11-26 ENCOUNTER — Encounter (HOSPITAL_COMMUNITY): Payer: Self-pay | Admitting: Emergency Medicine

## 2018-11-26 ENCOUNTER — Emergency Department (HOSPITAL_COMMUNITY)
Admission: EM | Admit: 2018-11-26 | Discharge: 2018-11-26 | Disposition: A | Discharge status: Home / Self Care | Payer: Medicare Other | Attending: Emergency Medicine | Admitting: Emergency Medicine

## 2018-11-26 DIAGNOSIS — M1000 Idiopathic gout, unspecified site: Secondary | ICD-10-CM | POA: Diagnosis not present

## 2018-11-26 DIAGNOSIS — I10 Essential (primary) hypertension: Secondary | ICD-10-CM | POA: Diagnosis not present

## 2018-11-26 DIAGNOSIS — M25572 Pain in left ankle and joints of left foot: Secondary | ICD-10-CM | POA: Diagnosis present

## 2018-11-26 DIAGNOSIS — Z79899 Other long term (current) drug therapy: Secondary | ICD-10-CM | POA: Insufficient documentation

## 2018-11-26 MED ORDER — COLCHICINE 0.6 MG PO TABS
1.2000 mg | ORAL_TABLET | Freq: Once | ORAL | Status: AC
  Administered 2018-11-26: 10:00:00 1.2 mg via ORAL
  Filled 2018-11-26: qty 2

## 2018-11-26 MED ORDER — ONDANSETRON HCL 4 MG PO TABS
4.0000 mg | ORAL_TABLET | Freq: Once | ORAL | Status: AC
  Administered 2018-11-26: 4 mg via ORAL
  Filled 2018-11-26: qty 1

## 2018-11-26 MED ORDER — DEXAMETHASONE 4 MG PO TABS: 4.0000 mg | ORAL_TABLET | Freq: Two times a day (BID) | ORAL | 0 refills | Status: DC

## 2018-11-26 MED ORDER — MORPHINE SULFATE (PF) 10 MG/ML IV SOLN
10.0000 mg | Freq: Once | INTRAVENOUS | Status: AC
  Administered 2018-11-26: 10 mg via INTRAMUSCULAR
  Filled 2018-11-26: qty 1

## 2018-11-26 MED ORDER — DEXAMETHASONE SODIUM PHOSPHATE 10 MG/ML IJ SOLN
10.0000 mg | Freq: Once | INTRAMUSCULAR | Status: AC
  Administered 2018-11-26: 10:00:00 10 mg via INTRAMUSCULAR
  Filled 2018-11-26: qty 1

## 2018-11-26 MED ORDER — HYDROCODONE-ACETAMINOPHEN 5-325 MG PO TABS: 1.0000 | ORAL_TABLET | ORAL | 0 refills | Status: DC | PRN

## 2018-11-26 NOTE — Telephone Encounter (Addendum)
Patient called asking to be seen. States his knee is swollen and needs to be aspirated. I told him that with the COVID-19 restrictions I would send you a message to see what/when you suggest for him. He stated he understood.  Please advise

## 2018-11-26 NOTE — ED Triage Notes (Signed)
Pt c/o of bilateral knee pain since last night.  HX of gout.  Pt states " I need  Shot for my flare up"
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
## 2018-11-26 NOTE — ED Notes (Signed)
Advised patient not to drive after discharge due to narcotic medication administration. Also advised patient not to drive while taking prescription pain medication. Patient verbalized understanding. Discharged via wheelchair to waiting room to wait for ride home.

## 2018-11-26 NOTE — Discharge Instructions (Addendum)
Please keep your knee and your ankle warm.  Use your walker or your crutches to help take some of the pressure off of your knee and your ankle.  Please see Dr. Aline Brochure on tomorrow as scheduled.  Use Decadron 2 times daily with food.  Use Tylenol extra strength for mild pain.  Use Norco for more severe pain. This medication may cause drowsiness. Please do not drink, drive, or participate in activity that requires concentration while taking this medication.

## 2018-11-27 NOTE — Telephone Encounter (Signed)
Tuesday 10am

## 2018-12-03 ENCOUNTER — Encounter: Payer: Self-pay | Admitting: Orthopedic Surgery

## 2018-12-03 ENCOUNTER — Ambulatory Visit: Payer: Medicare Other | Admitting: Orthopedic Surgery

## 2018-12-19 ENCOUNTER — Other Ambulatory Visit: Payer: Self-pay

## 2018-12-19 ENCOUNTER — Emergency Department (HOSPITAL_COMMUNITY): Payer: Medicare Other

## 2018-12-19 ENCOUNTER — Encounter (HOSPITAL_COMMUNITY): Payer: Self-pay | Admitting: Emergency Medicine

## 2018-12-19 ENCOUNTER — Observation Stay (HOSPITAL_COMMUNITY)
Admission: EM | Admit: 2018-12-19 | Discharge: 2018-12-20 | Disposition: A | Discharge status: Home / Self Care | Payer: Medicare Other | Attending: Internal Medicine | Admitting: Internal Medicine

## 2018-12-19 DIAGNOSIS — F1410 Cocaine abuse, uncomplicated: Secondary | ICD-10-CM | POA: Diagnosis not present

## 2018-12-19 DIAGNOSIS — I10 Essential (primary) hypertension: Secondary | ICD-10-CM | POA: Insufficient documentation

## 2018-12-19 DIAGNOSIS — Z79899 Other long term (current) drug therapy: Secondary | ICD-10-CM | POA: Insufficient documentation

## 2018-12-19 DIAGNOSIS — M1000 Idiopathic gout, unspecified site: Secondary | ICD-10-CM | POA: Insufficient documentation

## 2018-12-19 DIAGNOSIS — Z1159 Encounter for screening for other viral diseases: Secondary | ICD-10-CM | POA: Diagnosis not present

## 2018-12-19 DIAGNOSIS — M170 Bilateral primary osteoarthritis of knee: Secondary | ICD-10-CM

## 2018-12-19 DIAGNOSIS — M1A9XX0 Chronic gout, unspecified, without tophus (tophi): Secondary | ICD-10-CM

## 2018-12-19 DIAGNOSIS — Z96652 Presence of left artificial knee joint: Secondary | ICD-10-CM | POA: Insufficient documentation

## 2018-12-19 DIAGNOSIS — R079 Chest pain, unspecified: Secondary | ICD-10-CM | POA: Diagnosis present

## 2018-12-19 DIAGNOSIS — R0789 Other chest pain: Secondary | ICD-10-CM | POA: Diagnosis not present

## 2018-12-19 DIAGNOSIS — M109 Gout, unspecified: Secondary | ICD-10-CM | POA: Diagnosis present

## 2018-12-19 DIAGNOSIS — M1990 Unspecified osteoarthritis, unspecified site: Secondary | ICD-10-CM | POA: Diagnosis present

## 2018-12-19 DIAGNOSIS — F1490 Cocaine use, unspecified, uncomplicated: Secondary | ICD-10-CM

## 2018-12-19 LAB — BASIC METABOLIC PANEL
Anion gap: 10 (ref 5–15)
BUN: 10 mg/dL (ref 6–20)
CO2: 23 mmol/L (ref 22–32)
Calcium: 9.1 mg/dL (ref 8.9–10.3)
Chloride: 102 mmol/L (ref 98–111)
Creatinine, Ser: 0.93 mg/dL (ref 0.61–1.24)
GFR calc Af Amer: >60 mL/min (ref >60)
GFR calc non Af Amer: >60 mL/min (ref >60)
Glucose, Bld: 92 mg/dL (ref 70–99)
Potassium: 3.6 mmol/L (ref 3.5–5.1)
Sodium: 135 mmol/L (ref 135–145)

## 2018-12-19 LAB — CBC
HCT: 45.8 % (ref 39.0–52.0)
Hemoglobin: 15.4 g/dL (ref 13.0–17.0)
MCH: 33 pg (ref 26.0–34.0)
MCHC: 33.6 g/dL (ref 30.0–36.0)
MCV: 98.3 fL (ref 80.0–100.0)
Platelets: 225 10*3/uL (ref 150–400)
RBC: 4.66 MIL/uL (ref 4.22–5.81)
RDW: 13.2 % (ref 11.5–15.5)
WBC: 8.8 10*3/uL (ref 4.0–10.5)
nRBC: 0 % (ref 0.0–0.2)

## 2018-12-19 LAB — POCT I-STAT TROPONIN I: Troponin i, poc: 0.01 ng/mL (ref 0.00–0.08)

## 2018-12-19 LAB — D-DIMER, QUANTITATIVE (NOT AT ARMC): D-Dimer, Quant: 1.31 ug/mL-FEU — ABNORMAL HIGH (ref 0.00–0.50)

## 2018-12-19 LAB — POCT I-STAT 4, (NA,K, GLUC, HGB,HCT)
Glucose, Bld: 86 mg/dL (ref 70–99)
HCT: 46 % (ref 39.0–52.0)
Hemoglobin: 15.6 g/dL (ref 13.0–17.0)
Potassium: 3.7 mmol/L (ref 3.5–5.1)
Sodium: 139 mmol/L (ref 135–145)

## 2018-12-19 LAB — TROPONIN I: Troponin I: <0.03 ng/mL (ref <0.03)

## 2018-12-19 LAB — SARS CORONAVIRUS 2 BY RT PCR (HOSPITAL ORDER, PERFORMED IN ~~LOC~~ HOSPITAL LAB): SARS Coronavirus 2: NEGATIVE

## 2018-12-19 IMAGING — CR PORTABLE CHEST - 1 VIEW
1 series · 1 of 1 positions shown · non-contrast
Comparison: [DATE]

CLINICAL DATA: Chest pain

EXAM:
PORTABLE CHEST 1 VIEW

[portable]
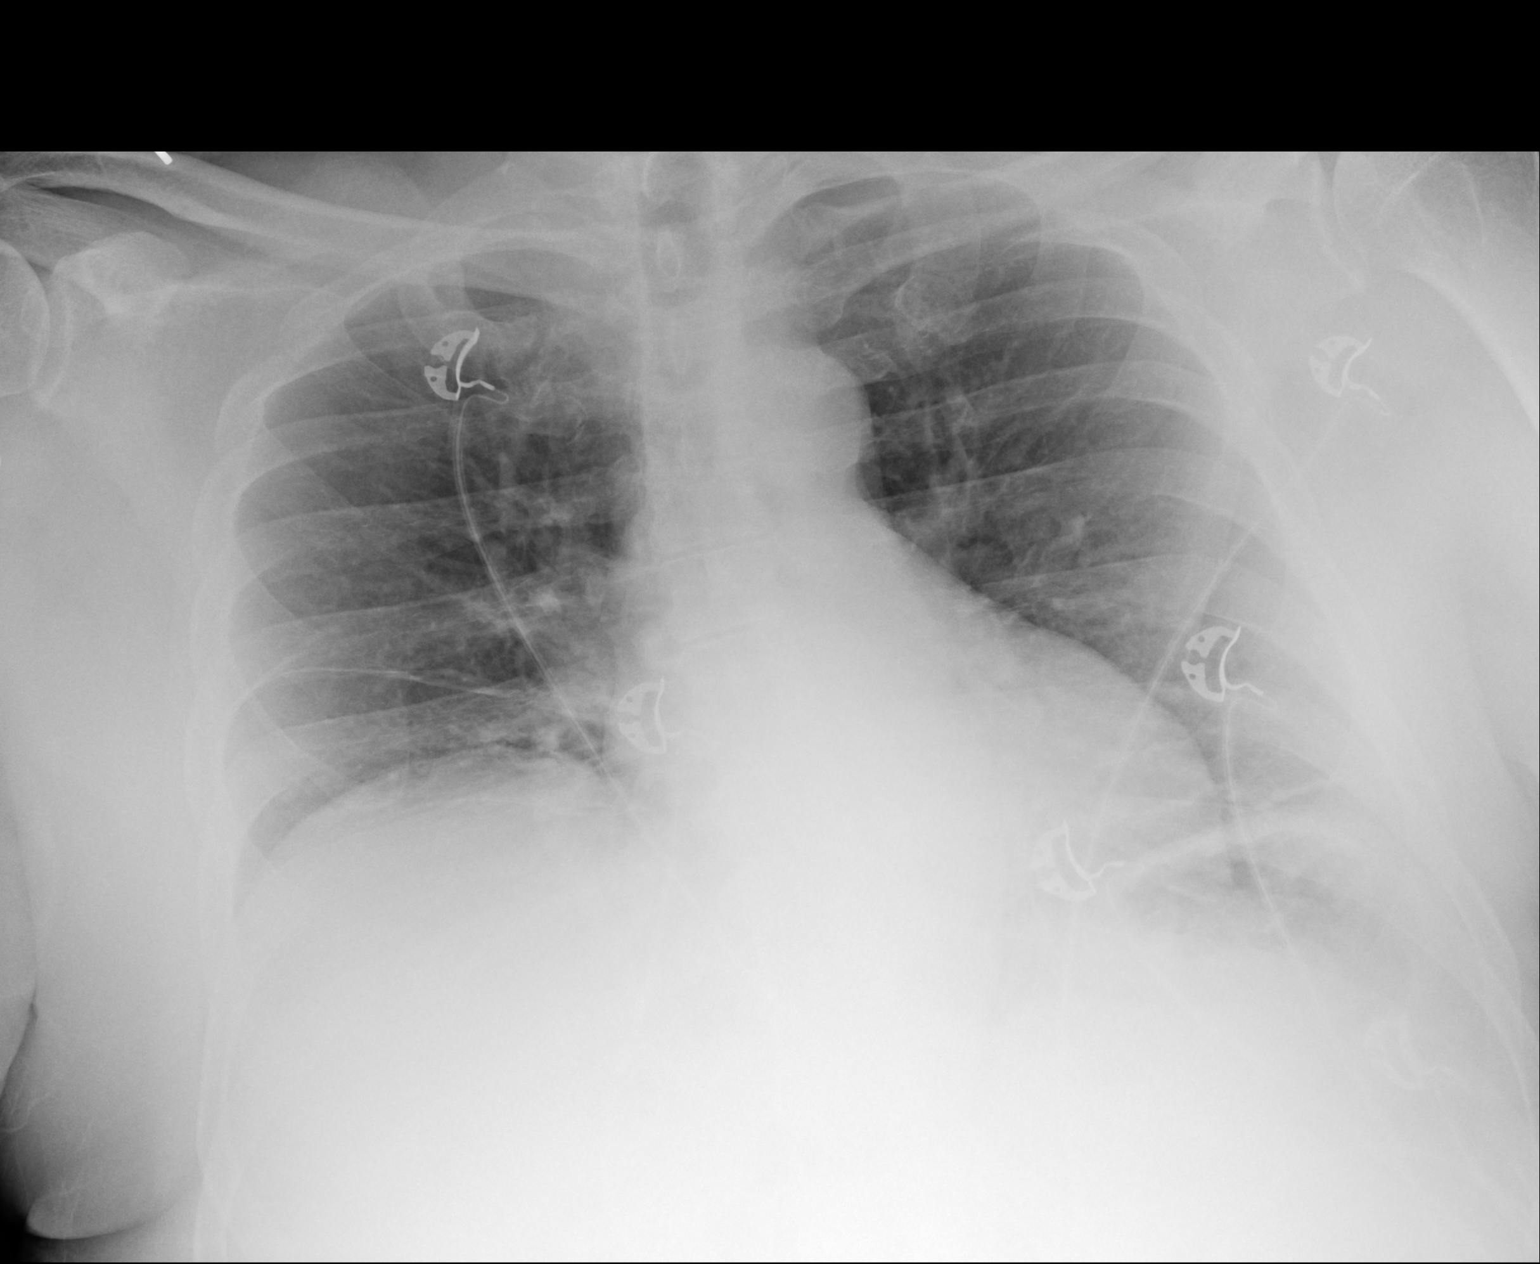

[1 of 1 positions shown; findings below may reference images not displayed]

FINDINGS: The heart size and mediastinal contours are within normal limits.
Both lungs are clear. The visualized skeletal structures are
unremarkable.
IMPRESSION: No active disease.

## 2018-12-19 IMAGING — CT CT ANGIOGRAPHY CHEST
2 of 6 series · 19 of 46 positions shown · IV contrast (Isovue)
Comparison: None.

CLINICAL DATA: Chest pain since this morning.

EXAM:
CT ANGIOGRAPHY CHEST WITH CONTRAST
TECHNIQUE: Multidetector CT imaging of the chest was performed using the
standard protocol during bolus administration of intravenous
contrast. Multiplanar CT image reconstructions and MIPs were
obtained to evaluate the vascular anatomy.
CONTRAST:  100mL OMNIPAQUE IOHEXOL 350 MG/ML SOLN

[Series 5: thins · axial · 0.68mm/px · z∈[-130,+154]mm · 16 of 311 slices shown]
[im 14/311  lung]
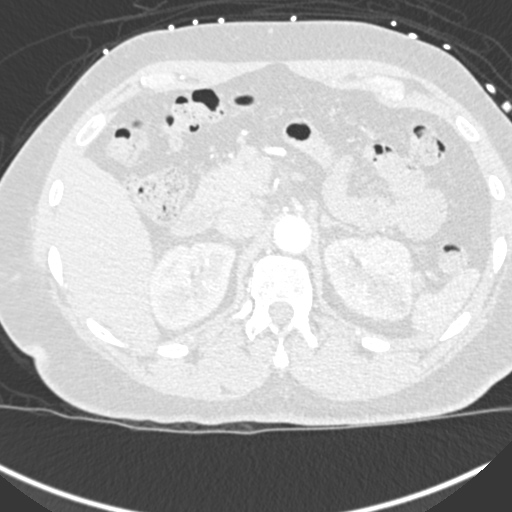
[im 41/311  soft-tissue]
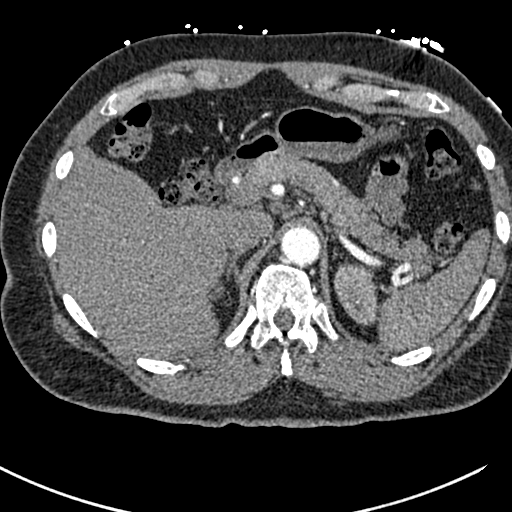
[im 54/311  lung]
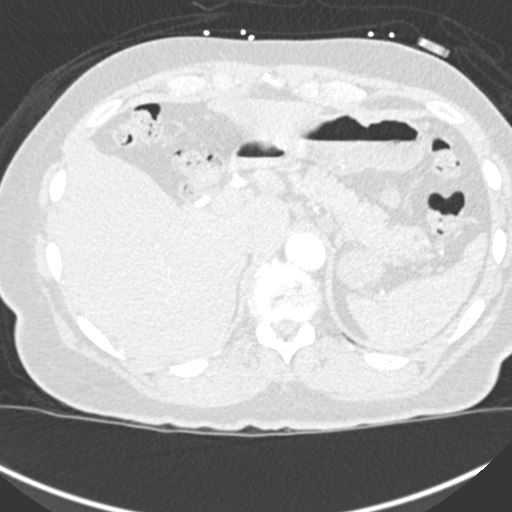
[im 68/311  soft-tissue]
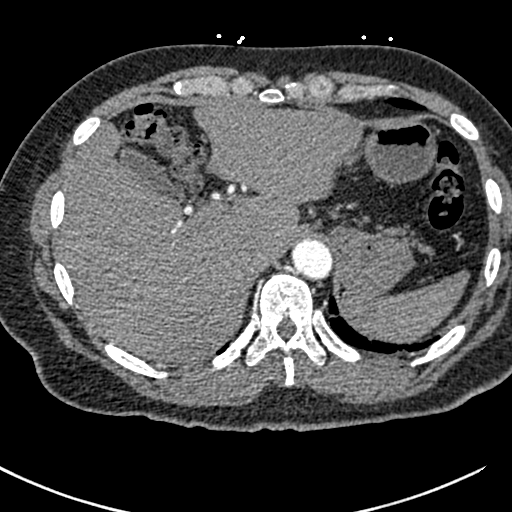
[im 95/311  lung]
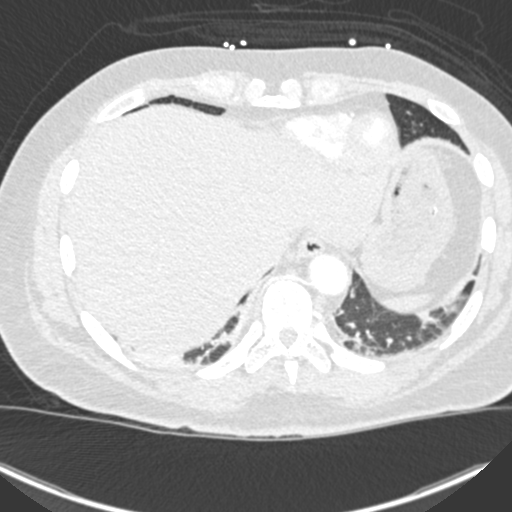
[im 108/311  soft-tissue]
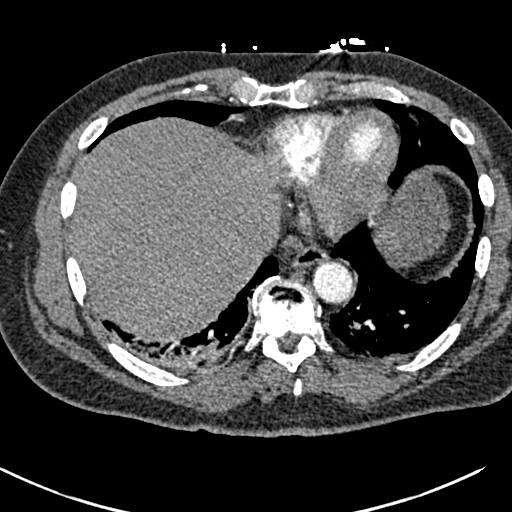
[im 122/311  lung]
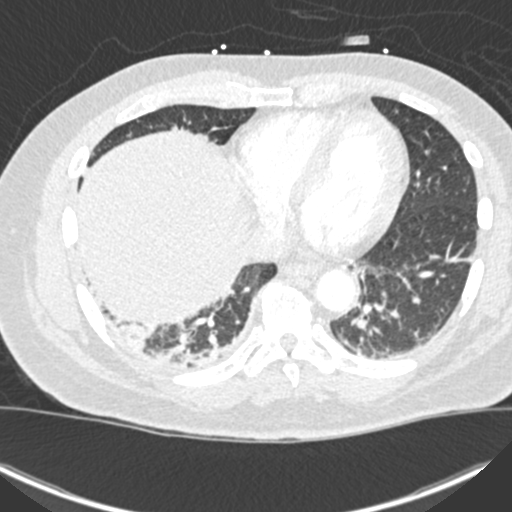
[im 149/311  soft-tissue]
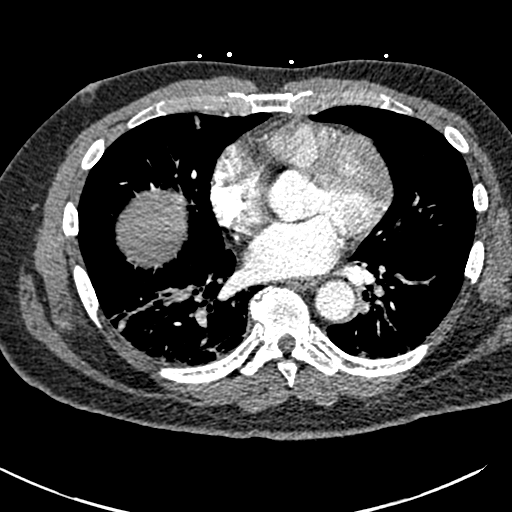
[im 162/311  lung]
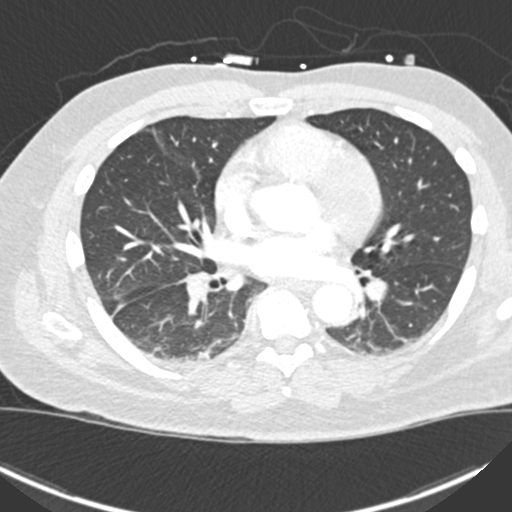
[im 189/311  soft-tissue]
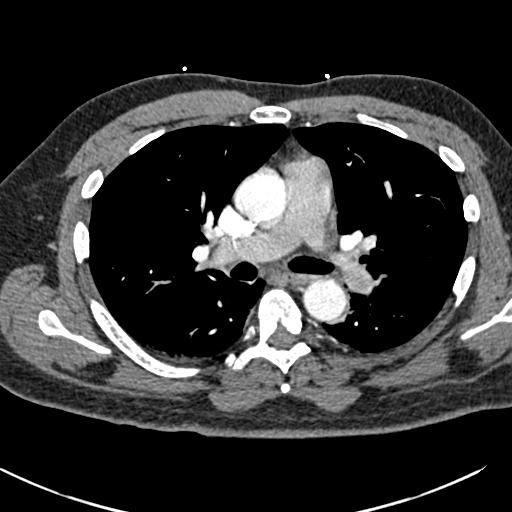
[im 203/311  lung]
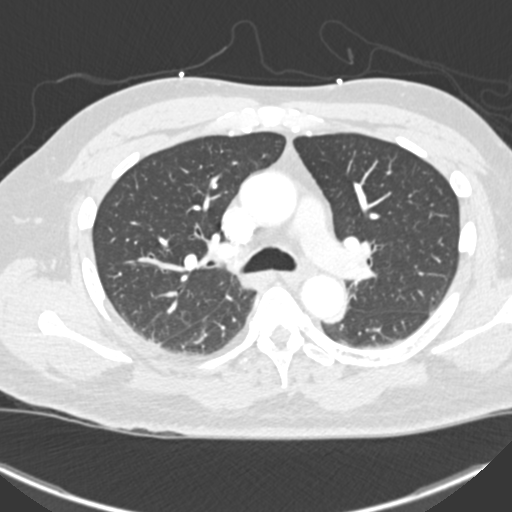
[im 216/311  soft-tissue]
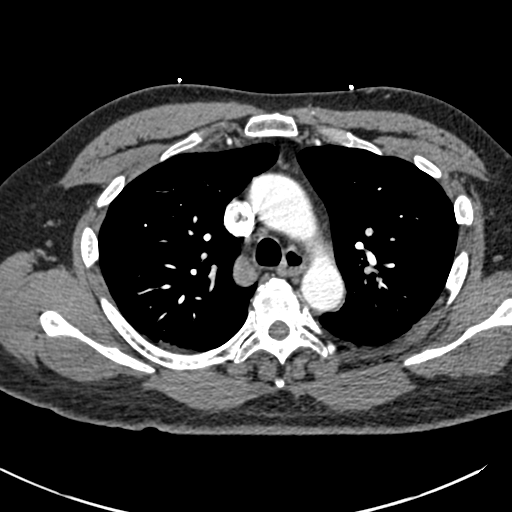
[im 243/311  lung]
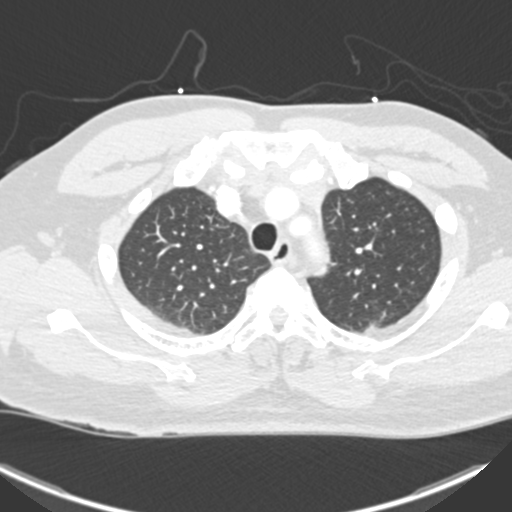
[im 257/311  soft-tissue]
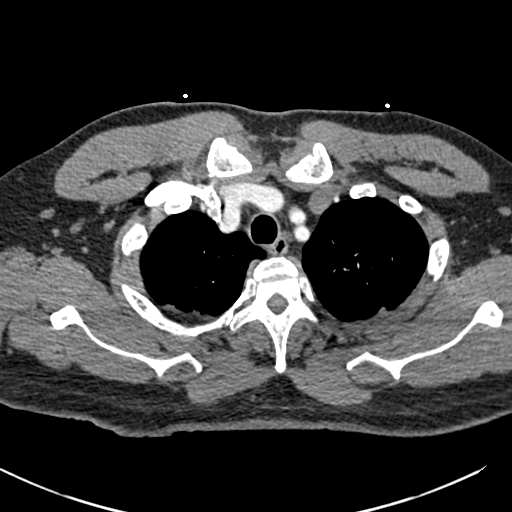
[im 270/311  lung]
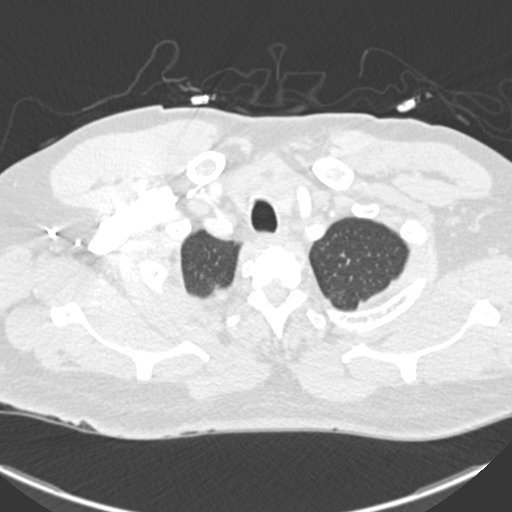
[im 297/311  soft-tissue]
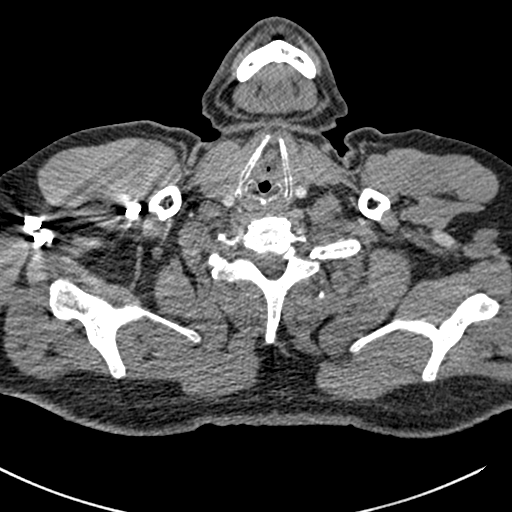

[Series 7: coronal mpr · coronal · 0.61mm/px · 3 of 151 slices shown]
[im 38/151  soft-tissue]
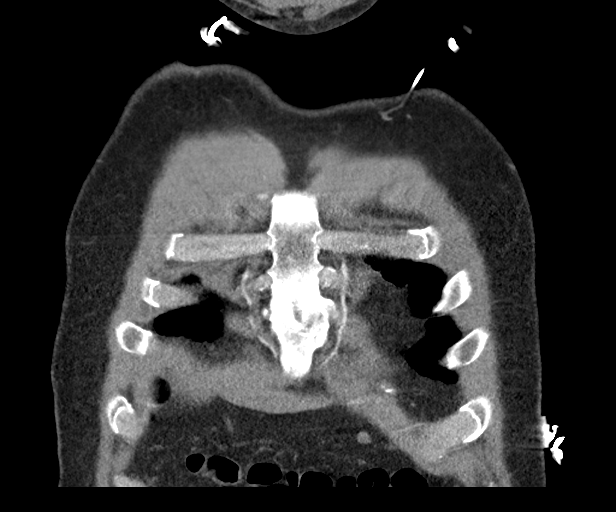
[im 76/151  soft-tissue]
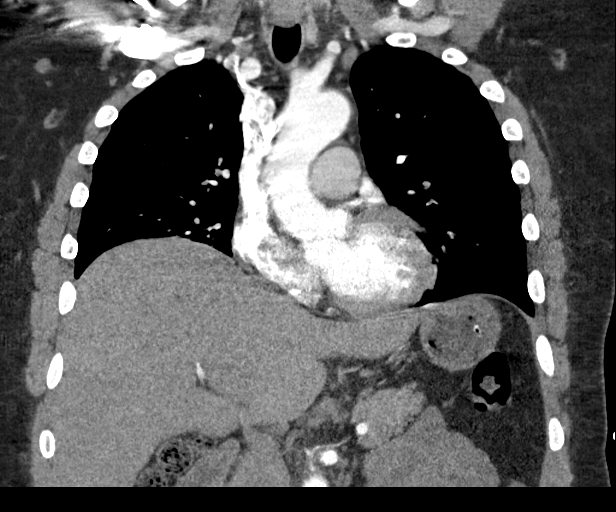
[im 113/151  soft-tissue]
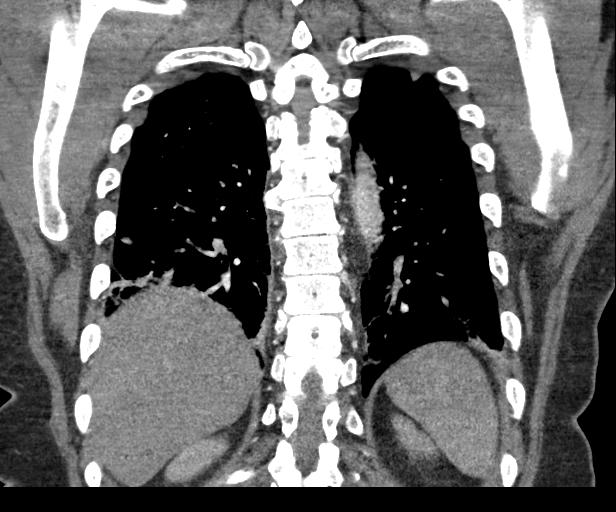

[19 of 46 positions shown; findings below may reference images not displayed]

FINDINGS: Cardiovascular: Suboptimal opacification of the pulmonary arteries
which are visualized to the lobar branches without evidence of
pulmonary embolism. Normal heart size. No pericardial effusion.
Thoracic aorta is normal in caliber. No thoracic aortic dissection.

Mediastinum/Nodes: No enlarged mediastinal, hilar, or axillary lymph
nodes. Thyroid gland, trachea, and esophagus demonstrate no
significant findings.

Lungs/Pleura: Lungs are clear. No pleural effusion or pneumothorax.
Bibasilar atelectasis.

Upper Abdomen: No acute abnormality.

Musculoskeletal: No chest wall abnormality. No acute or significant
osseous findings.

Review of the MIP images confirms the above findings.
IMPRESSION: 1. Suboptimal opacification of the pulmonary arteries which are
visualized to the lobar branches without evidence of pulmonary
embolism.
2. No acute cardiopulmonary disease.

## 2018-12-19 MED ORDER — KETOROLAC TROMETHAMINE 30 MG/ML IJ SOLN
15.0000 mg | Freq: Once | INTRAMUSCULAR | Status: AC
  Administered 2018-12-19: 15 mg via INTRAVENOUS
  Filled 2018-12-19: qty 1

## 2018-12-19 MED ORDER — OXYCODONE-ACETAMINOPHEN 7.5-325 MG PO TABS
1.0000 | ORAL_TABLET | Freq: Four times a day (QID) | ORAL | Status: DC | PRN
  Administered 2018-12-19 – 2018-12-20 (×2): 1 via ORAL
  Filled 2018-12-19 (×2): qty 1

## 2018-12-19 MED ORDER — BACLOFEN 10 MG PO TABS: 5.0000 mg | ORAL_TABLET | Freq: Three times a day (TID) | ORAL | Status: DC | PRN

## 2018-12-19 MED ORDER — ENOXAPARIN SODIUM 60 MG/0.6ML ~~LOC~~ SOLN
60.0000 mg | SUBCUTANEOUS | Status: DC
  Administered 2018-12-19: 60 mg via SUBCUTANEOUS
  Filled 2018-12-19: qty 0.6

## 2018-12-19 MED ORDER — METOPROLOL TARTRATE 25 MG PO TABS
12.5000 mg | ORAL_TABLET | Freq: Two times a day (BID) | ORAL | Status: DC
  Administered 2018-12-19: 22:00:00 12.5 mg via ORAL
  Filled 2018-12-19: qty 1

## 2018-12-19 MED ORDER — IOHEXOL 350 MG/ML SOLN
100.0000 mL | Freq: Once | INTRAVENOUS | Status: AC | PRN
  Administered 2018-12-19: 18:00:00 100 mL via INTRAVENOUS

## 2018-12-19 MED ORDER — COLCHICINE 0.6 MG PO TABS
0.6000 mg | ORAL_TABLET | Freq: Every day | ORAL | Status: DC
  Administered 2018-12-20: 0.6 mg via ORAL
  Filled 2018-12-19: qty 1

## 2018-12-19 MED ORDER — MORPHINE SULFATE (PF) 2 MG/ML IV SOLN
1.0000 mg | INTRAVENOUS | Status: DC | PRN
  Administered 2018-12-19 – 2018-12-20 (×3): 1 mg via INTRAVENOUS
  Filled 2018-12-19 (×3): qty 1

## 2018-12-19 MED ORDER — MORPHINE SULFATE (PF) 4 MG/ML IV SOLN
8.0000 mg | Freq: Once | INTRAVENOUS | Status: AC
  Administered 2018-12-19: 8 mg via INTRAVENOUS
  Filled 2018-12-19: qty 2

## 2018-12-19 MED ORDER — NITROGLYCERIN 0.4 MG SL SUBL
0.4000 mg | SUBLINGUAL_TABLET | SUBLINGUAL | Status: DC | PRN
  Administered 2018-12-19: 0.4 mg via SUBLINGUAL
  Filled 2018-12-19: qty 1

## 2018-12-19 MED ORDER — SODIUM CHLORIDE 0.9 % IV BOLUS
1000.0000 mL | Freq: Once | INTRAVENOUS | Status: AC
  Administered 2018-12-19: 1000 mL via INTRAVENOUS

## 2018-12-19 MED ORDER — ASPIRIN EC 325 MG PO TBEC
325.0000 mg | DELAYED_RELEASE_TABLET | Freq: Every day | ORAL | Status: DC
  Administered 2018-12-20: 325 mg via ORAL
  Filled 2018-12-19: qty 1

## 2018-12-19 MED ORDER — ASPIRIN 81 MG PO CHEW
324.0000 mg | CHEWABLE_TABLET | Freq: Once | ORAL | Status: AC
  Administered 2018-12-19: 324 mg via ORAL
  Filled 2018-12-19: qty 4

## 2018-12-19 MED ORDER — SODIUM CHLORIDE 0.9% FLUSH
3.0000 mL | Freq: Once | INTRAVENOUS | Status: AC
  Administered 2018-12-19: 3 mL via INTRAVENOUS

## 2018-12-19 MED ORDER — SODIUM CHLORIDE 0.9 % IV SOLN
INTRAVENOUS | Status: AC
  Administered 2018-12-19: 22:00:00 via INTRAVENOUS

## 2018-12-19 MED ORDER — GABAPENTIN 300 MG PO CAPS
300.0000 mg | ORAL_CAPSULE | Freq: Two times a day (BID) | ORAL | Status: DC
  Administered 2018-12-19 – 2018-12-20 (×2): 300 mg via ORAL
  Filled 2018-12-19 (×2): qty 1

## 2018-12-19 MED ORDER — ATORVASTATIN CALCIUM 40 MG PO TABS: 80.0000 mg | ORAL_TABLET | Freq: Every day | ORAL | Status: DC

## 2018-12-19 NOTE — TOC Initial Note (Signed)
Transition of Care [facility]) - Initial/Assessment Note    Patient Details  Name: Tyler Hart MRN: [id number] Date of Birth: April 23, 1961  Transition of Care [facility]) CM/SW Contact:    Sophire Dimitri Ped, LCSW Phone Number: 12/19/2018, 11:20 PM  Clinical Narrative:   CSW met with pt at bedside to conduct TOC assessment. Pt states that prior to being admitted into the ED, he resided at home with his fiance. Pt states that he has a PCP that he see's regularly every 3 months ( Dr. Legrand Rams). Pt also reports that he see's a provider for pain management at [facility] in [place].   Pt states that he ambulates, toilets and bathes independently without the use of assistive devices. Pt has no hx of home health. Pt transports himself to and from appointments independently as well. When asked who would be able to provide additional assistance if needed upon discharge, pt states that his brother would be able to assist him.   TOC team to continue to follow for any discharge needs.        Expected Discharge Plan: Home/Self Care Barriers to Discharge: No Barriers Identified   Patient Goals and CMS Choice Patient states their goals for this hospitalization and ongoing recovery are:: to return home       Expected Discharge Plan and Services Expected Discharge Plan: Home/Self Care In-house Referral: Clinical Social Work     Living arrangements for the past 2 months: Single Family Home                                      Prior Living Arrangements/Services Living arrangements for the past 2 months: Single Family Home Lives with:: Significant Other Patient language and need for interpreter reviewed:: Yes Do you feel safe going back to the place where you live?: Yes      Need for Family Participation in Patient Care: No (Comment) Care giver support system in place?: No (comment)   Criminal Activity/Legal Involvement Pertinent to Current Situation/Hospitalization: No - Comment as  needed  Activities of Daily Living      Permission Sought/Granted Permission sought to share information with : Facility Sport and exercise psychologist, Case Optician, dispensing granted to share information with : Yes, Verbal Permission Granted              Emotional Assessment Appearance:: Appears stated age Attitude/Demeanor/Rapport: Engaged Affect (typically observed): Accepting, Calm, Appropriate Orientation: : Oriented to Self, Oriented to Situation, Oriented to Place, Oriented to  Time   Psych Involvement: No (comment)  Admission diagnosis:  Atypical chest pain [R07.89] Chest pain [R07.9] Patient Active Problem List   Diagnosis Date Noted  . Chest pain 12/19/2018  . Gout 12/19/2018  . Osteoarthritis 12/19/2018  . Helicobacter pylori gastritis 05/30/2018  . 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 Chronic hepatitis C virus infection ([place]) 10/11/2016  . Colon adenomas 10/11/2016  . Primary osteoarthritis of left knee 04/06/2016  . Puncture wound of lower leg without foreign body   . Visit for wound care    PCP:  Rosita Fire, MD Pharmacy:   [facility], [address]. [place] [address] Phone: [phone] Fax: [phone]  [place], [place] - [place] [address] [place] [address] [place] [address] Phone: [phone]  Fax: [phone]     Social Determinants of Health (SDOH) Interventions    Readmission Risk Interventions No flowsheet data found.
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
## 2018-12-19 NOTE — ED Triage Notes (Signed)
Pt states that he has been chest pain this morning. He states that it goes to his neck back

## 2018-12-19 NOTE — ED Notes (Signed)
Patient given snacks and soda.
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
## 2018-12-19 NOTE — Care Management Obs Status (Signed)
[facility] NOTIFICATION   Patient Details  Name: Tyler Hart MRN: [id number] Date of Birth: June 11, 1961   Medicare Observation Status Notification Given:  Yes    [place], LCSW 12/19/2018, 11:06 PM

## 2018-12-20 ENCOUNTER — Observation Stay (HOSPITAL_BASED_OUTPATIENT_CLINIC_OR_DEPARTMENT_OTHER): Payer: Medicare Other

## 2018-12-20 ENCOUNTER — Encounter (HOSPITAL_COMMUNITY): Payer: Self-pay | Admitting: Cardiology

## 2018-12-20 DIAGNOSIS — F1490 Cocaine use, unspecified, uncomplicated: Secondary | ICD-10-CM

## 2018-12-20 DIAGNOSIS — I361 Nonrheumatic tricuspid (valve) insufficiency: Secondary | ICD-10-CM

## 2018-12-20 DIAGNOSIS — I10 Essential (primary) hypertension: Secondary | ICD-10-CM | POA: Diagnosis not present

## 2018-12-20 DIAGNOSIS — M1000 Idiopathic gout, unspecified site: Secondary | ICD-10-CM

## 2018-12-20 DIAGNOSIS — F1410 Cocaine abuse, uncomplicated: Secondary | ICD-10-CM

## 2018-12-20 DIAGNOSIS — R0789 Other chest pain: Secondary | ICD-10-CM | POA: Diagnosis not present

## 2018-12-20 LAB — CBC
HCT: 44.1 % (ref 39.0–52.0)
Hemoglobin: 14.4 g/dL (ref 13.0–17.0)
MCH: 33 pg (ref 26.0–34.0)
MCHC: 32.7 g/dL (ref 30.0–36.0)
MCV: 100.9 fL — ABNORMAL HIGH (ref 80.0–100.0)
Platelets: 209 10*3/uL (ref 150–400)
RBC: 4.37 MIL/uL (ref 4.22–5.81)
RDW: 13.2 % (ref 11.5–15.5)
WBC: 8 10*3/uL (ref 4.0–10.5)
nRBC: 0 % (ref 0.0–0.2)

## 2018-12-20 LAB — RAPID URINE DRUG SCREEN, HOSP PERFORMED
Amphetamines: NOT DETECTED
Barbiturates: NOT DETECTED
Benzodiazepines: NOT DETECTED
Cocaine: POSITIVE — AB
Opiates: POSITIVE — AB
Tetrahydrocannabinol: NOT DETECTED

## 2018-12-20 LAB — ECHOCARDIOGRAM LIMITED
Height: 74.5 in
Weight: 3925.95 oz

## 2018-12-20 LAB — COMPREHENSIVE METABOLIC PANEL
ALT: 22 U/L (ref 0–44)
AST: 22 U/L (ref 15–41)
Albumin: 3.1 g/dL — ABNORMAL LOW (ref 3.5–5.0)
Alkaline Phosphatase: 45 U/L (ref 38–126)
Anion gap: 7 (ref 5–15)
BUN: 10 mg/dL (ref 6–20)
CO2: 26 mmol/L (ref 22–32)
Calcium: 8.6 mg/dL — ABNORMAL LOW (ref 8.9–10.3)
Chloride: 104 mmol/L (ref 98–111)
Creatinine, Ser: 0.93 mg/dL (ref 0.61–1.24)
GFR calc Af Amer: >60 mL/min (ref >60)
GFR calc non Af Amer: >60 mL/min (ref >60)
Glucose, Bld: 92 mg/dL (ref 70–99)
Potassium: 3.9 mmol/L (ref 3.5–5.1)
Sodium: 137 mmol/L (ref 135–145)
Total Bilirubin: 1 mg/dL (ref 0.3–1.2)
Total Protein: 6.9 g/dL (ref 6.5–8.1)

## 2018-12-20 LAB — LIPID PANEL
Cholesterol: 165 mg/dL (ref 0–200)
HDL: 42 mg/dL (ref >40)
LDL Cholesterol: 106 mg/dL — ABNORMAL HIGH (ref 0–99)
Total CHOL/HDL Ratio: 3.9 RATIO
Triglycerides: 84 mg/dL (ref <150)
VLDL: 17 mg/dL (ref 0–40)

## 2018-12-20 LAB — TROPONIN I
Troponin I: <0.03 ng/mL (ref <0.03)
Troponin I: <0.03 ng/mL (ref <0.03)

## 2018-12-20 LAB — URIC ACID: Uric Acid, Serum: 8.3 mg/dL (ref 3.7–8.6)

## 2018-12-20 MED ORDER — ASPIRIN 81 MG PO TBEC: 81.0000 mg | DELAYED_RELEASE_TABLET | Freq: Every day | ORAL | Status: DC

## 2018-12-20 MED ORDER — PREDNISONE 20 MG PO TABS: 50.0000 mg | ORAL_TABLET | Freq: Every day | ORAL | Status: DC

## 2018-12-20 MED ORDER — AMLODIPINE BESYLATE 5 MG PO TABS: 5.0000 mg | ORAL_TABLET | Freq: Every day | ORAL | 1 refills | Status: DC

## 2018-12-20 MED ORDER — PREDNISONE 50 MG PO TABS: 50.0000 mg | ORAL_TABLET | Freq: Every day | ORAL | 0 refills | Status: DC

## 2018-12-20 MED ORDER — METHYLPREDNISOLONE SODIUM SUCC 125 MG IJ SOLR
60.0000 mg | Freq: Once | INTRAMUSCULAR | Status: AC
  Administered 2018-12-20: 60 mg via INTRAVENOUS
  Filled 2018-12-20: qty 2

## 2018-12-20 MED ORDER — ASPIRIN EC 81 MG PO TBEC: 81.0000 mg | DELAYED_RELEASE_TABLET | Freq: Every day | ORAL | Status: DC

## 2018-12-20 MED ORDER — AMLODIPINE BESYLATE 5 MG PO TABS
5.0000 mg | ORAL_TABLET | Freq: Every day | ORAL | Status: DC
  Administered 2018-12-20: 11:00:00 5 mg via ORAL
  Filled 2018-12-20: qty 1

## 2018-12-20 NOTE — Progress Notes (Signed)
   Progress Note  Patient Name: Tyler Hart Date of Encounter: 12/20/2018  Please see full consultation note from this morning.  Follow-up troponin I level is normal.  Echocardiogram reveals mild LVH with LVEF 60 to 65% and no regional wall motion abnormalities.  [facility] will sign off.   Medication Recommendations: Would avoid beta-blockers with patient's history of cocaine abuse. Other recommendations (labs, testing, etc): No further cardiac testing is planned at this time.  Cessation of cocaine was discussed with the patient.  He also needs to keep close follow-up with PCP Dr. Legrand Rams to determine whether hypertension requires medical therapy and for other general risk factor modification.  He does not require cardiology follow-up at this point, could be referred back by Dr. Legrand Rams if the need arises.  Signed, Rozann Lesches, MD  12/20/2018, 10:40 AM

## 2018-12-20 NOTE — Progress Notes (Signed)
*  PRELIMINARY RESULTS* Echocardiogram Limited 2-D Echocardiogram has been performed.  Tyler Hart 12/20/2018, 9:46 AM

## 2018-12-20 NOTE — Progress Notes (Signed)
IV removed and discharge instructions reviewed. Scripts sent to pharmacy and patient's wife to transport home

## 2018-12-20 NOTE — Progress Notes (Signed)
Has complained of right chest and shoulder pain which he says occurs intermittently from gout.  Just gave morphine.
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
## 2018-12-20 NOTE — Discharge Summary (Signed)
Physician Discharge Summary  Tyler Hart:[id number] DOB: 12/30/1960 DOA: 12/19/2018  PCP: Rosita Fire, MD  Admit date: 12/19/2018 Discharge date: 12/20/2018  Admitted From: Home Disposition:  Home   Recommendations for Outpatient Follow-up:  1. Follow up with PCP in 1-2 weeks 2. Please obtain BMP/CBC in one week    Discharge Condition: Stable CODE STATUS: FULL Diet recommendation: Heart Healthy   Brief/Interim Summary: 58 year old male with a history of hypertension treated hepatitis C, and family history of CAD presenting with sharp chest pain beginning in the morning of 12/19/2018 radiating to the right shoulder and right neck.  The patient states that he was watching television at the time.  His chest pain lasted over 5 hours without any associated nausea, vomiting, diaphoresis, shortness of breath.  His symptoms were not relieved with sublingual nitroglycerin but were better with IV morphine.  The patient states that he has been in his usual state of health without any increasing dyspnea on exertion, fevers, chills, coughing, hemoptysis. In the emergency department, the patient was afebrile hemodynamically stable saturating 97% on room air.  BMP and CBC were unremarkable peer troponins were negative x2.  Urine drug screen was positive for cocaine and benzodiazepines.  EKG showed sinus rhythm with nonspecific T wave changes.  Cardiology consult was obtained to assist with management.  Chest x-ray was negative for any acute findings  Discharge Diagnoses:  Atypical Chest pain -in part due to cocaine use -troponins neg x 3 -echo--EF 60-65%, no WMA, mild TR -appreciate cardiology consult-->no further work up -CTA chest--neg PE -Continue aspirin  Acute gouty arthritis -Check uric acid--8.3 -IV Solu-Medrol x1-->significant improvement in shoulder and knee -Send patient home with prednisone burst x4 additional days -Continue home dose colchicine  Cocaine abuse -Cessation  discussed  Essential hypertension -Patient has not taken any antihypertensive medications for 8 years -Start low-dose amlodipine  Discharge Instructions   Allergies as of 12/20/2018   No Known Allergies     Medication List    STOP taking these medications   meloxicam 15 MG tablet Commonly known as:  MOBIC     TAKE these medications   amLODipine 5 MG tablet Commonly known as:  NORVASC Take 1 tablet (5 mg total) by mouth daily. Start taking on:  Dec 21, 2018   aspirin 81 MG EC tablet Take 1 tablet (81 mg total) by mouth daily. Start taking on:  Dec 21, 2018   baclofen 10 MG tablet Commonly known as:  LIORESAL TK 1 T PO TID PRN   colchicine 0.6 MG tablet Take 1 tablet (0.6 mg total) by mouth 2 (two) times daily. What changed:  when to take this   gabapentin 300 MG capsule Commonly known as:  NEURONTIN Take 1 capsule (300 mg total) by mouth 3 (three) times daily. What changed:  when to take this   oxyCODONE-acetaminophen 7.5-325 MG tablet Commonly known as:  PERCOCET Take 1 tablet by mouth every 6 (six) hours as needed for pain.   predniSONE 50 MG tablet Commonly known as:  DELTASONE Take 1 tablet (50 mg total) by mouth daily with breakfast. Start taking on:  Dec 21, 2018       No Known Allergies  Consultations:  cardiology   Procedures/Studies: Ct Angio Chest Pe W And/or Wo Contrast  Result Date: 12/19/2018 CLINICAL DATA:  Chest pain since this morning. EXAM: CT ANGIOGRAPHY CHEST WITH CONTRAST TECHNIQUE: Multidetector CT imaging of the chest was performed using the standard protocol during bolus administration of intravenous  contrast. Multiplanar CT image reconstructions and MIPs were obtained to evaluate the vascular anatomy. CONTRAST:  188mL OMNIPAQUE IOHEXOL 350 MG/ML SOLN COMPARISON:  None. FINDINGS: Cardiovascular: Suboptimal opacification of the pulmonary arteries which are visualized to the lobar branches without evidence of pulmonary embolism. Normal  heart size. No pericardial effusion. Thoracic aorta is normal in caliber. No thoracic aortic dissection. Mediastinum/Nodes: No enlarged mediastinal, hilar, or axillary lymph nodes. Thyroid gland, trachea, and esophagus demonstrate no significant findings. Lungs/Pleura: Lungs are clear. No pleural effusion or pneumothorax. Bibasilar atelectasis. Upper Abdomen: No acute abnormality. Musculoskeletal: No chest wall abnormality. No acute or significant osseous findings. Review of the MIP images confirms the above findings. IMPRESSION: 1. Suboptimal opacification of the pulmonary arteries which are visualized to the lobar branches without evidence of pulmonary embolism. 2. No acute cardiopulmonary disease. Electronically Signed   By: Kathreen Devoid   On: 12/19/2018 18:09   Dg Chest Portable 1 View  Result Date: 12/19/2018 CLINICAL DATA:  Chest pain EXAM: PORTABLE CHEST 1 VIEW COMPARISON:  12/30/2010 FINDINGS: The heart size and mediastinal contours are within normal limits. Both lungs are clear. The visualized skeletal structures are unremarkable. IMPRESSION: No active disease. Electronically Signed   By: Kathreen Devoid   On: 12/19/2018 14:59        Discharge Exam: Vitals:   12/20/18 0522 12/20/18 1443  BP: (!) 129/94 (!) 125/91  Pulse: 76 81  Resp: 16 16  Temp: 98.7 F (37.1 C) 98.7 F (37.1 C)  SpO2: 97% 95%   Vitals:   12/19/18 2023 12/20/18 0500 12/20/18 0522 12/20/18 1443  BP: (!) 149/90  (!) 129/94 (!) 125/91  Pulse: 96  76 81  Resp: 18  16 16   Temp: 97.8 F (36.6 C)  98.7 F (37.1 C) 98.7 F (37.1 C)  TempSrc: Oral  Oral Oral  SpO2: 95%  97% 95%  Weight:  111.3 kg    Height:        General: Pt is alert, awake, not in acute distress Cardiovascular: RRR, S1/S2 +, no rubs, no gallops Respiratory: CTA bilaterally, no wheezing, no rhonchi Abdominal: Soft, NT, ND, bowel sounds + Extremities: no edema, no cyanosis   The results of significant diagnostics from this hospitalization  (including imaging, microbiology, ancillary and laboratory) are listed below for reference.    Significant Diagnostic Studies: Ct Angio Chest Pe W And/or Wo Contrast  Result Date: 12/19/2018 CLINICAL DATA:  Chest pain since this morning. EXAM: CT ANGIOGRAPHY CHEST WITH CONTRAST TECHNIQUE: Multidetector CT imaging of the chest was performed using the standard protocol during bolus administration of intravenous contrast. Multiplanar CT image reconstructions and MIPs were obtained to evaluate the vascular anatomy. CONTRAST:  1104mL OMNIPAQUE IOHEXOL 350 MG/ML SOLN COMPARISON:  None. FINDINGS: Cardiovascular: Suboptimal opacification of the pulmonary arteries which are visualized to the lobar branches without evidence of pulmonary embolism. Normal heart size. No pericardial effusion. Thoracic aorta is normal in caliber. No thoracic aortic dissection. Mediastinum/Nodes: No enlarged mediastinal, hilar, or axillary lymph nodes. Thyroid gland, trachea, and esophagus demonstrate no significant findings. Lungs/Pleura: Lungs are clear. No pleural effusion or pneumothorax. Bibasilar atelectasis. Upper Abdomen: No acute abnormality. Musculoskeletal: No chest wall abnormality. No acute or significant osseous findings. Review of the MIP images confirms the above findings. IMPRESSION: 1. Suboptimal opacification of the pulmonary arteries which are visualized to the lobar branches without evidence of pulmonary embolism. 2. No acute cardiopulmonary disease. Electronically Signed   By: Kathreen Devoid   On: 12/19/2018 18:09  Dg Chest Portable 1 View  Result Date: 12/19/2018 CLINICAL DATA:  Chest pain EXAM: PORTABLE CHEST 1 VIEW COMPARISON:  12/30/2010 FINDINGS: The heart size and mediastinal contours are within normal limits. Both lungs are clear. The visualized skeletal structures are unremarkable. IMPRESSION: No active disease. Electronically Signed   By: Kathreen Devoid   On: 12/19/2018 14:59     Microbiology: Recent Results  (from the past 240 hour(s))  SARS Coronavirus 2 (CEPHEID - Performed in [place] hospital lab), Hosp Order     Status: None   Collection Time: 12/19/18  5:10 PM  Result Value Ref Range Status   SARS Coronavirus 2 NEGATIVE NEGATIVE Final    Comment: (NOTE) If result is NEGATIVE SARS-CoV-2 target nucleic acids are NOT DETECTED. The SARS-CoV-2 RNA is generally detectable in upper and lower  respiratory specimens during the acute phase of infection. The lowest  concentration of SARS-CoV-2 viral copies this assay can detect is 250  copies / mL. A negative result does not preclude SARS-CoV-2 infection  and should not be used as the sole basis for treatment or other  patient management decisions.  A negative result may occur with  improper specimen collection / handling, submission of specimen other  than nasopharyngeal swab, presence of viral mutation(s) within the  areas targeted by this assay, and inadequate number of viral copies  (<250 copies / mL). A negative result must be combined with clinical  observations, patient history, and epidemiological information. If result is POSITIVE SARS-CoV-2 target nucleic acids are DETECTED. The SARS-CoV-2 RNA is generally detectable in upper and lower  respiratory specimens dur ing the acute phase of infection.  Positive  results are indicative of active infection with SARS-CoV-2.  Clinical  correlation with patient history and other diagnostic information is  necessary to determine patient infection status.  Positive results do  not rule out bacterial infection or co-infection with other viruses. If result is PRESUMPTIVE POSTIVE SARS-CoV-2 nucleic acids MAY BE PRESENT.   A presumptive positive result was obtained on the submitted specimen  and confirmed on repeat testing.  While 2019 novel coronavirus  (SARS-CoV-2) nucleic acids may be present in the submitted sample  additional confirmatory testing may be necessary for epidemiological  and /  or clinical management purposes  to differentiate between  SARS-CoV-2 and other Sarbecovirus currently known to infect humans.  If clinically indicated additional testing with an alternate test  methodology [phone]) is advised. The SARS-CoV-2 RNA is generally  detectable in upper and lower respiratory sp ecimens during the acute  phase of infection. The expected result is Negative. Fact Sheet for Patients:  StrictlyIdeas.no Fact Sheet for Healthcare Providers: BankingDealers.co.za This test is not yet approved or cleared by the [place] FDA and has been authorized for detection and/or diagnosis of SARS-CoV-2 by FDA under an Emergency Use Authorization (EUA).  This EUA will remain in effect (meaning this test can be used) for the duration of the COVID-19 declaration under Section 564(b)(1) of the Act, 21 U.S.C. section 360bbb-3(b)(1), unless the authorization is terminated or revoked sooner. Performed at [facility], [address] [place], [place] [address]      Labs: Basic Metabolic Panel: Recent Labs  Lab 12/19/18 1448 12/19/18 1459 12/20/18 0520  NA 135 139 137  K 3.6 3.7 3.9  CL 102  --  104  CO2 23  --  26  GLUCOSE 92 86 92  BUN 10  --  10  CREATININE 0.93  --  0.93  CALCIUM 9.1  --  8.6*   Liver Function Tests: Recent Labs  Lab 12/20/18 0520  AST 22  ALT 22  ALKPHOS 45  BILITOT 1.0  PROT 6.9  ALBUMIN 3.1*   No results for input(s): LIPASE, AMYLASE in the last 168 hours. No results for input(s): AMMONIA in the last 168 hours. CBC: Recent Labs  Lab 12/19/18 1448 12/19/18 1459 12/20/18 0520  WBC 8.8  --  8.0  HGB 15.4 15.6 14.4  HCT 45.8 46.0 44.1  MCV 98.3  --  100.9*  PLT 225  --  209   Cardiac Enzymes: Recent Labs  Lab 12/19/18 1448 12/20/18 0856 12/20/18 1301  TROPONINI <0.03 <0.03 <0.03   BNP: Invalid input(s): POCBNP CBG: No results for input(s): GLUCAP in the last 168 hours.   Time coordinating discharge:  36 minutes  Signed:  Orson Eva, DO [facility] Pager: [phone] 12/20/2018, 3:18 PM

## 2018-12-21 LAB — HIV ANTIBODY (ROUTINE TESTING W REFLEX): HIV Screen 4th Generation wRfx: NONREACTIVE

## 2019-01-03 ENCOUNTER — Encounter: Payer: Self-pay | Admitting: Orthopedic Surgery

## 2019-01-03 ENCOUNTER — Other Ambulatory Visit: Payer: Self-pay

## 2019-01-03 ENCOUNTER — Ambulatory Visit (INDEPENDENT_AMBULATORY_CARE_PROVIDER_SITE_OTHER): Payer: Medicare Other | Admitting: Orthopedic Surgery

## 2019-01-03 VITALS — BP 110/85 | HR 99 | Temp 97.5°F | Ht 74.0 in | Wt 248.0 lb

## 2019-01-03 DIAGNOSIS — M25461 Effusion, right knee: Secondary | ICD-10-CM

## 2019-01-03 DIAGNOSIS — M10061 Idiopathic gout, right knee: Secondary | ICD-10-CM

## 2019-01-03 NOTE — Progress Notes (Signed)
  Chief Complaint  Patient presents with  . Knee Pain    Right knee pain and swelling for 59 days   58 year old male trying to delay knee surgery for total knee until July  Acute pain and swelling right knee x2 days history of gout on his medications which include prednisone and allopurinol and colchicine  Currently on oxycodone for chronic pain  Review of systems no fever  Physical Exam Vitals signs and nursing note reviewed.  Constitutional:      Appearance: Normal appearance.  Musculoskeletal:       Legs:  Neurological:     Mental Status: He is alert and oriented to person, place, and time.  Psychiatric:        Mood and Affect: Mood normal.     Procedure note injection and aspiration right knee joint  Verbal consent was obtained to aspirate and inject the right knee joint   Timeout was completed to confirm the site of aspiration and injection  An 18-gauge needle was used to aspirate the knee joint from a suprapatellar lateral approach.  The medications used were 40 mg of Depo-Medrol and 1% lidocaine 3 cc  Anesthesia was provided by ethyl chloride and the skin was prepped with alcohol.  After cleaning the skin with alcohol an 18-gauge needle was used to aspirate the right knee joint.  We obtained 75  cc of fluid cloudy thick yellow  We follow this by injection of 40 mg of Depo-Medrol and 3 cc 1% lidocaine.  There were no complications. A sterile bandage was applied.  Appropriate labs and studies sent  Call with results  Encounter Diagnoses  Name Primary?  . Acute idiopathic gout of right knee Yes  . Effusion of right knee joint

## 2019-01-08 ENCOUNTER — Telehealth: Payer: Self-pay | Admitting: Orthopedic Surgery

## 2019-01-08 ENCOUNTER — Telehealth: Payer: Self-pay | Admitting: Radiology

## 2019-01-08 ENCOUNTER — Other Ambulatory Visit: Payer: Self-pay | Admitting: Orthopedic Surgery

## 2019-01-08 LAB — WOUND CULTURE
MICRO NUMBER:: 500252
RESULT:: NO GROWTH
SPECIMEN QUALITY:: ADEQUATE

## 2019-01-08 LAB — SYNOVIAL CELL COUNT + DIFF, W/ CRYSTALS
Basophils, %: 0 %
Eosinophils-Synovial: 0 % (ref 0–2)
Lymphocytes-Synovial Fld: 4 % (ref 0–74)
Monocyte/Macrophage: 2 % (ref 0–69)
Neutrophil, Synovial: 93 % — ABNORMAL HIGH (ref 0–24)
Synoviocytes, %: 1 % (ref 0–15)
WBC, Synovial: 26660 cells/uL — ABNORMAL HIGH (ref <150)

## 2019-01-08 NOTE — Telephone Encounter (Signed)
Patient is asking for you to give him a return call.

## 2019-01-08 NOTE — Telephone Encounter (Signed)
He wants to schedule for July 28th, put in orders   Discussed the Oxycodone

## 2019-01-08 NOTE — Telephone Encounter (Signed)
-----   Message from Carole Civil, MD sent at 01/08/2019 12:04 PM EDT ----- Regarding: Schedule surgery Mr. Muckleroy would like to proceed with surgeryThe surgery can be done after June 22Tell him to take as little oxycodone as possible over the next month to allow him better pain control after surgery

## 2019-01-08 NOTE — Telephone Encounter (Signed)
Results called to Tyler Hart regarding his aspiration right knee date May 22  He had turbid fluid 26,000 white cells 93% neutrophils uric acid crystals fair clot reduced viscosity  He says that he cannot go on like this his knee is really bothering him  He is on an opioid and that needs to be tapered down prior to surgery  Surgery needs to be after June 22 secondary to the injection of cortisone he got on the 22nd

## 2019-01-14 NOTE — Telephone Encounter (Signed)
When we were talking last week, the phone cut out. 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

## 2019-01-21 NOTE — Telephone Encounter (Signed)
Patient returned call; asked for Amy to call him back at [phone], regarding surgery date discussed (03/11/19) and some other related information he would like to discuss.

## 2019-01-23 NOTE — Telephone Encounter (Signed)
I called him again. Left message for him to call me back.

## 2019-01-28 ENCOUNTER — Telehealth: Payer: Self-pay | Admitting: Orthopedic Surgery
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
## 2019-01-28 NOTE — Telephone Encounter (Signed)
Patient called to discuss surgery. Stated he can not do it on 7/28 and wants to see if he can schedule it in the last part of August or first of September.  Please call and advise

## 2019-02-01 ENCOUNTER — Encounter (HOSPITAL_COMMUNITY): Payer: Self-pay | Admitting: Emergency Medicine

## 2019-02-01 ENCOUNTER — Emergency Department (HOSPITAL_COMMUNITY)
Admission: EM | Admit: 2019-02-01 | Discharge: 2019-02-01 | Disposition: A | Discharge status: Home / Self Care | Payer: Medicare Other | Attending: Emergency Medicine | Admitting: Emergency Medicine

## 2019-02-01 ENCOUNTER — Other Ambulatory Visit: Payer: Self-pay

## 2019-02-01 DIAGNOSIS — I10 Essential (primary) hypertension: Secondary | ICD-10-CM | POA: Insufficient documentation

## 2019-02-01 DIAGNOSIS — M10061 Idiopathic gout, right knee: Secondary | ICD-10-CM | POA: Insufficient documentation

## 2019-02-01 DIAGNOSIS — Z79899 Other long term (current) drug therapy: Secondary | ICD-10-CM | POA: Insufficient documentation

## 2019-02-01 DIAGNOSIS — M25561 Pain in right knee: Secondary | ICD-10-CM | POA: Diagnosis present

## 2019-02-01 DIAGNOSIS — Z7982 Long term (current) use of aspirin: Secondary | ICD-10-CM | POA: Diagnosis not present

## 2019-02-01 MED ORDER — DEXAMETHASONE SODIUM PHOSPHATE 4 MG/ML IJ SOLN
10.0000 mg | Freq: Once | INTRAMUSCULAR | Status: AC
  Administered 2019-02-01: 10 mg via INTRAMUSCULAR
  Filled 2019-02-01: qty 3

## 2019-02-01 MED ORDER — MORPHINE SULFATE (PF) 4 MG/ML IV SOLN
8.0000 mg | Freq: Once | INTRAVENOUS | Status: AC
  Administered 2019-02-01: 10:00:00 8 mg via INTRAMUSCULAR
  Filled 2019-02-01: qty 2

## 2019-02-01 NOTE — Discharge Instructions (Addendum)
Call Dr. Ruthe Mannan office to arrange a follow-up appt.  Continue to take your colchicine as directed.

## 2019-02-01 NOTE — ED Triage Notes (Signed)
Pt reports having fluid drawn off of right knee 2 weeks ago by Dr. Aline Brochure. Was told it was gout. Knee continues to swell with pain.
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
## 2019-02-13 ENCOUNTER — Other Ambulatory Visit: Payer: Self-pay

## 2019-02-13 ENCOUNTER — Emergency Department (HOSPITAL_COMMUNITY)
Admission: EM | Admit: 2019-02-13 | Discharge: 2019-02-13 | Disposition: A | Discharge status: Home / Self Care | Payer: Medicare Other | Attending: Emergency Medicine | Admitting: Emergency Medicine

## 2019-02-13 ENCOUNTER — Encounter (HOSPITAL_COMMUNITY): Payer: Self-pay

## 2019-02-13 DIAGNOSIS — M10011 Idiopathic gout, right shoulder: Secondary | ICD-10-CM | POA: Insufficient documentation

## 2019-02-13 DIAGNOSIS — Z96652 Presence of left artificial knee joint: Secondary | ICD-10-CM | POA: Diagnosis not present

## 2019-02-13 DIAGNOSIS — Z79899 Other long term (current) drug therapy: Secondary | ICD-10-CM | POA: Diagnosis not present

## 2019-02-13 DIAGNOSIS — M25511 Pain in right shoulder: Secondary | ICD-10-CM | POA: Diagnosis not present

## 2019-02-13 DIAGNOSIS — M25561 Pain in right knee: Secondary | ICD-10-CM | POA: Diagnosis not present

## 2019-02-13 DIAGNOSIS — I10 Essential (primary) hypertension: Secondary | ICD-10-CM | POA: Insufficient documentation

## 2019-02-13 DIAGNOSIS — M1000 Idiopathic gout, unspecified site: Secondary | ICD-10-CM

## 2019-02-13 DIAGNOSIS — M10071 Idiopathic gout, right ankle and foot: Secondary | ICD-10-CM | POA: Insufficient documentation

## 2019-02-13 DIAGNOSIS — M10061 Idiopathic gout, right knee: Secondary | ICD-10-CM | POA: Insufficient documentation

## 2019-02-13 DIAGNOSIS — M25521 Pain in right elbow: Secondary | ICD-10-CM | POA: Insufficient documentation

## 2019-02-13 DIAGNOSIS — Z7982 Long term (current) use of aspirin: Secondary | ICD-10-CM | POA: Insufficient documentation

## 2019-02-13 DIAGNOSIS — F1490 Cocaine use, unspecified, uncomplicated: Secondary | ICD-10-CM | POA: Diagnosis not present

## 2019-02-13 DIAGNOSIS — M10021 Idiopathic gout, right elbow: Secondary | ICD-10-CM | POA: Diagnosis not present

## 2019-02-13 DIAGNOSIS — M25571 Pain in right ankle and joints of right foot: Secondary | ICD-10-CM | POA: Diagnosis present

## 2019-02-13 MED ORDER — DICLOFENAC SODIUM 75 MG PO TBEC: 75.0000 mg | DELAYED_RELEASE_TABLET | Freq: Two times a day (BID) | ORAL | 0 refills | Status: DC

## 2019-02-13 MED ORDER — DEXAMETHASONE 4 MG PO TABS: 4.0000 mg | ORAL_TABLET | Freq: Two times a day (BID) | ORAL | 0 refills | Status: DC

## 2019-02-13 MED ORDER — DEXAMETHASONE SODIUM PHOSPHATE 10 MG/ML IJ SOLN
10.0000 mg | Freq: Once | INTRAMUSCULAR | Status: AC
  Administered 2019-02-13: 10 mg via INTRAMUSCULAR
  Filled 2019-02-13: qty 1

## 2019-02-13 MED ORDER — HYDROCODONE-ACETAMINOPHEN 5-325 MG PO TABS: 1.0000 | ORAL_TABLET | ORAL | 0 refills | Status: DC | PRN

## 2019-02-13 MED ORDER — HYDROCODONE-ACETAMINOPHEN 5-325 MG PO TABS
2.0000 | ORAL_TABLET | Freq: Once | ORAL | Status: AC
  Administered 2019-02-13: 2 via ORAL
  Filled 2019-02-13: qty 2

## 2019-02-13 MED ORDER — COLCHICINE 0.6 MG PO TABS
1.2000 mg | ORAL_TABLET | Freq: Once | ORAL | Status: AC
  Administered 2019-02-13: 1.2 mg via ORAL
  Filled 2019-02-13: qty 2

## 2019-02-13 MED ORDER — ONDANSETRON HCL 4 MG PO TABS
4.0000 mg | ORAL_TABLET | Freq: Once | ORAL | Status: AC
  Administered 2019-02-13: 4 mg via ORAL
  Filled 2019-02-13: qty 1
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
## 2019-02-13 NOTE — Discharge Instructions (Addendum)
Your blood pressure is slightly elevated at 156/97, please have this rechecked soon.  Your examination favors an exacerbation of your gout.  Please add the Decadron, diclofenac, and hydrocodone to your current regimen.  Please discuss this with Dr. Legrand Rams for additional pain management plans.

## 2019-02-13 NOTE — ED Notes (Signed)
Into pt's room to get discharge vitals and signature. Pt sitting at bedside with rolled up papers in hand. States "get me a wheelchair, I"m leaving". RN states that she needs to get vital signs and have pt sign out, pt states "I have already done that and I am leaving now". Pt taken to vehicle in wheelchair by tech. No vital signs or signature noted. Pt had called out to desk several times cursing. Unknown pain level at discharge so documented as previously noted pain level.

## 2019-02-13 NOTE — ED Triage Notes (Signed)
Pt is having a gout flare up. Was seen here 3 weeks ago for same. Right foot, knee, and shoulder pain. Takes medication everyday for gout.

## 2019-03-11 ENCOUNTER — Inpatient Hospital Stay: Admit: 2019-03-11 | Payer: Medicare Other | Admitting: Orthopedic Surgery

## 2019-03-11 SURGERY — ARTHROPLASTY, KNEE, TOTAL
Anesthesia: Choice | Laterality: Right

## 2019-03-18 ENCOUNTER — Emergency Department (HOSPITAL_COMMUNITY)
Admission: EM | Admit: 2019-03-18 | Discharge: 2019-03-18 | Disposition: A | Discharge status: Home / Self Care | Payer: Medicare Other | Attending: Emergency Medicine | Admitting: Emergency Medicine

## 2019-03-18 ENCOUNTER — Encounter (HOSPITAL_COMMUNITY): Payer: Self-pay

## 2019-03-18 ENCOUNTER — Other Ambulatory Visit: Payer: Self-pay

## 2019-03-18 DIAGNOSIS — M25572 Pain in left ankle and joints of left foot: Secondary | ICD-10-CM | POA: Diagnosis not present

## 2019-03-18 DIAGNOSIS — I10 Essential (primary) hypertension: Secondary | ICD-10-CM | POA: Insufficient documentation

## 2019-03-18 DIAGNOSIS — M25561 Pain in right knee: Secondary | ICD-10-CM | POA: Diagnosis present

## 2019-03-18 DIAGNOSIS — Z7982 Long term (current) use of aspirin: Secondary | ICD-10-CM | POA: Diagnosis not present

## 2019-03-18 DIAGNOSIS — Z79899 Other long term (current) drug therapy: Secondary | ICD-10-CM | POA: Insufficient documentation

## 2019-03-18 DIAGNOSIS — M1000 Idiopathic gout, unspecified site: Secondary | ICD-10-CM | POA: Diagnosis not present

## 2019-03-18 MED ORDER — HYDROCODONE-ACETAMINOPHEN 5-325 MG PO TABS
2.0000 | ORAL_TABLET | Freq: Once | ORAL | Status: AC
  Administered 2019-03-18: 2 via ORAL
  Filled 2019-03-18: qty 2

## 2019-03-18 MED ORDER — DICLOFENAC SODIUM 75 MG PO TBEC: 75.0000 mg | DELAYED_RELEASE_TABLET | Freq: Two times a day (BID) | ORAL | 0 refills | Status: DC

## 2019-03-18 MED ORDER — DEXAMETHASONE 4 MG PO TABS: 4.0000 mg | ORAL_TABLET | Freq: Two times a day (BID) | ORAL | 0 refills | Status: DC

## 2019-03-18 MED ORDER — KETOROLAC TROMETHAMINE 30 MG/ML IJ SOLN
30.0000 mg | Freq: Once | INTRAMUSCULAR | Status: AC
  Administered 2019-03-18: 30 mg via INTRAMUSCULAR
  Filled 2019-03-18: qty 1

## 2019-03-18 MED ORDER — DEXAMETHASONE SODIUM PHOSPHATE 10 MG/ML IJ SOLN
10.0000 mg | Freq: Once | INTRAMUSCULAR | Status: AC
  Administered 2019-03-18: 10 mg via INTRAMUSCULAR
  Filled 2019-03-18: qty 1

## 2019-03-18 MED ORDER — ONDANSETRON HCL 4 MG PO TABS
4.0000 mg | ORAL_TABLET | Freq: Once | ORAL | Status: AC
  Administered 2019-03-18: 4 mg via ORAL
  Filled 2019-03-18: qty 1
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
## 2019-03-18 NOTE — ED Triage Notes (Signed)
Pt presents to ED with complaints of right knee pain, and left ankle pain which started last night. Pt with history of gout. Pt with NKI.

## 2019-03-18 NOTE — Discharge Instructions (Addendum)
You were treated in the emergency department with narcotic pain medication, steroid injection, and anti-inflammatory injection.  Please use caution getting around as the narcotic may cause drowsiness, and or lightheadedness.  Please continue your colchicine.  Please add the Decadron in the diclofenac to your current regiment.  Please notify Dr. Legrand Rams of your visit today, and discuss breakthrough pain management with Dr. Legrand Rams.

## 2019-04-18 ENCOUNTER — Encounter (HOSPITAL_COMMUNITY): Payer: Self-pay

## 2019-04-18 ENCOUNTER — Emergency Department (HOSPITAL_COMMUNITY)
Admission: EM | Admit: 2019-04-18 | Discharge: 2019-04-18 | Disposition: A | Discharge status: Home / Self Care | Payer: Medicare Other | Attending: Emergency Medicine | Admitting: Emergency Medicine

## 2019-04-18 ENCOUNTER — Other Ambulatory Visit: Payer: Self-pay

## 2019-04-18 DIAGNOSIS — M109 Gout, unspecified: Secondary | ICD-10-CM | POA: Diagnosis not present

## 2019-04-18 DIAGNOSIS — Z7982 Long term (current) use of aspirin: Secondary | ICD-10-CM | POA: Insufficient documentation

## 2019-04-18 DIAGNOSIS — M25561 Pain in right knee: Secondary | ICD-10-CM | POA: Diagnosis present

## 2019-04-18 DIAGNOSIS — Z79899 Other long term (current) drug therapy: Secondary | ICD-10-CM | POA: Diagnosis not present

## 2019-04-18 DIAGNOSIS — M25571 Pain in right ankle and joints of right foot: Secondary | ICD-10-CM | POA: Insufficient documentation

## 2019-04-18 DIAGNOSIS — I10 Essential (primary) hypertension: Secondary | ICD-10-CM | POA: Diagnosis not present

## 2019-04-18 MED ORDER — DICLOFENAC SODIUM 75 MG PO TBEC: 75.0000 mg | DELAYED_RELEASE_TABLET | Freq: Two times a day (BID) | ORAL | 0 refills | Status: DC

## 2019-04-18 MED ORDER — DEXAMETHASONE SODIUM PHOSPHATE 10 MG/ML IJ SOLN
8.0000 mg | Freq: Once | INTRAMUSCULAR | Status: AC
  Administered 2019-04-18: 8 mg via INTRAMUSCULAR
  Filled 2019-04-18: qty 1

## 2019-04-18 MED ORDER — OXYCODONE-ACETAMINOPHEN 5-325 MG PO TABS
1.0000 | ORAL_TABLET | Freq: Once | ORAL | Status: AC
  Administered 2019-04-18: 13:00:00 1 via ORAL
  Filled 2019-04-18: qty 1
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
## 2019-04-18 NOTE — Discharge Instructions (Signed)
You can continue taking your previously prescribed pain medication.  Take Voltaren as directed.   Follow the RICE (Rest, Ice, Compression, Elevation) protocol as directed.   Follow up with Dr. Aline Brochure.   Return to the Emergency Dept for any fevers, numbness/weakness, worsening redness, swelling or any other worsening or concerning symptoms.

## 2019-04-18 NOTE — ED Triage Notes (Signed)
Pt presents to ED with right foot and right knee pain which started last night. Pt with hx of gout.

## 2019-05-14 ENCOUNTER — Encounter: Payer: Self-pay | Admitting: Gastroenterology

## 2019-05-16 ENCOUNTER — Emergency Department (HOSPITAL_COMMUNITY)
Admission: EM | Admit: 2019-05-16 | Discharge: 2019-05-16 | Disposition: A | Discharge status: Home / Self Care | Payer: Medicare Other | Attending: Emergency Medicine | Admitting: Emergency Medicine

## 2019-05-16 ENCOUNTER — Other Ambulatory Visit: Payer: Self-pay

## 2019-05-16 ENCOUNTER — Encounter (HOSPITAL_COMMUNITY): Payer: Self-pay | Admitting: Emergency Medicine

## 2019-05-16 DIAGNOSIS — M25561 Pain in right knee: Secondary | ICD-10-CM

## 2019-05-16 DIAGNOSIS — Z7982 Long term (current) use of aspirin: Secondary | ICD-10-CM | POA: Diagnosis not present

## 2019-05-16 DIAGNOSIS — I10 Essential (primary) hypertension: Secondary | ICD-10-CM | POA: Diagnosis not present

## 2019-05-16 DIAGNOSIS — Z79899 Other long term (current) drug therapy: Secondary | ICD-10-CM | POA: Diagnosis not present

## 2019-05-16 MED ORDER — DEXAMETHASONE 4 MG PO TABS: 4.0000 mg | ORAL_TABLET | Freq: Two times a day (BID) | ORAL | 0 refills | Status: DC

## 2019-05-16 MED ORDER — DEXAMETHASONE SODIUM PHOSPHATE 10 MG/ML IJ SOLN
8.0000 mg | Freq: Once | INTRAMUSCULAR | Status: AC
  Administered 2019-05-16: 8 mg via INTRAMUSCULAR
  Filled 2019-05-16: qty 1

## 2019-05-16 MED ORDER — OXYCODONE-ACETAMINOPHEN 5-325 MG PO TABS
1.0000 | ORAL_TABLET | Freq: Once | ORAL | Status: AC
  Administered 2019-05-16: 1 via ORAL
  Filled 2019-05-16: qty 1

## 2019-05-16 MED ORDER — DICLOFENAC SODIUM 50 MG PO TBEC: 50.0000 mg | DELAYED_RELEASE_TABLET | Freq: Two times a day (BID) | ORAL | 0 refills | Status: DC

## 2019-05-16 NOTE — Discharge Instructions (Signed)
You were seen in the emergency department for right knee pain.  This is possibly gout although more likely is just an arthritis flare.  We gave you a dose of steroids and you should continue anti-inflammatories.  Please follow-up with your primary care doctor and your orthopedic surgeon.  Return if any fever or worsening symptoms.
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
## 2019-05-16 NOTE — ED Triage Notes (Signed)
Pt c/o right knee pain, stating that "its the gout, I get it every month."

## 2019-06-24 IMAGING — US US ABDOMEN LIMITED
1 series · 14 of 25 positions shown · non-contrast
Comparison: None.

CLINICAL DATA: Elevated liver enzymes.  History of hepatitis-C

EXAM:
ULTRASOUND ABDOMEN LIMITED RIGHT UPPER QUADRANT

[Series 2001: us abdomen limited · 0.19mm/px · 14 of 91 slices shown]
[im 1/91]
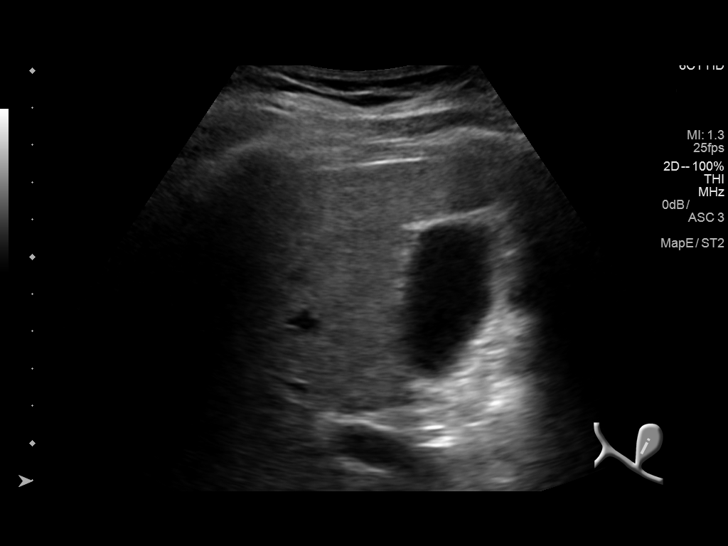
[im 8/91]
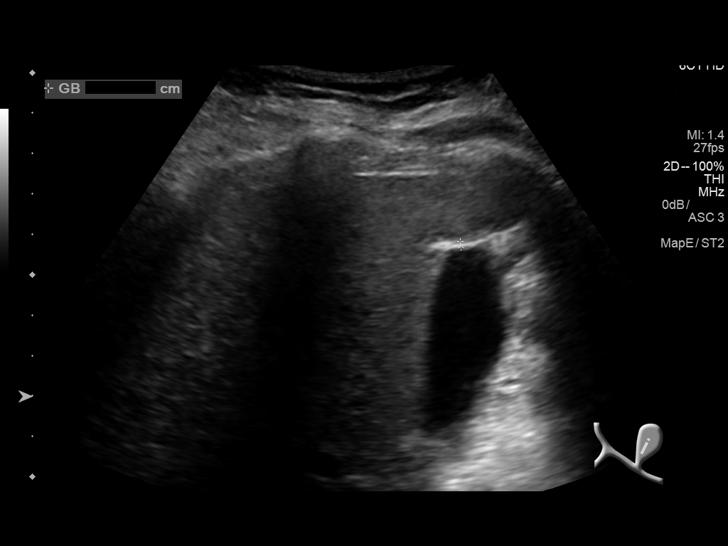
[im 16/91]
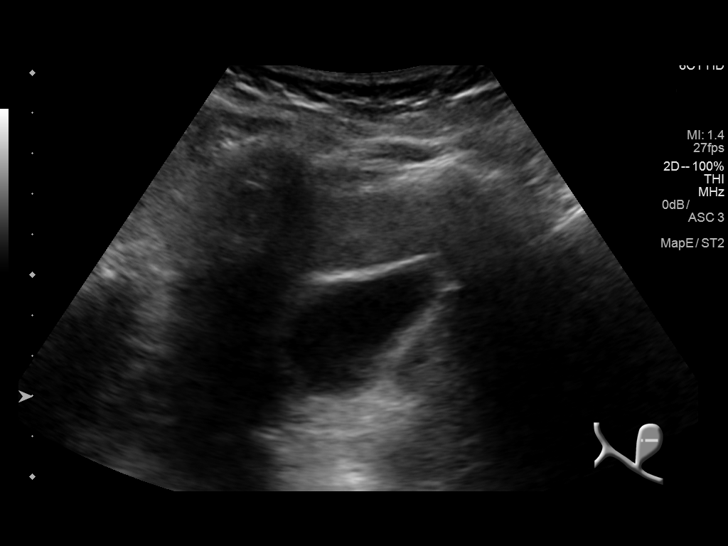
[im 23/91]
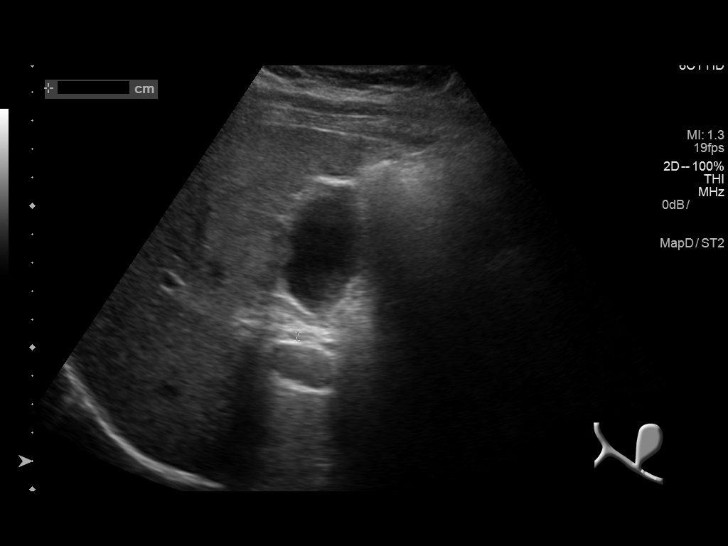
[im 31/91]
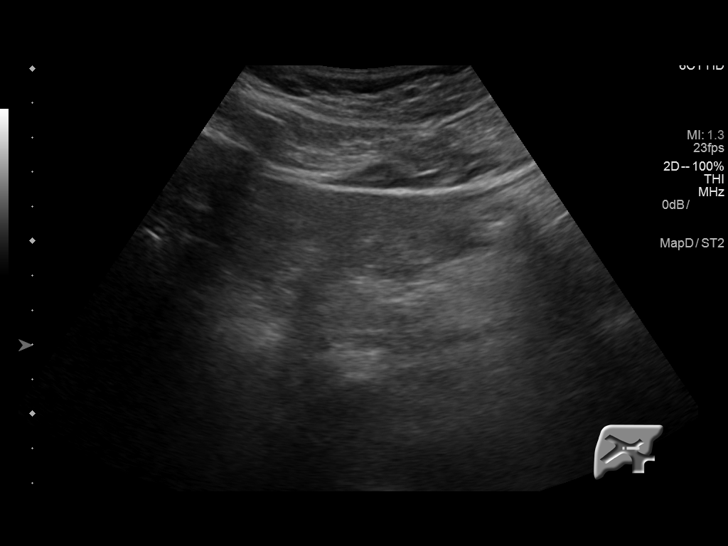
[im 34/91]
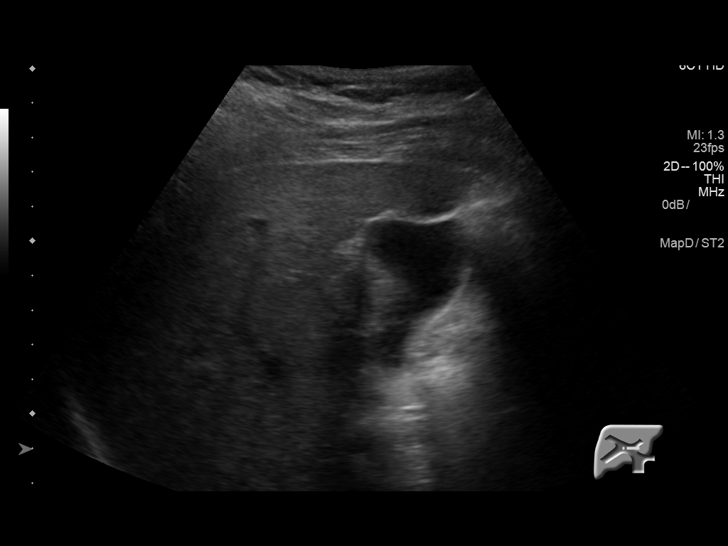
[im 42/91]
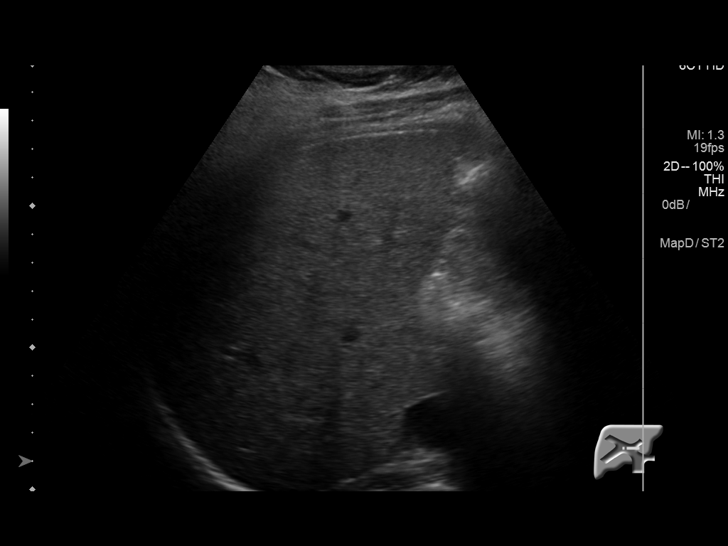
[im 49/91]
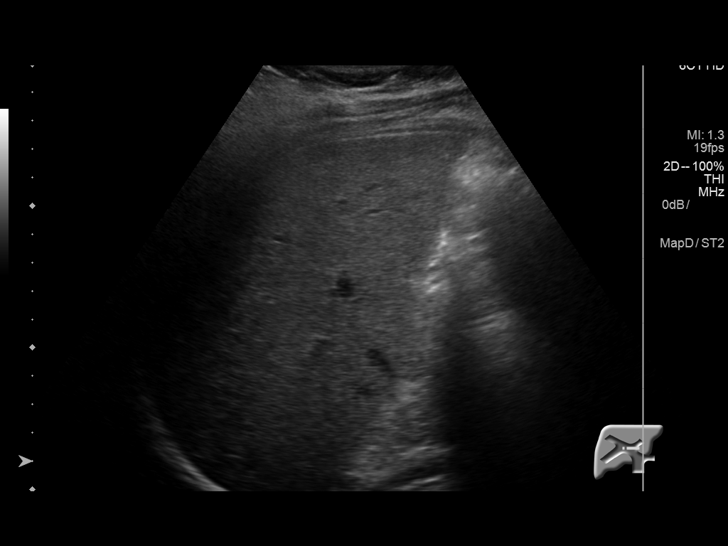
[im 57/91]
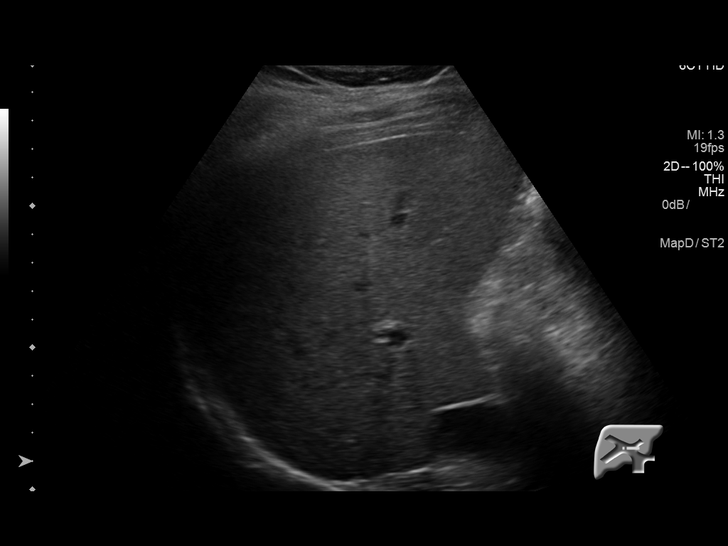
[im 61/91]
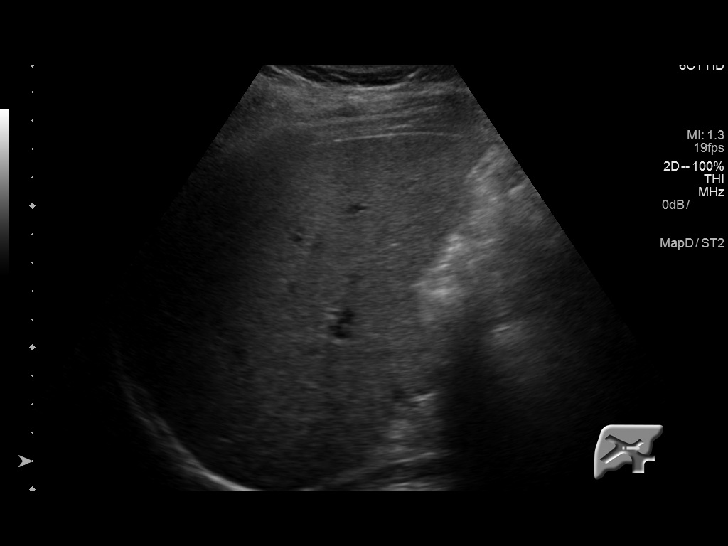
[im 68/91]
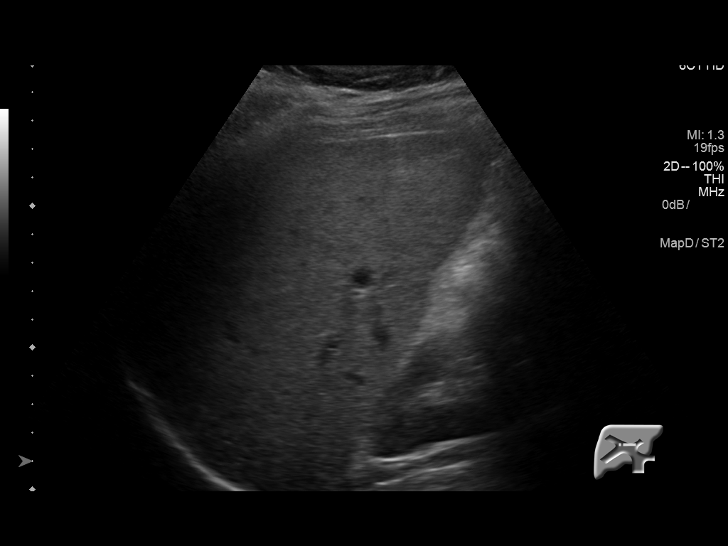
[im 76/91]
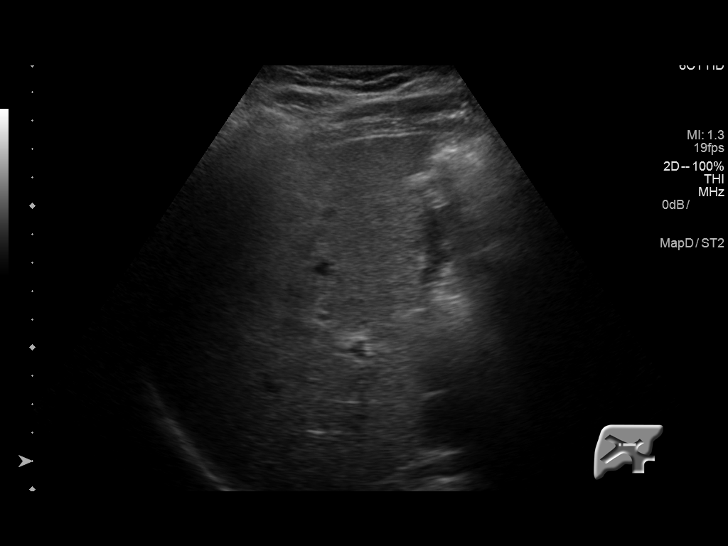
[im 83/91]
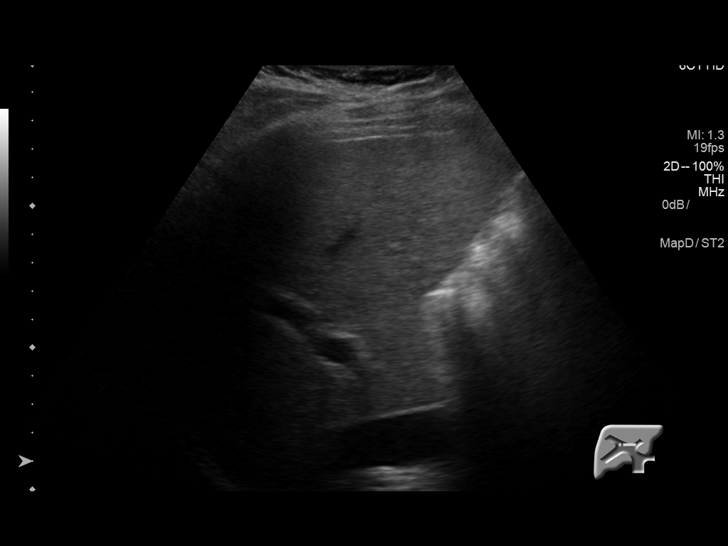
[im 91/91]
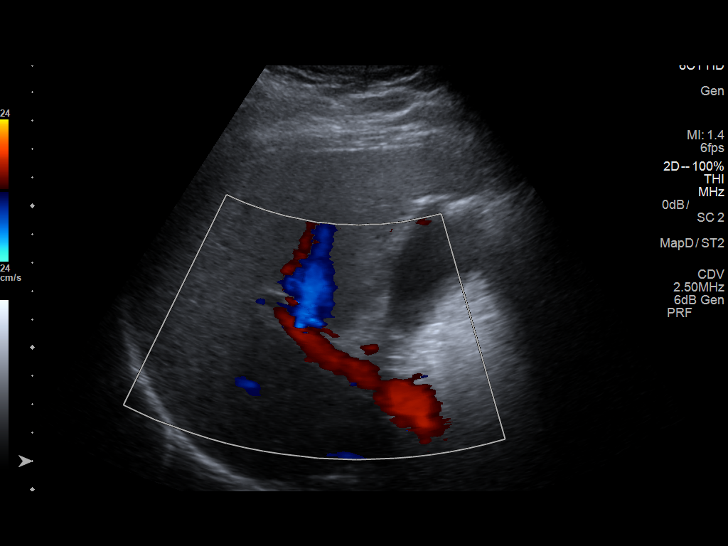

[14 of 25 positions shown; findings below may reference images not displayed]

FINDINGS: Gallbladder:

No gallstones or wall thickening visualized. There is no
pericholecystic fluid. No sonographic Murphy sign noted by
sonographer.

Common bile duct:

Diameter: 3 mm. No intrahepatic or extrahepatic biliary duct
dilatation.

Liver:

No focal lesion identified. Within normal limits in parenchymal
echogenicity. Portal vein is patent on color Doppler imaging with
normal direction of blood flow towards the liver.
IMPRESSION: Study within normal limits.

## 2019-10-20 ENCOUNTER — Emergency Department (HOSPITAL_COMMUNITY)
Admission: EM | Admit: 2019-10-20 | Discharge: 2019-10-20 | Disposition: A | Discharge status: Home / Self Care | Payer: Medicare Other | Attending: Emergency Medicine | Admitting: Emergency Medicine

## 2019-10-20 ENCOUNTER — Encounter (HOSPITAL_COMMUNITY): Payer: Self-pay | Admitting: Emergency Medicine

## 2019-10-20 ENCOUNTER — Other Ambulatory Visit: Payer: Self-pay

## 2019-10-20 DIAGNOSIS — M79642 Pain in left hand: Secondary | ICD-10-CM | POA: Diagnosis not present

## 2019-10-20 DIAGNOSIS — Z79899 Other long term (current) drug therapy: Secondary | ICD-10-CM | POA: Diagnosis not present

## 2019-10-20 DIAGNOSIS — M2550 Pain in unspecified joint: Secondary | ICD-10-CM

## 2019-10-20 DIAGNOSIS — M25571 Pain in right ankle and joints of right foot: Secondary | ICD-10-CM | POA: Diagnosis not present

## 2019-10-20 DIAGNOSIS — M25561 Pain in right knee: Secondary | ICD-10-CM | POA: Diagnosis present

## 2019-10-20 DIAGNOSIS — Z7982 Long term (current) use of aspirin: Secondary | ICD-10-CM | POA: Diagnosis not present

## 2019-10-20 DIAGNOSIS — M130 Polyarthritis, unspecified: Secondary | ICD-10-CM | POA: Diagnosis not present

## 2019-10-20 DIAGNOSIS — I10 Essential (primary) hypertension: Secondary | ICD-10-CM | POA: Insufficient documentation

## 2019-10-20 MED ORDER — IBUPROFEN-FAMOTIDINE 800-26.6 MG PO TABS: 1.0000 | ORAL_TABLET | Freq: Three times a day (TID) | ORAL | 0 refills | Status: AC | PRN

## 2019-10-20 MED ORDER — DEXAMETHASONE SODIUM PHOSPHATE 10 MG/ML IJ SOLN
10.0000 mg | Freq: Once | INTRAMUSCULAR | Status: AC
  Administered 2019-10-20: 09:00:00 10 mg via INTRAMUSCULAR
  Filled 2019-10-20: qty 1
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
## 2019-10-20 NOTE — Discharge Instructions (Addendum)
Take Duexis as prescribed. This medication is a pain medication (iduprofen) with Pepcid (stomach pill)- due to history of h. Pylori.  Follow up with your PCP today as planned for further care of your gout/joint pain.

## 2019-10-20 NOTE — ED Triage Notes (Signed)
Left hand and right knee pain since last night. Ran out of gout medication

## 2020-05-10 ENCOUNTER — Encounter: Payer: Self-pay | Admitting: Orthopedic Surgery

## 2020-05-10 ENCOUNTER — Ambulatory Visit (INDEPENDENT_AMBULATORY_CARE_PROVIDER_SITE_OTHER): Payer: Medicare Other | Admitting: Orthopedic Surgery

## 2020-05-10 ENCOUNTER — Ambulatory Visit: Payer: Medicare Other

## 2020-05-10 ENCOUNTER — Other Ambulatory Visit: Payer: Self-pay

## 2020-05-10 VITALS — BP 147/95 | HR 81 | Ht 74.0 in | Wt 255.0 lb

## 2020-05-10 DIAGNOSIS — M25511 Pain in right shoulder: Secondary | ICD-10-CM

## 2020-05-10 DIAGNOSIS — G8929 Other chronic pain: Secondary | ICD-10-CM
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
## 2020-05-10 NOTE — Patient Instructions (Signed)
Do exercises daily for 2 months   Stop diclofenac  You have received an injection of steroids into the joint. 15% of patients will have increased pain within the 24 hours postinjection.   This is transient and will go away.   We recommend that you use ice packs on the injection site for 20 minutes every 2 hours and extra strength Tylenol 2 tablets every 8 as needed until the pain resolves.  If you continue to have pain after taking the Tylenol and using the ice please call the office for further instructions.

## 2020-07-01 ENCOUNTER — Ambulatory Visit: Payer: Medicare Other | Admitting: Orthopedic Surgery

## 2020-07-26 ENCOUNTER — Other Ambulatory Visit: Payer: Self-pay

## 2020-07-26 ENCOUNTER — Encounter: Payer: Self-pay | Admitting: Orthopedic Surgery

## 2020-07-26 ENCOUNTER — Ambulatory Visit (INDEPENDENT_AMBULATORY_CARE_PROVIDER_SITE_OTHER): Payer: Medicare Other | Admitting: Orthopedic Surgery

## 2020-07-26 VITALS — BP 126/87 | HR 88 | Ht 74.0 in | Wt 256.0 lb

## 2020-07-26 DIAGNOSIS — G8929 Other chronic pain: Secondary | ICD-10-CM

## 2020-07-26 DIAGNOSIS — S46011D Strain of muscle(s) and tendon(s) of the rotator cuff of right shoulder, subsequent encounter: Secondary | ICD-10-CM

## 2020-07-26 NOTE — Patient Instructions (Signed)
Schedule mri rt shoulder

## 2020-07-26 NOTE — Progress Notes (Signed)
Chief Complaint  Patient presents with  . Shoulder Pain    Right shoulder, pain to move. All the time.     Tyler Hart follows up for his right shoulder he tried to pick up the gentleman who had fallen and injured it several months ago  He did have an injection he is on meloxicam he tried some oral pain medications did not improve complains of lateral deltoid pain weakness and loss of motion  X-ray does not show any fracture  Based on his weakness loss of motion and pain along the deltoid recommend MRI right shoulder  Encounter Diagnoses  Name Primary?  . Chronic right shoulder pain Yes  . Traumatic complete tear of right rotator cuff, subsequent encounter     Chronic, order MRI may need surgery

## 2020-08-09 ENCOUNTER — Ambulatory Visit (HOSPITAL_COMMUNITY)
Admission: RE | Admit: 2020-08-09 | Discharge: 2020-08-09 | Disposition: A | Discharge status: Home / Self Care | Payer: Medicare Other | Source: Ambulatory Visit | Attending: Orthopedic Surgery | Admitting: Orthopedic Surgery

## 2020-08-09 ENCOUNTER — Other Ambulatory Visit: Payer: Self-pay

## 2020-08-09 DIAGNOSIS — M25511 Pain in right shoulder: Secondary | ICD-10-CM | POA: Diagnosis present

## 2020-08-09 DIAGNOSIS — G8929 Other chronic pain: Secondary | ICD-10-CM | POA: Diagnosis present

## 2020-08-09 IMAGING — MR MR SHOULDER*R* W/O CM
7 series · 40 of 40 positions shown · non-contrast
Comparison: Radiographs [DATE]

CLINICAL DATA: Right shoulder pain for 9 months. Rotator cuff
disorder suspected. No acute injury or prior relevant surgery.

EXAM:
MRI OF THE RIGHT SHOULDER WITHOUT CONTRAST
TECHNIQUE: Multiplanar, multisequence MR imaging of the shoulder was performed.
No intravenous contrast was administered.

[Series 5: T2 fat-sat · axial · right · 4.0mm · 0.47mm/px · z∈[-66,+52]mm · 5 of 26 slices shown (1 of 5)]
[im 1/26]
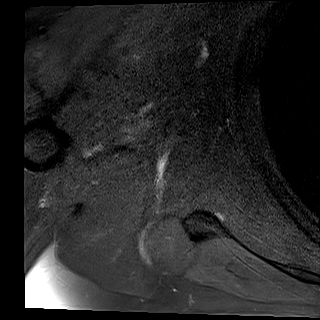
[im 7/26]
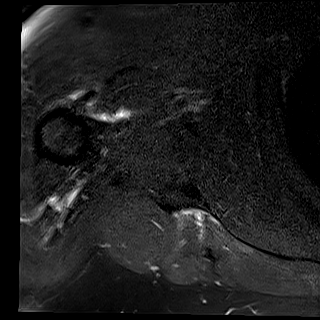
[im 13/26]
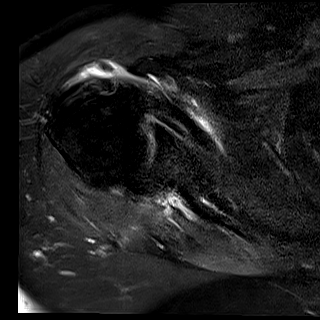
[im 19/26]
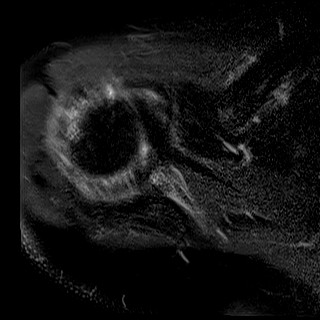
[im 26/26]
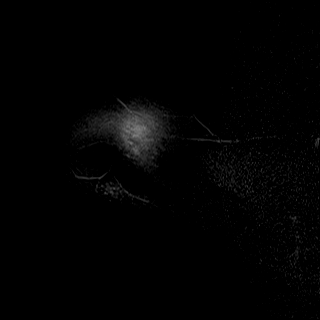

[Series 6: T2 fat-sat · oblique · right · 4.0mm · 0.47mm/px · 5 of 26 slices shown (2 of 5)]
[im 1/26]
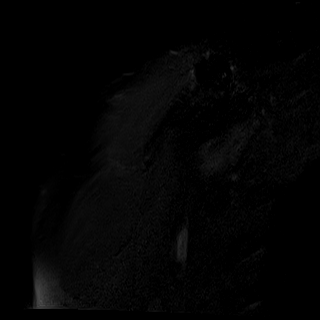
[im 7/26]
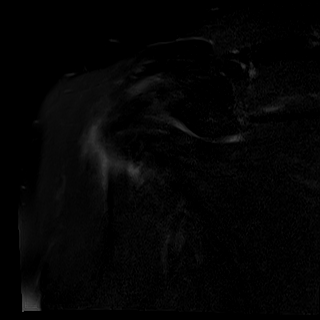
[im 13/26]
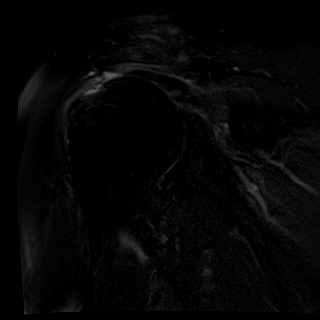
[im 19/26]
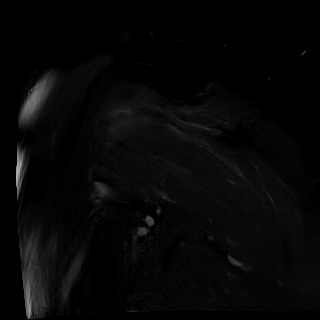
[im 26/26]
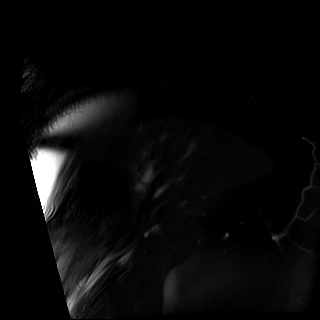

[Series 7: PD · oblique · right · 4.0mm · 0.47mm/px · 6 of 26 slices shown]
[im 1/26]
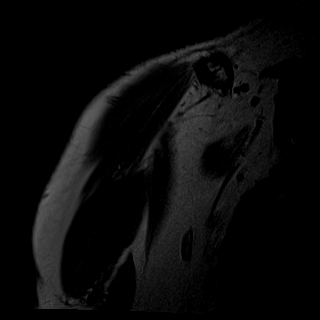
[im 6/26]
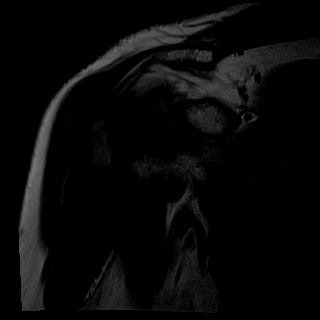
[im 11/26]
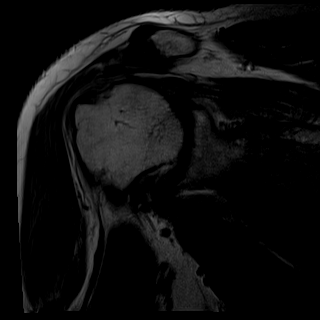
[im 16/26]
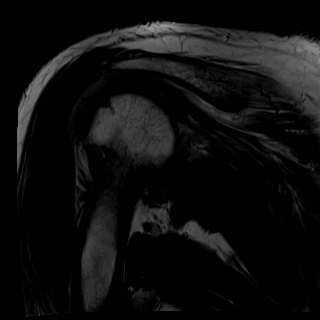
[im 21/26]
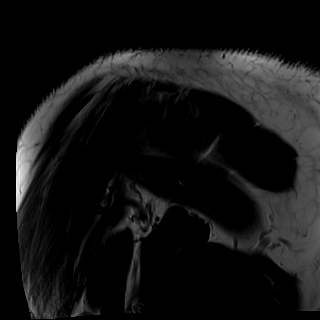
[im 26/26]
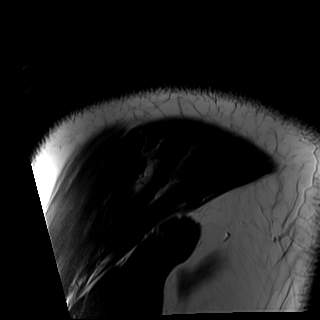

[Series 8: T2 fat-sat · sagittal · right · 4.0mm · 0.47mm/px · 6 of 25 slices shown (3 of 5)]
[im 1/25]
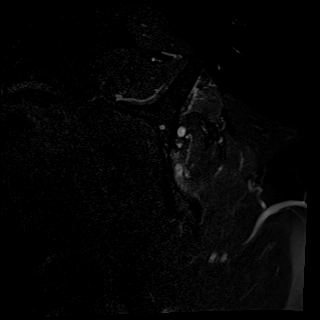
[im 5/25]
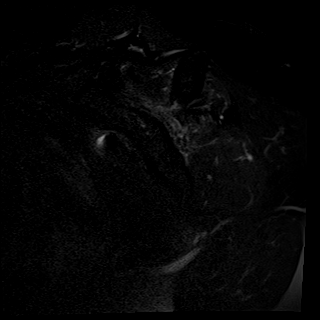
[im 10/25]
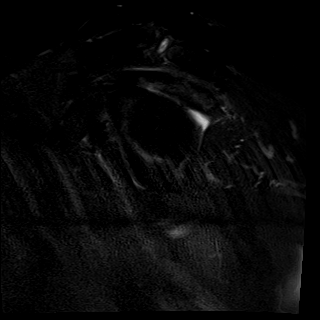
[im 15/25]
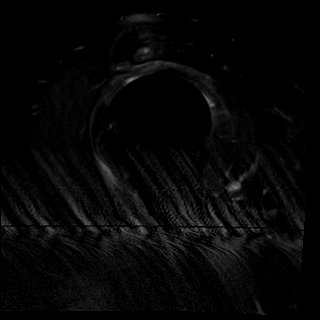
[im 20/25]
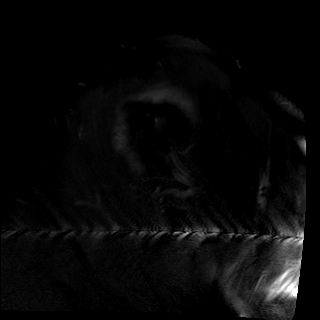
[im 25/25]
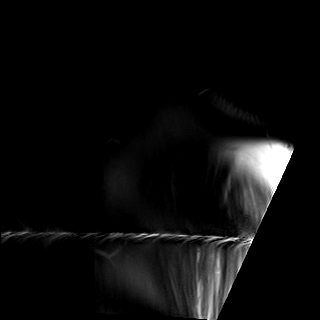

[Series 9: T1 · sagittal · right · 4.0mm · 0.41mm/px · 6 of 25 slices shown]
[im 1/25]
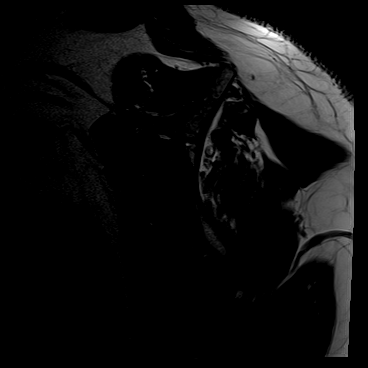
[im 5/25]
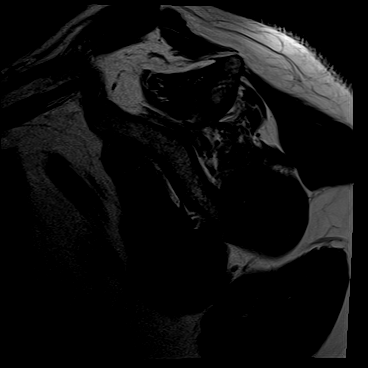
[im 10/25]
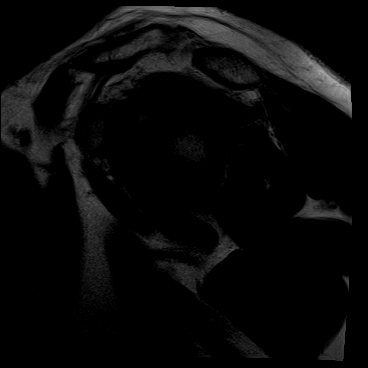
[im 15/25]
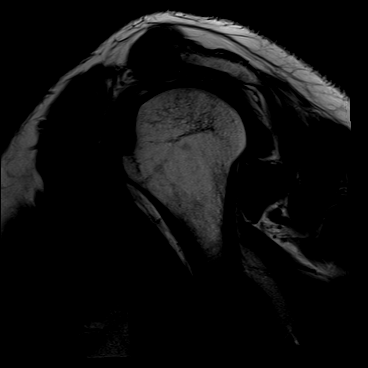
[im 20/25]
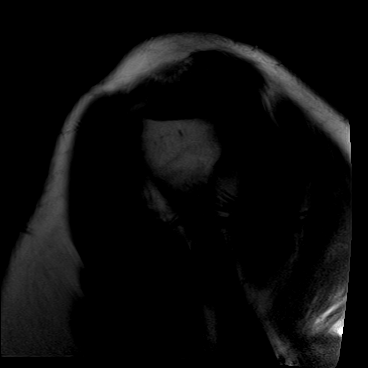
[im 25/25]
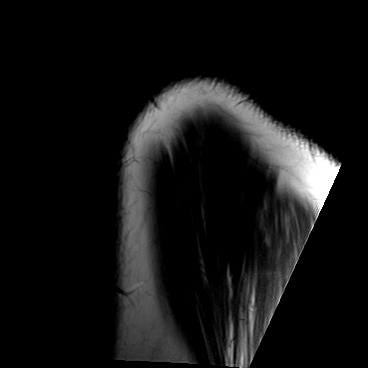

[Series 10: T2 fat-sat · axial · right · 4.0mm · 0.47mm/px · z∈[-66,+52]mm · 6 of 26 slices shown (4 of 5)]
[im 1/26]
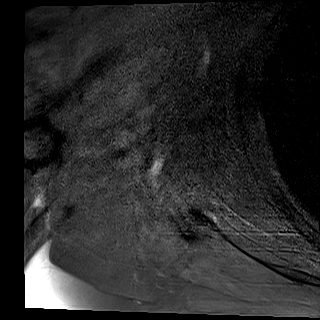
[im 6/26]
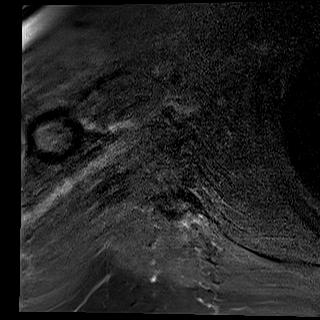
[im 11/26]
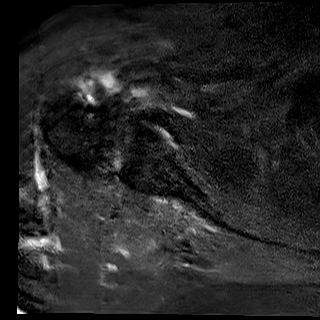
[im 16/26]
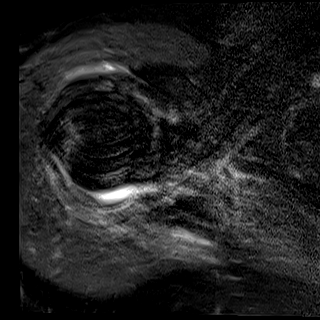
[im 21/26]
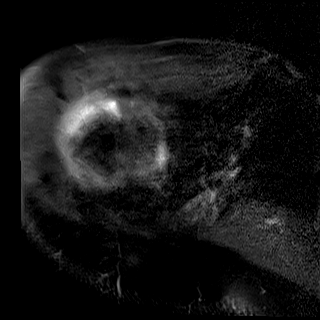
[im 26/26]
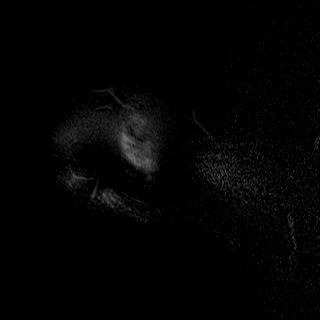

[Series 11: T2 fat-sat · sagittal · right · 4.0mm · 0.47mm/px · 6 of 25 slices shown (5 of 5)]
[im 1/25]
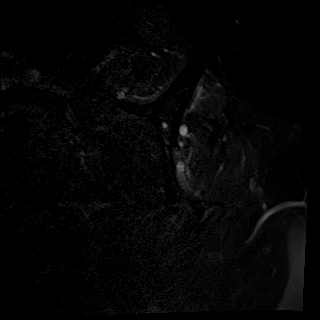
[im 5/25]
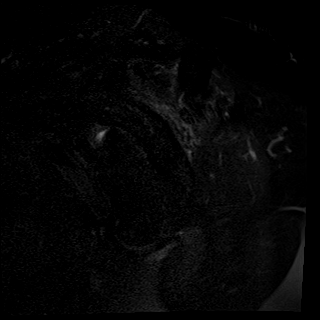
[im 10/25]
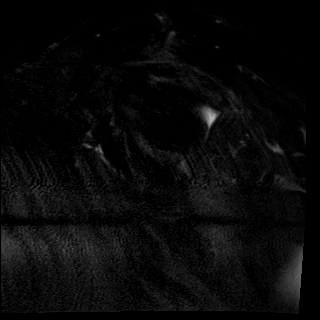
[im 15/25]
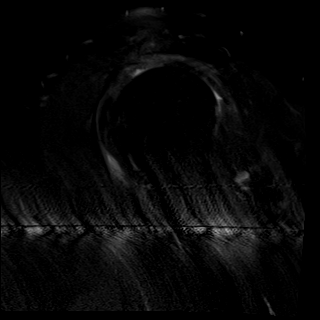
[im 20/25]
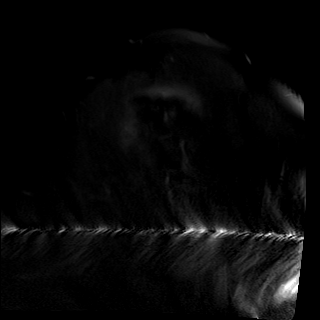
[im 25/25]
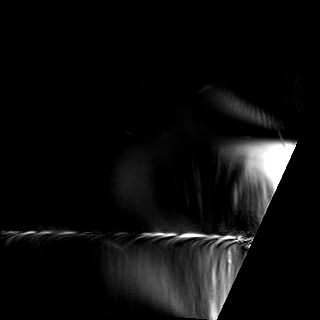

[40 of 40 positions shown; findings below may reference images not displayed]

FINDINGS: Despite efforts by the technologist and patient, mild motion
artifact is present on today's exam and could not be eliminated.
This reduces exam sensitivity and specificity. Some sequences were
repeated. Despite this, the axial images are limited.

Rotator cuff: There is a full-thickness insertional tear of the
supraspinatus tendon which appears nearly complete. This tear
measures approximately 2.6 cm AP on the sagittal images and is
associated with approximately 2.2 cm of tendon retraction. There is
also tendinosis and partial tearing of the infraspinatus tendon
anteriorly. The subscapularis and teres minor tendons appear intact.

Muscles: There is mild atrophy within both the supraspinatus and
infraspinatus muscles. No focal muscular edema.

Biceps long head:  Intact and normally positioned.

Acromioclavicular Joint: The acromion is type 2. There are
mild-to-moderate acromioclavicular degenerative changes. A small
amount of fluid is present in the subacromial-subdeltoid bursa,
communicating with the shoulder joint via the rotator cuff tear.

Glenohumeral Joint: Mild glenohumeral degenerative changes. No
significant shoulder joint effusion.

Labrum: Labral assessment limited by the lack of joint fluid and
motion. No evidence of labral tear or paralabral cyst.

Bones: No acute or significant extra-articular osseous findings.

Other: No significant soft tissue findings.
IMPRESSION: 1. Full-thickness insertional tear of the supraspinatus tendon with
associated tendon retraction and mild muscular atrophy.
2. Infraspinatus tendinosis and partial tearing anteriorly.
3. The labrum and biceps tendon appear intact. Labral assessment
limited by motion.
4. Mild-to-moderate acromioclavicular and mild glenohumeral
degenerative changes.

## 2020-08-12 ENCOUNTER — Other Ambulatory Visit: Payer: Self-pay

## 2020-08-12 ENCOUNTER — Encounter: Payer: Self-pay | Admitting: Orthopedic Surgery

## 2020-08-12 ENCOUNTER — Ambulatory Visit (INDEPENDENT_AMBULATORY_CARE_PROVIDER_SITE_OTHER): Payer: Medicare Other | Admitting: Orthopedic Surgery

## 2020-08-12 VITALS — BP 146/97 | HR 94 | Ht 74.0 in | Wt 258.0 lb

## 2020-08-12 DIAGNOSIS — S46011D Strain of muscle(s) and tendon(s) of the rotator cuff of right shoulder, subsequent encounter: Secondary | ICD-10-CM

## 2020-08-12 NOTE — Patient Instructions (Signed)
Surgery for Rotator Cuff Tear  Rotator cuff surgery is only recommended for individuals who have experienced persistent disability for greater than 3 months of non-surgical (conservative) treatment. Surgery is not necessary but is recommended for individuals who experience difficulty completing daily activities or athletes who are unable to compete. Rotator cuff tears do not usually heal without surgical intervention. If left alone small rotator cuff tears usually become larger. Younger athletes who have a rotator cuff tear may be recommended for surgery without attempting conservative rehabilitation. The purpose of surgery is to regain function of the shoulder joint and eliminate pain associated with the injury. In addition to repairing the tendon tear, the surgery may often remove a portion of the bony roof of the shoulder (acromion) as well as the chronically thickened and inflamed membrane below the acromion (subacromial bursa). REASONS NOT TO OPERATE  Infection of the shoulder. Inability to complete a rehabilitation program. Patients who have other conditions (emotional or psychological) conditions that contribute to their shoulder condition. RISKS AND COMPLICATIONS Infection. Re-tear of the rotator cuff tendons or muscles. Shoulder stiffness and/or weakness. Inability to compete in athletics. Acromioclavicular (AC) joint pain Risks of surgery: infection, bleeding, nerve damage, or damage to surrounding tissues. TECHNIQUE There are different surgical procedures used to treat rotator cuff tears. The type of procedure depends on the extent of injury as well as the surgeon's preference. All of the surgical techniques for rotator cuff tears have the same goal of repairing the torn tendon, removing part of the acromion, and removing the subacromial bursa. There are two main types of procedures: arthroscopic and open incision. Arthroscopic procedures are usually completed and you go home the same day  as surgery (outpatient). These procedures use multiple small incisions in which tools and a video camera are placed to work on the shoulder. An electric shaver removes the bursa, then a power burr shaves down the portion of the acromion that places pressure on the rotator cuff. Finally the rotator cuff is sewed (sutured) back to the humeral head. Open incision procedures require a larger incision. The deltoid muscle is detached from the acromion and a ligament in the shoulder (coracoacromial) is cut in order for the surgeon to access the rotator cuff. The subacromial bursa is removed as well as part of the acromion to give the rotator cuff room to move freely. The torn tendon is then sutured to the humeral head. After the rotator cuff is repaired, then the deltoid is reattached and the incision is closed up.  RECOVERY  Post-operative care depends on the surgical technique and the preferences of your therapist. Keep the wound clean and dry for the first 10 to 14 days after surgery. Keep your shoulder and arm in the sling provided to you for as long as you have been instructed to. You will be given pain medications by your caregiver. Passive (without using muscles) shoulder movements may be begun when instructed. It is important to follow through with you rehabilitation program in order to have the best possible recovery. RETURN TO SPORTS  The rehabilitation period will depend on the sport and position you play as well as the success of the operation. The minimum recovery period is 6 months. You must have regained complete shoulder motion and strength before returning to sports. SEEK IMMEDIATE MEDICAL CARE IF:  Any medications produce adverse side effects. Any complications from surgery occur: Pain, numbness, or coldness in the extremity operated upon. Discoloration of the nail beds (they become blue or gray) of   the extremity operated upon. Signs of infections (fever, pain, inflammation, redness, or  persistent bleeding).   You have decided to proceed with rotator cuff repair surgery. You have decided not to continue with nonoperative measures such as but not limited to oral medication,   activity modification, physical therapy, bracing, or injection.  We will perform rotator cuff repair. Some of the risks associated with rotator cuff repair include but are not limited to Bleeding Infection Swelling Stiffness Blood clot Pain Re-tearing of the rotator cuff Failure of the rotator cuff to heal   If you're not comfortable with these risks and would like to continue with nonoperative treatment please let Dr. Stephanos Fan know prior to your surgery.  

## 2020-08-12 NOTE — Progress Notes (Signed)
MRI RESULTS FOLLOW UP   Encounter Diagnosis  Name Primary?  . Traumatic complete tear of right rotator cuff, subsequent encounter Yes    Chief Complaint  Patient presents with  . Shoulder Pain    right  . Results    Review MRI right shoulder    Follow-up after MRI was done for the history noted below still having pain  58 year old male established patient with new problem right shoulder pain for 2 months.  He said 2 months ago man fell in the yard He was 260 pounds and he had to pick him up.  He started having pain the next day and it has not let up for the last 2 months   He is on chronic main management managed by Toma Copier he is also on 2 anti-inflammatories as well as prednisone   He says the arm feels weak     + EXAM FINDINGS: Pain and tenderness right shoulder with weakness and decreased range of motion  MY READING OF THE MRI full-thickness cuff tear seems to be about a centimeter front to back with some retraction and atrophy involves infraspinatus as well  MRI REPORT:     IMPRESSION: 1. Full-thickness insertional tear of the supraspinatus tendon with associated tendon retraction and mild muscular atrophy. 2. Infraspinatus tendinosis and partial tearing anteriorly. 3. The labrum and biceps tendon appear intact. Labral assessment limited by motion. 4. Mild-to-moderate acromioclavicular and mild glenohumeral degenerative changes.     Electronically Signed   By: Carey Bullocks M.D.   On: 08/09/2020 17:02  ASSESSMENT AND PLAN :   The procedure has been fully reviewed with the patient; The risks and benefits of surgery have been discussed and explained and understood. Alternative treatment has also been reviewed, questions were encouraged and answered. The postoperative plan is also been reviewed.  Arthroscopy right shoulder mini open rotator cuff repair  Same day surgery  85-month recovery precautions given

## 2020-08-23 ENCOUNTER — Encounter (HOSPITAL_COMMUNITY): Payer: Self-pay | Admitting: Anesthesiology

## 2020-08-31 NOTE — Patient Instructions (Signed)
Tyler Hart  08/31/2020     @PREFPERIOPPHARMACY @   Your procedure is scheduled on  09/03/2020.  Report to [facility] at  [phone]   A.M.  Call this number if you have problems the morning of surgery:  [phone]   Remember:  Do not eat or drink after midnight.                         Take these medicines the morning of surgery with A SIP OF WATER  Baclofen, colchicine, gabapentin, mobic or oxycodone(if needed).    Do not wear jewelry, make-up or nail polish.  Do not wear lotions, powders, or perfumes, or deodorant. Please brush your teeth.  Do not shave 48 hours prior to surgery.  Men may shave face and neck.  Do not bring valuables to the hospital.  [facility] is not responsible for any belongings or valuables.  Contacts, dentures or bridgework may not be worn into surgery.  Leave your suitcase in the car.  After surgery it may be brought to your room.  For patients admitted to the hospital, discharge time will be determined by your treatment team.  Patients discharged the day of surgery will not be allowed to drive home.   Name and phone number of your driver:   Family   Special instructions:     DO NOT smoke the morning of your procedure.                       Bathe with 1/2 bottle of CHG the night before and the morning of your procedure. Dry off with a clean towel, put on clean clothes and clean sheets on your bed to sleep on the night before your surgery.    Please read over the following fact sheets that you were given. Surgical Site Infection Prevention and Anesthesia Post-op Instructions       Surgery for Rotator Cuff Tear, Care After This sheet gives you information about how to care for yourself after your procedure. Your health care provider may also give you more specific instructions. If you have problems or questions, contact your health care provider. What can I expect after the procedure? After the procedure, it is common to  have:  Redness.  Swelling.  A small amount of fluid or blood in the incision area.  Pain.  Stiffness. Follow these instructions at home: If you have a sling or a shoulder immobilizer:  Wear it as told by your health care provider. Remove it only as told by your health care provider.  Loosen it if your fingers tingle, become numb, or turn cold and blue.  Keep it clean and dry. Bathing  Do not take baths, swim, or use a hot tub until your health care provider approves. Ask your health care provider if you may take showers. You may only be allowed to take sponge baths.  Keep your bandage (dressing) dry until your health care provider says it can be removed.  If your sling or shoulder immobilizer is not waterproof: ? Do not let it get wet. ? Remove it when you take a bath or shower as told by your health care provider. Once the sling or shoulder immobilizer is removed, try not to move your shoulder until your health care provider says that you can. Incision care  Follow instructions from your health care provider about how to take care of  your incision. Make sure you: ? Wash your hands with soap and water for at least 20 seconds before and after you change your dressing. If soap and water are not available, use hand sanitizer. ? Change your dressing as told by your health care provider. ? Leave stitches (sutures), skin glue, or adhesive strips in place. These skin closures may need to stay in place for 2 weeks or longer. If adhesive strip edges start to loosen and curl up, you may trim the loose edges. Do not remove adhesive strips completely unless your health care provider tells you to do that.  Check your incision area every day for signs of infection. Check for: ? More redness, swelling, or pain. ? More fluid or blood. ? Warmth. ? Pus or a bad smell.   Managing pain, stiffness, and swelling  If directed, put ice on your shoulder area. To do this: ? Put ice in a plastic  bag. ? Place a towel between your skin and the bag. ? Leave the ice on for 20 minutes, 2-3 times a day. ? Remove the ice if your skin turns bright red. This is very important. If you cannot feel pain, heat, or cold, you have a greater risk of damage to the area.  Move your fingers often to reduce stiffness and swelling.  Raise (elevate) your upper body on pillows when you lie down and when you sleep. ? Do not sleep on the front of your body (abdomen). ? Do not sleep on the side that your surgery was performed on.   Medicines  Take over-the-counter and prescription medicines only as told by your health care provider.  Ask your health care provider if the medicine prescribed to you: ? Requires you to avoid driving or using machinery. ? Can cause constipation. You may need to take these actions to prevent or treat constipation:  Drink enough fluid to keep your urine pale yellow.  Take over-the-counter or prescription medicines.  Eat foods that are high in fiber, such as beans, whole grains, and fresh fruits and vegetables.  Limit foods that are high in fat and processed sugars, such as fried or sweet foods. Driving  If you were given a sedative during the procedure, it can affect you for several hours. Do not drive or operate machinery until your health care provider says that it is safe.  Do not drive while wearing a sling or a shoulder immobilizer. Ask your health care provider when it is safe to drive. Activity  Do not use your arm to support your body weight until your health care provider says that you can.  Do not lift or hold anything with your arm until your health care provider approves.  Return to your normal activities as told by your health care provider. Ask your health care provider what activities are safe for you.  Do exercises as told by your health care provider. General instructions  Do not use any products that contain nicotine or tobacco, such as cigarettes,  e-cigarettes, and chewing tobacco. These can delay healing after surgery. If you need help quitting, ask your health care provider.  Keep all follow-up visits. This is important. Contact a health care provider if:  You have a fever or chills.  You have more redness, swelling, or pain around your incision.  You have more fluid or blood coming from your incision.  Your incision feels warm to the touch.  You have pus or a bad smell coming from your incision.  You have pain that gets worse or does not get better with medicine. Get help right away if:  You have severe pain.  You lose feeling in your arm or hand.  Your hand or fingers turn very pale or blue. Summary  If you have a sling or shoulder immobilizer, wear it as told by your health care provider. Remove it only as told by your health care provider.  Change your dressing as told by your health care provider. Check the incision area every day for signs of infection.  If directed, put ice on your shoulder area 2-3 times a day.  Do not use your arm to lift anything or to support your body weight until your health care provider says that you can. This information is not intended to replace advice given to you by your health care provider. Make sure you discuss any questions you have with your health care provider. Document Revised: 12/03/2019 Document Reviewed: 12/03/2019 Elsevier Patient Education  2021 [place] Anesthesia, Adult, Care After This sheet gives you information about how to care for yourself after your procedure. Your health care provider may also give you more specific instructions. If you have problems or questions, contact your health care provider. What can I expect after the procedure? After the procedure, the following side effects are common:  Pain or discomfort at the IV site.  Nausea.  Vomiting.  Sore throat.  Trouble concentrating.  Feeling cold or chills.  Feeling weak or  tired.  Sleepiness and fatigue.  Soreness and body aches. These side effects can affect parts of the body that were not involved in surgery. Follow these instructions at home: For the time period you were told by your health care provider:  Rest.  Do not participate in activities where you could fall or become injured.  Do not drive or use machinery.  Do not drink alcohol.  Do not take sleeping pills or medicines that cause drowsiness.  Do not make important decisions or sign legal documents.  Do not take care of children on your own.   Eating and drinking  Follow any instructions from your health care provider about eating or drinking restrictions.  When you feel hungry, start by eating small amounts of foods that are soft and easy to digest (bland), such as toast. Gradually return to your regular diet.  Drink enough fluid to keep your urine pale yellow.  If you vomit, rehydrate by drinking water, juice, or clear broth. General instructions  If you have sleep apnea, surgery and certain medicines can increase your risk for breathing problems. Follow instructions from your health care provider about wearing your sleep device: ? Anytime you are sleeping, including during daytime naps. ? While taking prescription pain medicines, sleeping medicines, or medicines that make you drowsy.  Have a responsible adult stay with you for the time you are told. It is important to have someone help care for you until you are awake and alert.  Return to your normal activities as told by your health care provider. Ask your health care provider what activities are safe for you.  Take over-the-counter and prescription medicines only as told by your health care provider.  If you smoke, do not smoke without supervision.  Keep all follow-up visits as told by your health care provider. This is important. Contact a health care provider if:  You have nausea or vomiting that does not get better  with medicine.  You cannot eat or drink without vomiting.  You have pain that does not get better with medicine.  You are unable to pass urine.  You develop a skin rash.  You have a fever.  You have redness around your IV site that gets worse. Get help right away if:  You have difficulty breathing.  You have chest pain.  You have blood in your urine or stool, or you vomit blood. Summary  After the procedure, it is common to have a sore throat or nausea. It is also common to feel tired.  Have a responsible adult stay with you for the time you are told. It is important to have someone help care for you until you are awake and alert.  When you feel hungry, start by eating small amounts of foods that are soft and easy to digest (bland), such as toast. Gradually return to your regular diet.  Drink enough fluid to keep your urine pale yellow.  Return to your normal activities as told by your health care provider. Ask your health care provider what activities are safe for you. This information is not intended to replace advice given to you by your health care provider. Make sure you discuss any questions you have with your health care provider. Document Revised: 04/15/2020 Document Reviewed: 11/13/2019 Elsevier Patient Education  2021 [employer].

## 2020-09-01 ENCOUNTER — Encounter (HOSPITAL_COMMUNITY): Payer: Self-pay

## 2020-09-01 ENCOUNTER — Telehealth: Payer: Self-pay | Admitting: Radiology

## 2020-09-01 ENCOUNTER — Encounter (HOSPITAL_COMMUNITY)
Admission: RE | Admit: 2020-09-01 | Discharge: 2020-09-01 | Disposition: A | Discharge status: Home / Self Care | Payer: Medicare Other | Source: Ambulatory Visit | Attending: Orthopedic Surgery | Admitting: Orthopedic Surgery

## 2020-09-01 ENCOUNTER — Other Ambulatory Visit (HOSPITAL_COMMUNITY): Payer: Medicare Other | Attending: Orthopedic Surgery

## 2020-09-01 NOTE — Telephone Encounter (Signed)
-----   Message from Wilmer Floor, RN sent at 09/01/2020  8:52 AM EST ----- Regarding: Mr Milledge tested positive for Covid and is having symptoms Hey Dr Aline Brochure and Lex Linhares,  Mr Fooks stated he had a covid test at a tent in [place] and tested positive and is having symptoms. He is for surgery 09/03/2020.  I told him the office will call and reschedule   Thanks,   Rosalyn Gess RN

## 2020-09-08 ENCOUNTER — Telehealth: Payer: Self-pay | Admitting: Radiology

## 2020-09-08 NOTE — Telephone Encounter (Signed)
He cant get copy of the covid test report. He said okay to repeat it  We will RS him to Feb 4th.

## 2020-09-08 NOTE — Telephone Encounter (Signed)
He really needs to provide [place] a copy of his positive test for the surgery, he pay come up positive again and without the positive result documentation his surgery will have to be RS

## 2020-09-09 NOTE — Telephone Encounter (Signed)
Left message for him to call me back.  

## 2020-09-10 NOTE — Telephone Encounter (Signed)
I called again, he is still sick so he has decided to wait until Feb 25th for the surgery. He has copy of the positive covid test on his phone, and will bring with him for preop. I have advised Hoyle Sauer.

## 2020-09-15 ENCOUNTER — Ambulatory Visit: Payer: Medicare Other | Admitting: Orthopedic Surgery

## 2020-09-17 ENCOUNTER — Encounter (HOSPITAL_COMMUNITY): Payer: Self-pay

## 2020-09-17 ENCOUNTER — Emergency Department (HOSPITAL_COMMUNITY): Payer: Medicare Other

## 2020-09-17 ENCOUNTER — Other Ambulatory Visit: Payer: Self-pay

## 2020-09-17 ENCOUNTER — Emergency Department (HOSPITAL_COMMUNITY)
Admission: EM | Admit: 2020-09-17 | Discharge: 2020-09-17 | Disposition: A | Discharge status: Home / Self Care | Payer: Medicare Other | Attending: Emergency Medicine | Admitting: Emergency Medicine

## 2020-09-17 DIAGNOSIS — M79671 Pain in right foot: Secondary | ICD-10-CM | POA: Diagnosis not present

## 2020-09-17 DIAGNOSIS — Z96652 Presence of left artificial knee joint: Secondary | ICD-10-CM | POA: Diagnosis not present

## 2020-09-17 DIAGNOSIS — M545 Low back pain, unspecified: Secondary | ICD-10-CM | POA: Insufficient documentation

## 2020-09-17 DIAGNOSIS — I10 Essential (primary) hypertension: Secondary | ICD-10-CM | POA: Insufficient documentation

## 2020-09-17 IMAGING — DX DG FOOT COMPLETE 3+V*R*
3 series · 3 of 3 positions shown · non-contrast
Comparison: None.

CLINICAL DATA: First MTP joint pain.  Possible gout.

EXAM:
RIGHT FOOT COMPLETE - 3+ VIEW

[foot ap]
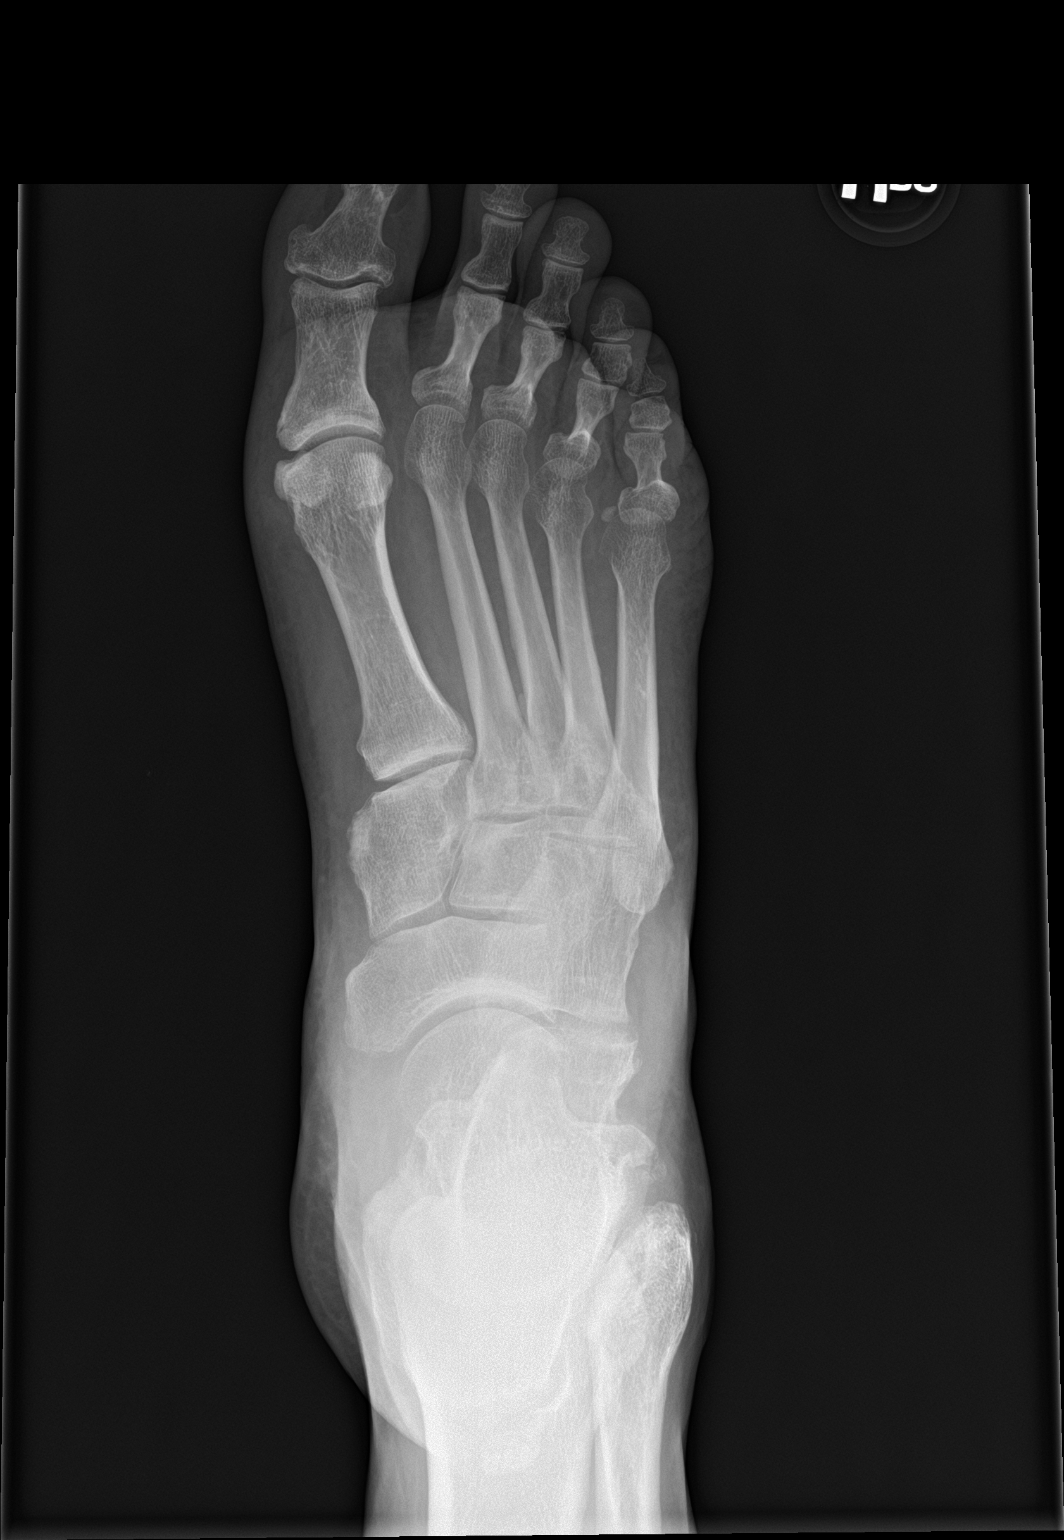

[foot obl]
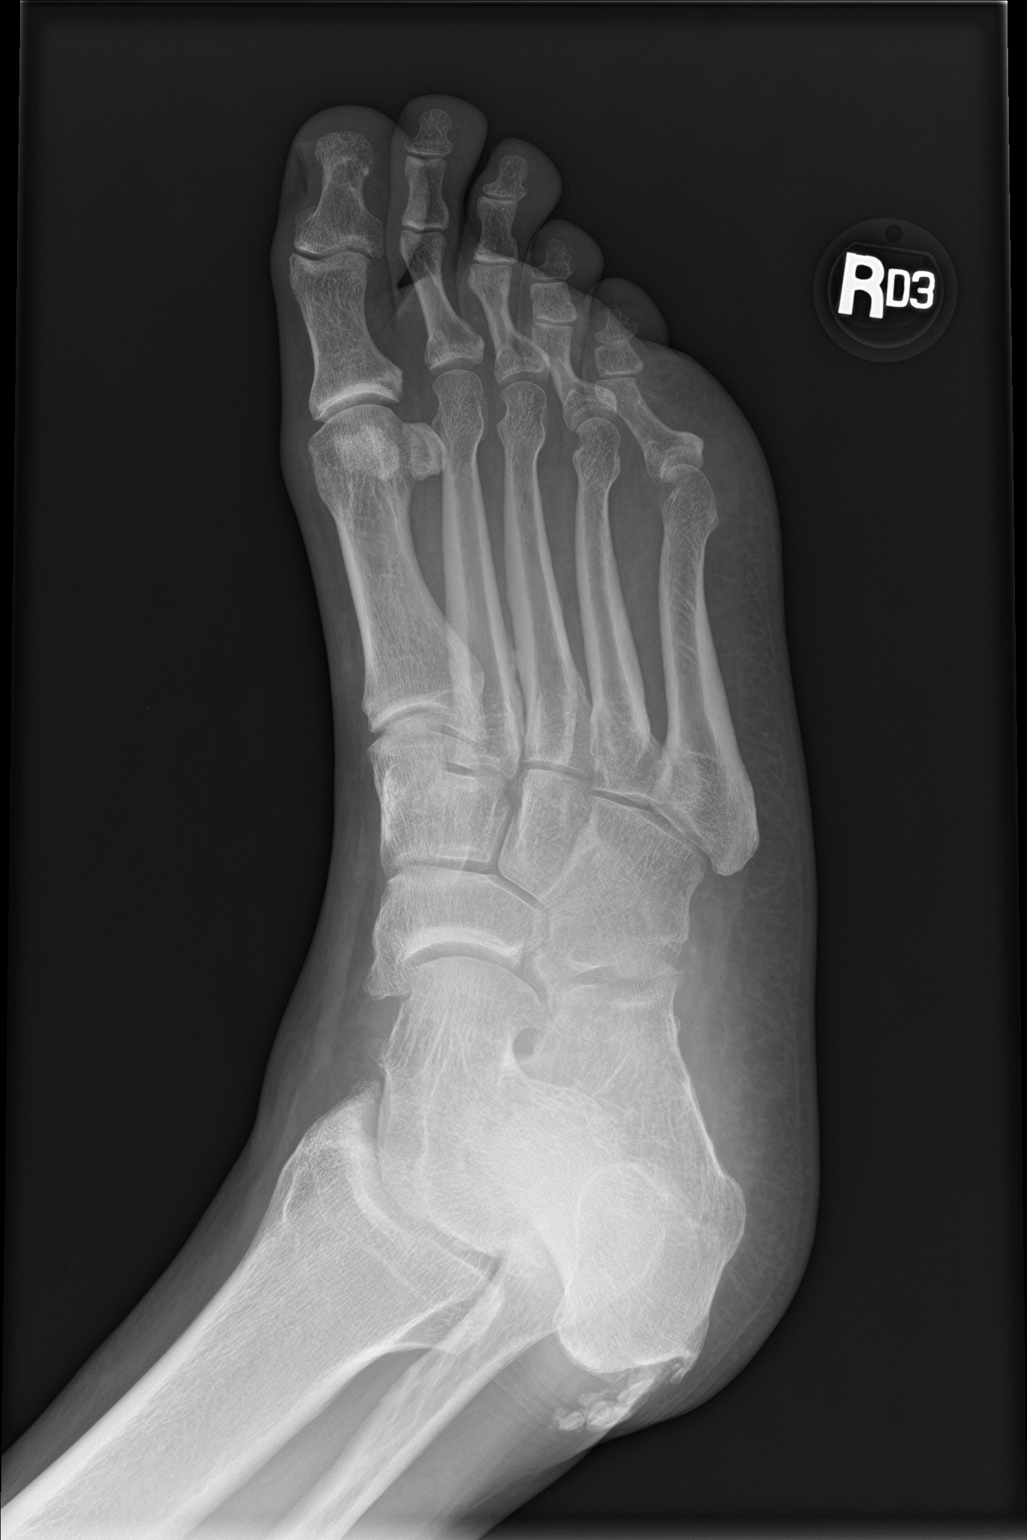

[foot lat]
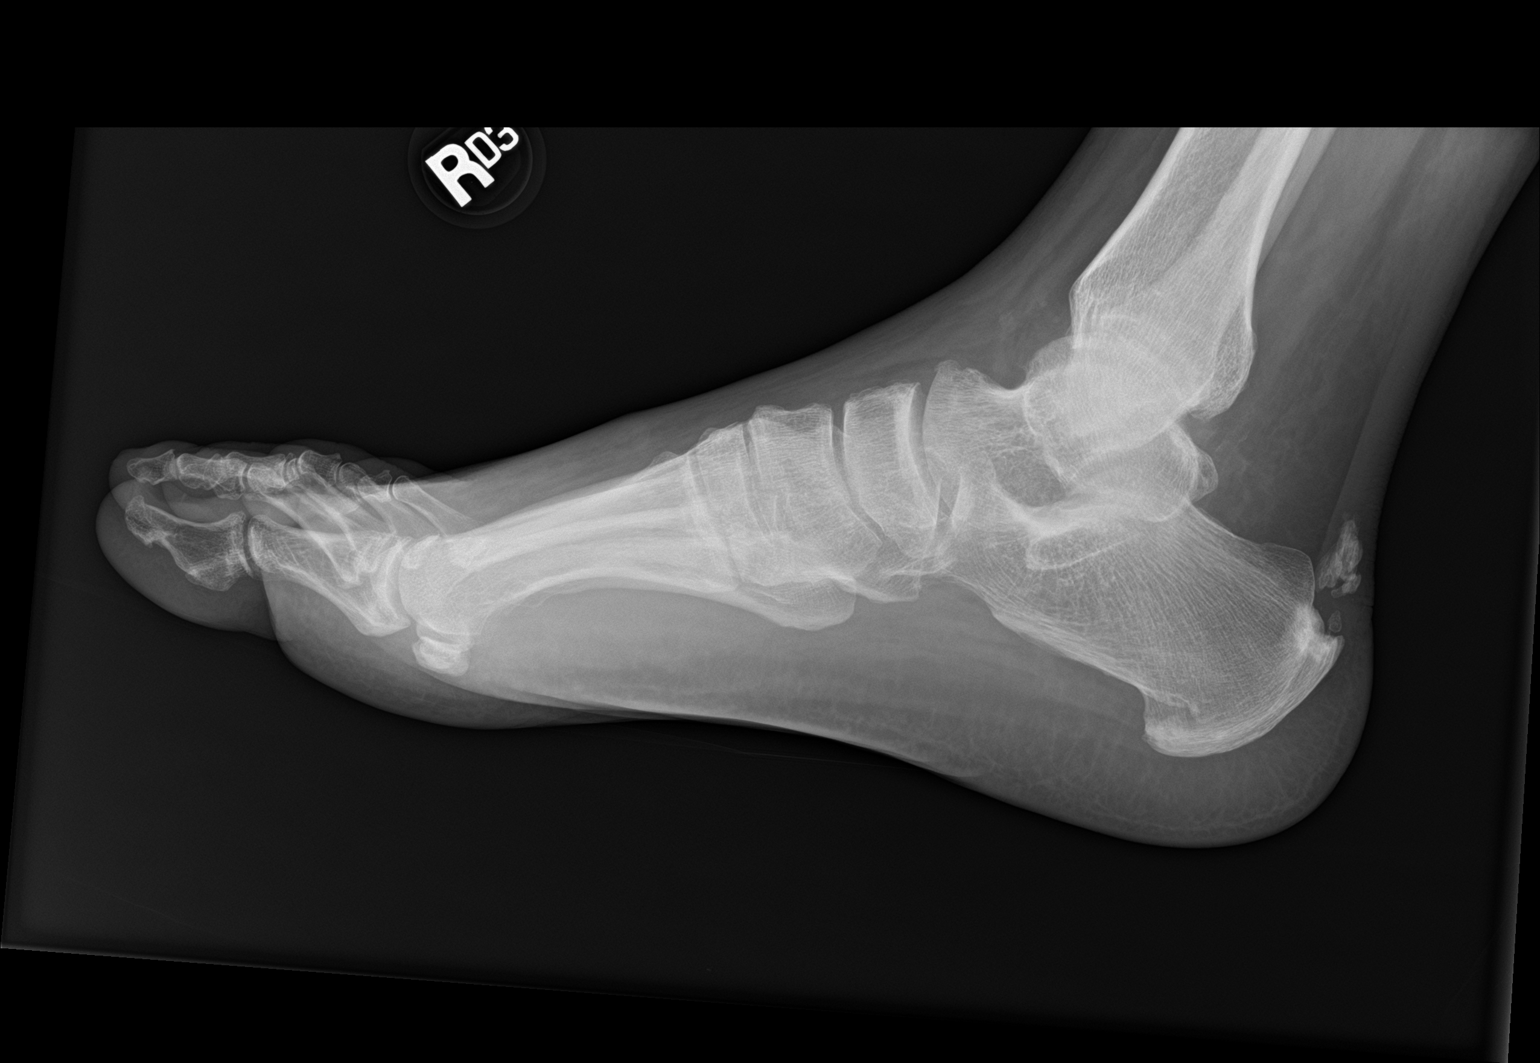

[3 of 3 positions shown; findings below may reference images not displayed]

FINDINGS: No fractures or dislocations. No bony erosions to suggest gout.
Enthesopathic changes at the Achilles insertion site on the
posterior calcaneus. Mild degenerative changes in the hindfoot. No
other acute abnormalities.
IMPRESSION: No bony erosions to suggest gout. Degenerative changes as above.
Achilles enthesopathic changes.

## 2020-09-17 IMAGING — DX DG LUMBAR SPINE COMPLETE 4+V
5 series · 5 of 5 positions shown · non-contrast
Comparison: None.

CLINICAL DATA: Right low back pain. First MTP pain and swelling.
Possible gout.

EXAM:
LUMBAR SPINE - COMPLETE 4+ VIEW

[l-spine ap]
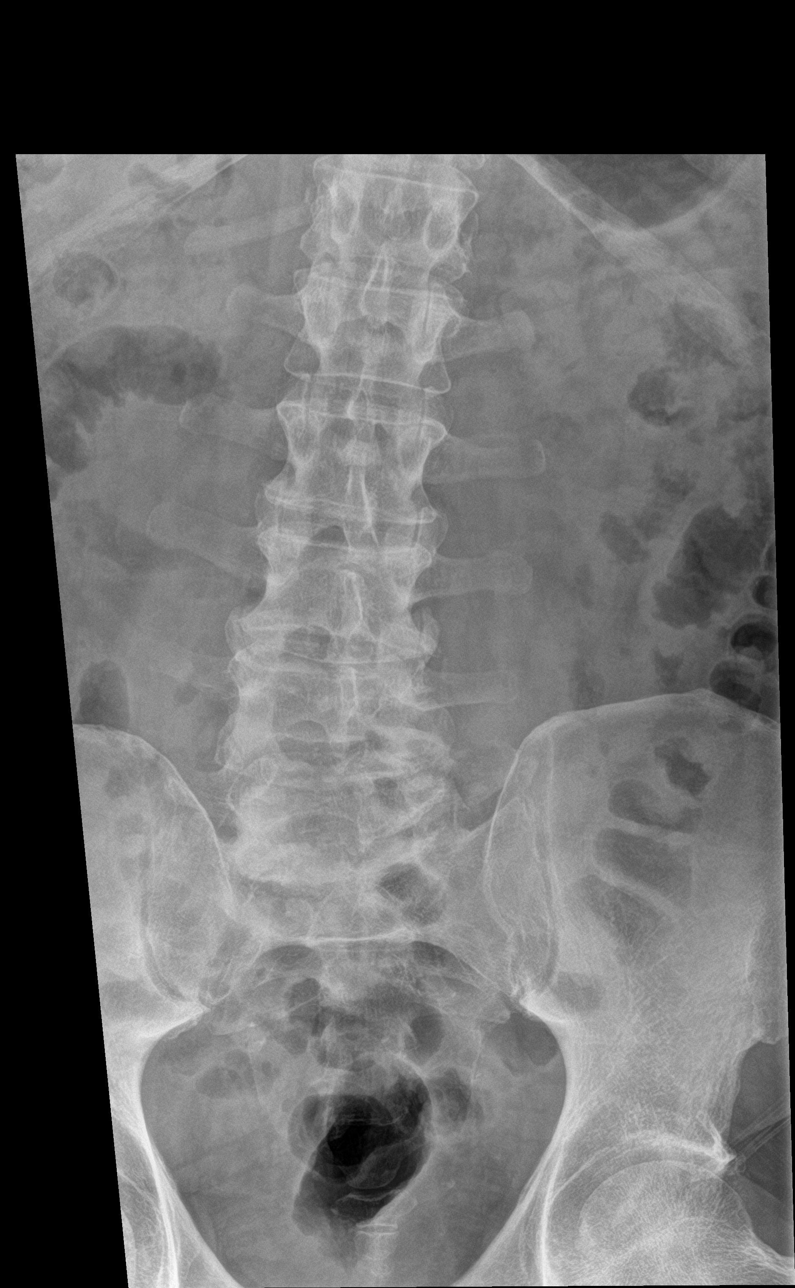

[l-spine obl (1 of 2)]
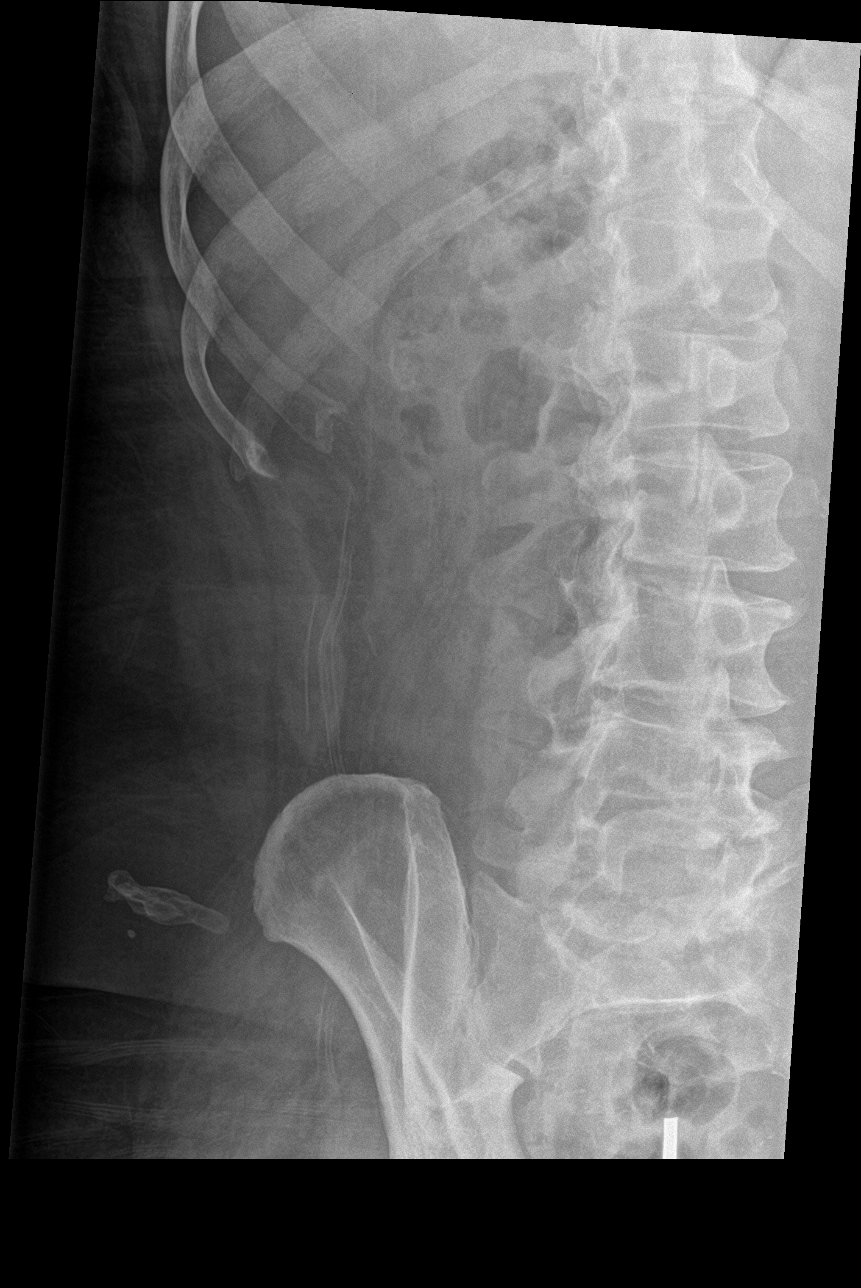

[l-spine lat]
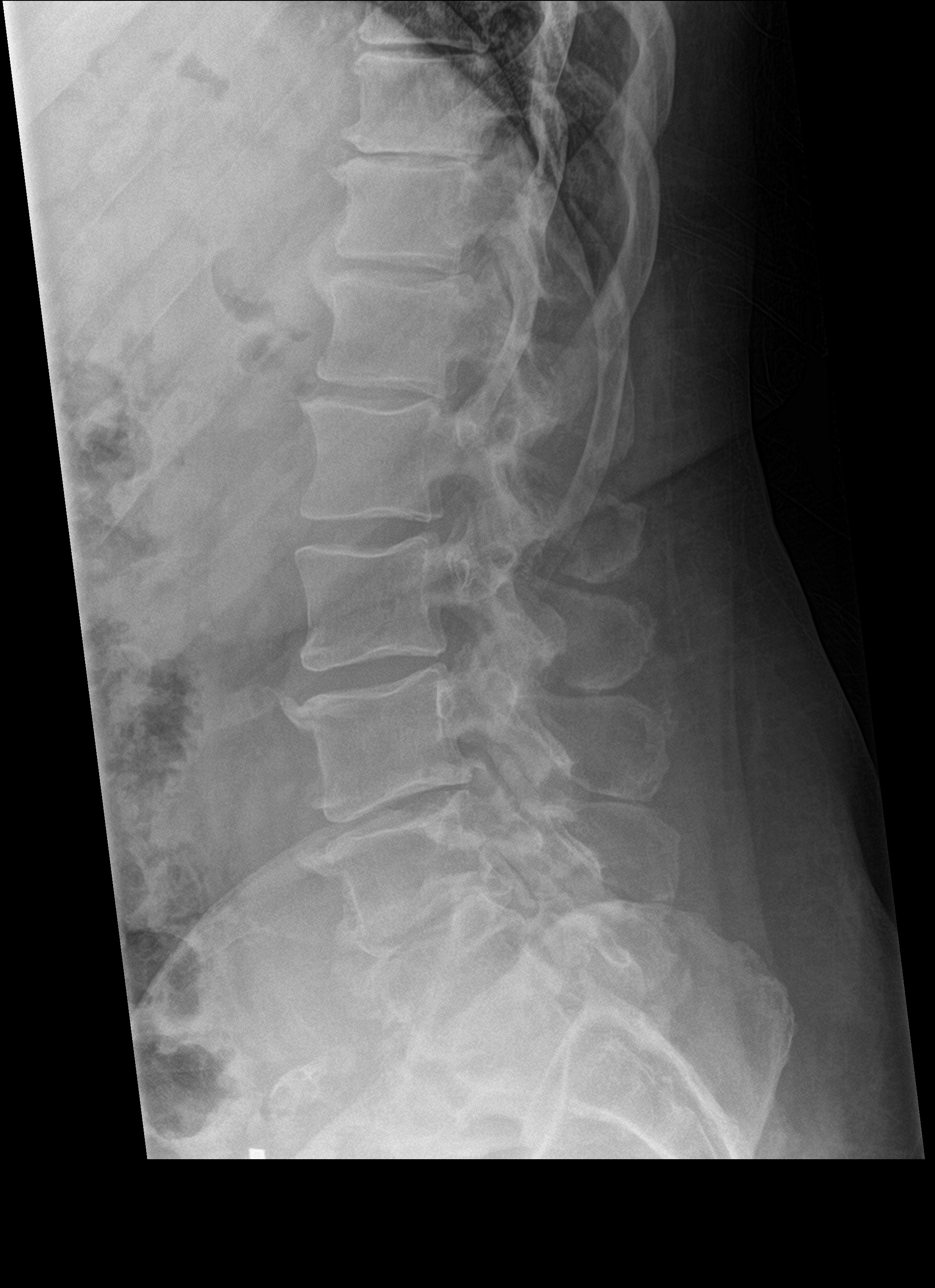

[l-spine spot]
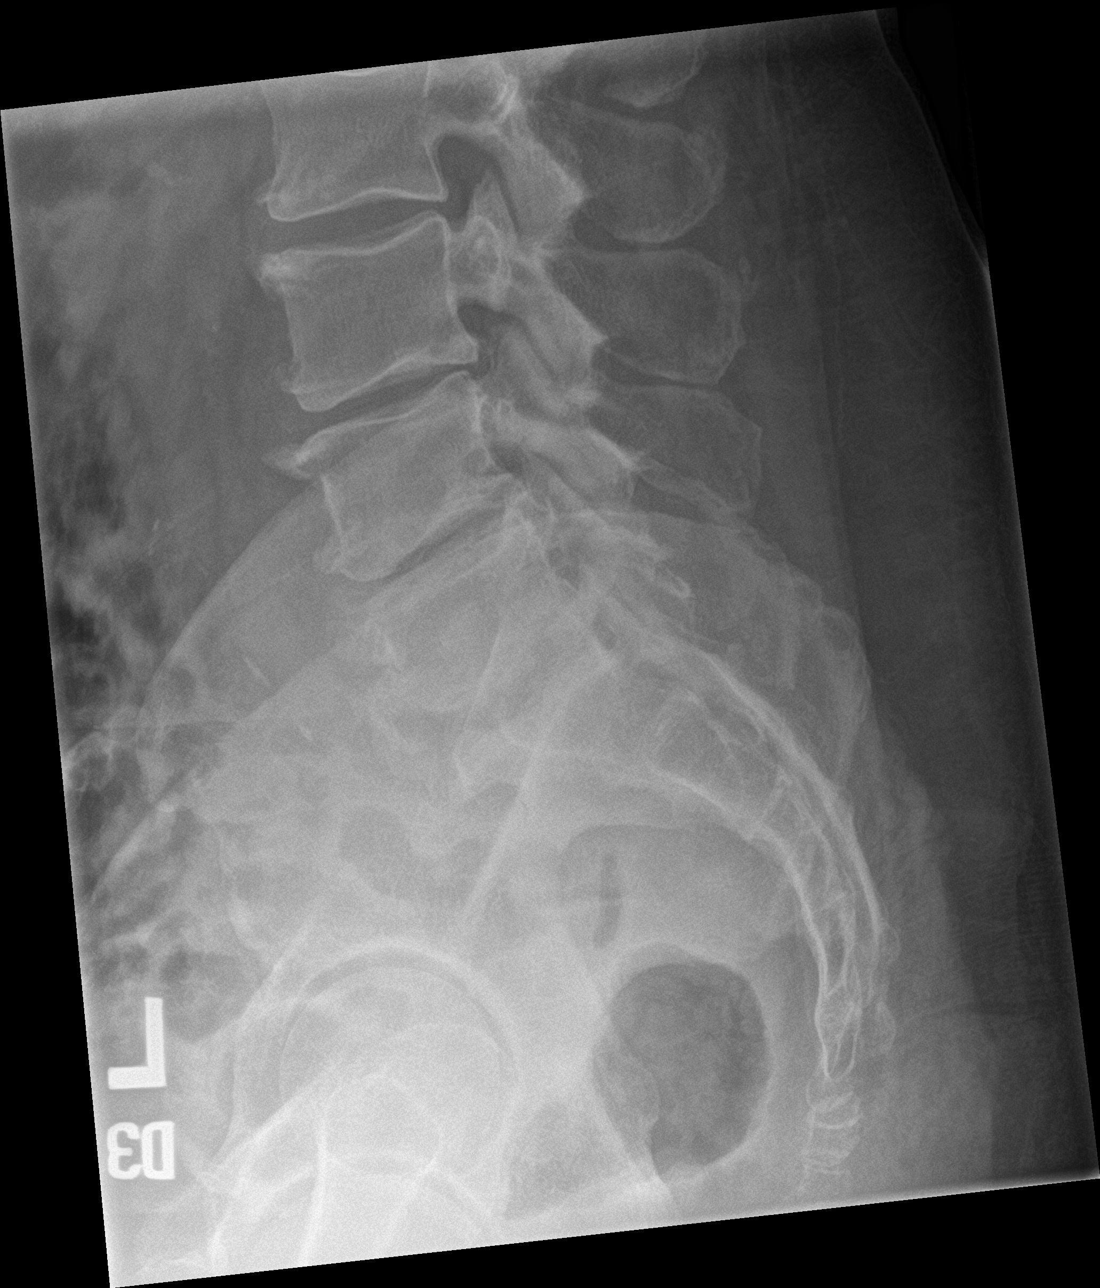

[l-spine obl (2 of 2)]
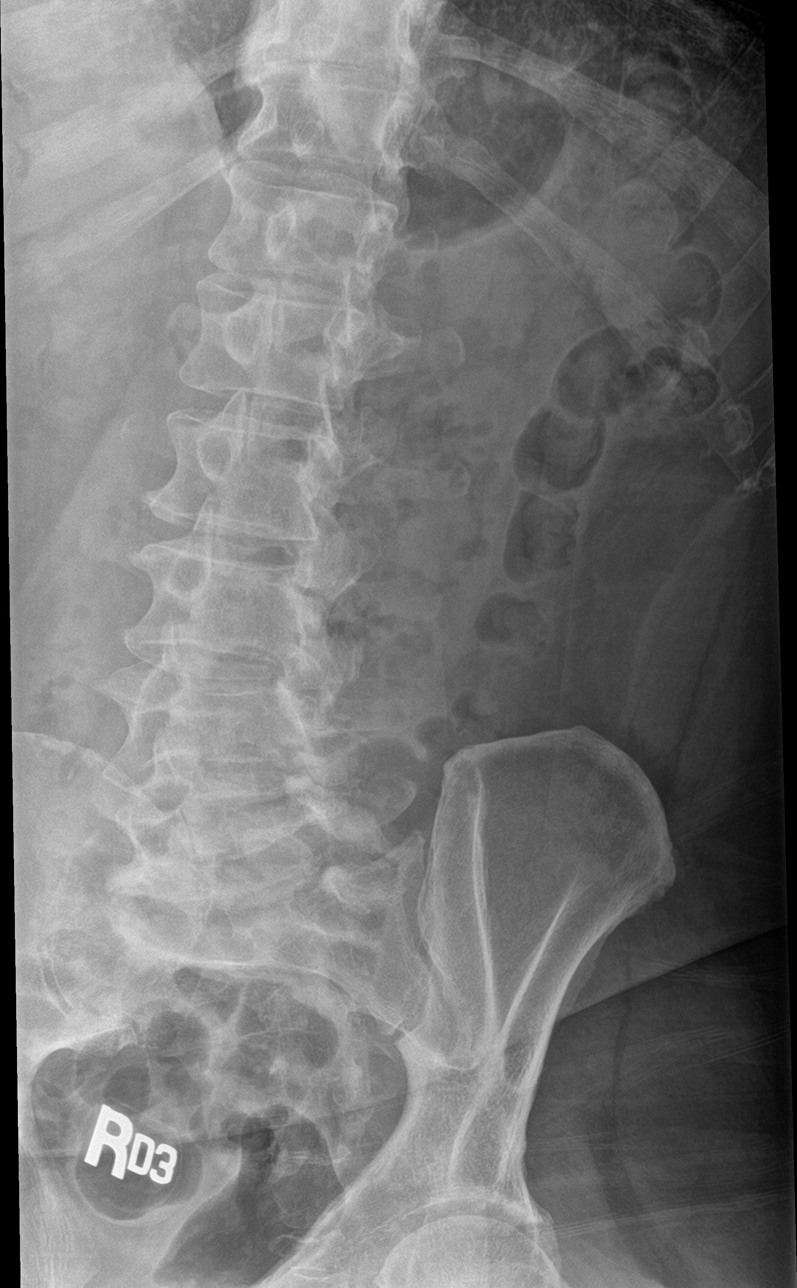

[5 of 5 positions shown; findings below may reference images not displayed]

FINDINGS: Multilevel degenerative disc disease, moderate, with moderate to
large anterior osteophytes. Suspected mild lower lumbar facet
degenerative changes. No fractures or malalignment. No other acute
abnormalities.
IMPRESSION: Degenerative changes.  No other abnormalities.

## 2020-09-17 MED ORDER — DEXAMETHASONE SODIUM PHOSPHATE 10 MG/ML IJ SOLN
8.0000 mg | Freq: Once | INTRAMUSCULAR | Status: AC
  Administered 2020-09-17: 8 mg via INTRAMUSCULAR
  Filled 2020-09-17: qty 1

## 2020-09-17 NOTE — Discharge Instructions (Addendum)
At this time there does not appear to be the presence of an emergent medical condition, however there is always the potential for conditions to change. Please read and follow the below instructions.  Please return to the Emergency Department immediately for any new or worsening symptoms. Please be sure to follow up with your Primary Care Provider within one week regarding your visit today; please call their office to schedule an appointment even if you are feeling better for a follow-up visit. Please call Dr. Aline Brochure on your discharge paperwork for further evaluation of your back pain and foot pain.  You are given a steroid today which may help with your pain.  Please follow your pain management plan and follow-up with your PCP.  Go to the nearest Emergency Department immediately if: You have fever or chills You develop new bowel or bladder control problems. You have unusual weakness or numbness in your arms or legs. You develop nausea or vomiting. You develop abdominal pain. You feel faint. You have any new/concerning or worsening of symptoms    Please read the additional information packets attached to your discharge summary.  Do not take your medicine if  develop an itchy rash, swelling in your mouth or lips, or difficulty breathing; call 911 and seek immediate emergency medical attention if this occurs.  You may review your lab tests and imaging results in their entirety on your MyChart account.  Please discuss all results of fully with your primary care provider and other specialist at your follow-up visit.  Note: Portions of this text may have been transcribed using voice recognition software. Every effort was made to ensure accuracy; however, inadvertent computerized transcription errors may still be present.

## 2020-09-17 NOTE — ED Triage Notes (Signed)
Pt presents to ED with complaints of right foot pain and lower back pain x couple of days.
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
## 2020-10-04 NOTE — Patient Instructions (Addendum)
Tyler Hart  10/04/2020     @PREFPERIOPPHARMACY @   Your procedure is scheduled on  10/08/2020.   Report to [facility] at  [phone]  A.M.   Call this number if you have problems the morning of surgery:  [phone]   Remember:  Do not eat or drink after midnight.                        Take these medicines the morning of surgery with A SIP OF WATER  Baclofen, gabapentin, mobic or oxycodone (if needed).  Shower the night before and the morning of your procedure with CHG. DO NOT put CHG on your face, hair or genitals.    Put clean sheets on your bed the night before your surgery and DO NOT sleep with pets this night.  After each shower, dry off with a clean towel and put on clean clothes. The morning of your procedure, brush your teeth before you come to the hospital.      Do not wear jewelry, make-up or nail polish.  Do not wear lotions, powders, or perfumes, or deodorant.  Do not shave 48 hours prior to surgery.  Men may shave face and neck.  Do not bring valuables to the hospital.  [facility] is not responsible for any belongings or valuables.   Contacts, dentures or bridgework may not be worn into surgery.  Leave your suitcase in the car.  After surgery it may be brought to your room.  For patients admitted to the hospital, discharge time will be determined by your treatment team.  Patients discharged the day of surgery will not be allowed to drive home and must have someone with them for 24 hours.   Special instructions:  DO NOT smoke tobacco or vape the morning of your procedure.   Please read over the following fact sheets that you were given. Coughing and Deep Breathing, Surgical Site Infection Prevention, Anesthesia Post-op Instructions and Care and Recovery After Surgery      Surgery for Rotator Cuff Tear, Care After This sheet gives you information about how to care for yourself after your procedure. Your health care provider may also give you more  specific instructions. If you have problems or questions, contact your health care provider. What can I expect after the procedure? After the procedure, it is common to have:  Redness.  Swelling.  A small amount of fluid or blood in the incision area.  Pain.  Stiffness. Follow these instructions at home: If you have a sling or a shoulder immobilizer:  Wear it as told by your health care provider. Remove it only as told by your health care provider.  Loosen it if your fingers tingle, become numb, or turn cold and blue.  Keep it clean and dry. Bathing  Do not take baths, swim, or use a hot tub until your health care provider approves. Ask your health care provider if you may take showers. You may only be allowed to take sponge baths.  Keep your bandage (dressing) dry until your health care provider says it can be removed.  If your sling or shoulder immobilizer is not waterproof: ? Do not let it get wet. ? Remove it when you take a bath or shower as told by your health care provider. Once the sling or shoulder immobilizer is removed, try not to move your shoulder until your health care provider says that you can. Incision care  Follow instructions from your health care provider about how to take care of your incision. Make sure you: ? Wash your hands with soap and water for at least 20 seconds before and after you change your dressing. If soap and water are not available, use hand sanitizer. ? Change your dressing as told by your health care provider. ? Leave stitches (sutures), skin glue, or adhesive strips in place. These skin closures may need to stay in place for 2 weeks or longer. If adhesive strip edges start to loosen and curl up, you may trim the loose edges. Do not remove adhesive strips completely unless your health care provider tells you to do that.  Check your incision area every day for signs of infection. Check for: ? More redness, swelling, or pain. ? More fluid or  blood. ? Warmth. ? Pus or a bad smell.   Managing pain, stiffness, and swelling  If directed, put ice on your shoulder area. To do this: ? Put ice in a plastic bag. ? Place a towel between your skin and the bag. ? Leave the ice on for 20 minutes, 2-3 times a day. ? Remove the ice if your skin turns bright red. This is very important. If you cannot feel pain, heat, or cold, you have a greater risk of damage to the area.  Move your fingers often to reduce stiffness and swelling.  Raise (elevate) your upper body on pillows when you lie down and when you sleep. ? Do not sleep on the front of your body (abdomen). ? Do not sleep on the side that your surgery was performed on.   Medicines  Take over-the-counter and prescription medicines only as told by your health care provider.  Ask your health care provider if the medicine prescribed to you: ? Requires you to avoid driving or using machinery. ? Can cause constipation. You may need to take these actions to prevent or treat constipation:  Drink enough fluid to keep your urine pale yellow.  Take over-the-counter or prescription medicines.  Eat foods that are high in fiber, such as beans, whole grains, and fresh fruits and vegetables.  Limit foods that are high in fat and processed sugars, such as fried or sweet foods. Driving  If you were given a sedative during the procedure, it can affect you for several hours. Do not drive or operate machinery until your health care provider says that it is safe.  Do not drive while wearing a sling or a shoulder immobilizer. Ask your health care provider when it is safe to drive. Activity  Do not use your arm to support your body weight until your health care provider says that you can.  Do not lift or hold anything with your arm until your health care provider approves.  Return to your normal activities as told by your health care provider. Ask your health care provider what activities are safe  for you.  Do exercises as told by your health care provider. General instructions  Do not use any products that contain nicotine or tobacco, such as cigarettes, e-cigarettes, and chewing tobacco. These can delay healing after surgery. If you need help quitting, ask your health care provider.  Keep all follow-up visits. This is important. Contact a health care provider if:  You have a fever or chills.  You have more redness, swelling, or pain around your incision.  You have more fluid or blood coming from your incision.  Your incision feels warm to the touch.  You have pus or a bad smell coming from your incision.  You have pain that gets worse or does not get better with medicine. Get help right away if:  You have severe pain.  You lose feeling in your arm or hand.  Your hand or fingers turn very pale or blue. Summary  If you have a sling or shoulder immobilizer, wear it as told by your health care provider. Remove it only as told by your health care provider.  Change your dressing as told by your health care provider. Check the incision area every day for signs of infection.  If directed, put ice on your shoulder area 2-3 times a day.  Do not use your arm to lift anything or to support your body weight until your health care provider says that you can. This information is not intended to replace advice given to you by your health care provider. Make sure you discuss any questions you have with your health care provider. Document Revised: 12/03/2019 Document Reviewed: 12/03/2019 Elsevier Patient Education  2021 [place] Anesthesia, Adult, Care After This sheet gives you information about how to care for yourself after your procedure. Your health care provider may also give you more specific instructions. If you have problems or questions, contact your health care provider. What can I expect after the procedure? After the procedure, the following side  effects are common:  Pain or discomfort at the IV site.  Nausea.  Vomiting.  Sore throat.  Trouble concentrating.  Feeling cold or chills.  Feeling weak or tired.  Sleepiness and fatigue.  Soreness and body aches. These side effects can affect parts of the body that were not involved in surgery. Follow these instructions at home: For the time period you were told by your health care provider:  Rest.  Do not participate in activities where you could fall or become injured.  Do not drive or use machinery.  Do not drink alcohol.  Do not take sleeping pills or medicines that cause drowsiness.  Do not make important decisions or sign legal documents.  Do not take care of children on your own.   Eating and drinking  Follow any instructions from your health care provider about eating or drinking restrictions.  When you feel hungry, start by eating small amounts of foods that are soft and easy to digest (bland), such as toast. Gradually return to your regular diet.  Drink enough fluid to keep your urine pale yellow.  If you vomit, rehydrate by drinking water, juice, or clear broth. General instructions  If you have sleep apnea, surgery and certain medicines can increase your risk for breathing problems. Follow instructions from your health care provider about wearing your sleep device: ? Anytime you are sleeping, including during daytime naps. ? While taking prescription pain medicines, sleeping medicines, or medicines that make you drowsy.  Have a responsible adult stay with you for the time you are told. It is important to have someone help care for you until you are awake and alert.  Return to your normal activities as told by your health care provider. Ask your health care provider what activities are safe for you.  Take over-the-counter and prescription medicines only as told by your health care provider.  If you smoke, do not smoke without supervision.  Keep  all follow-up visits as told by your health care provider. This is important. Contact a health care provider if:  You have nausea or vomiting  that does not get better with medicine.  You cannot eat or drink without vomiting.  You have pain that does not get better with medicine.  You are unable to pass urine.  You develop a skin rash.  You have a fever.  You have redness around your IV site that gets worse. Get help right away if:  You have difficulty breathing.  You have chest pain.  You have blood in your urine or stool, or you vomit blood. Summary  After the procedure, it is common to have a sore throat or nausea. It is also common to feel tired.  Have a responsible adult stay with you for the time you are told. It is important to have someone help care for you until you are awake and alert.  When you feel hungry, start by eating small amounts of foods that are soft and easy to digest (bland), such as toast. Gradually return to your regular diet.  Drink enough fluid to keep your urine pale yellow.  Return to your normal activities as told by your health care provider. Ask your health care provider what activities are safe for you. This information is not intended to replace advice given to you by your health care provider. Make sure you discuss any questions you have with your health care provider. Document Revised: 04/15/2020 Document Reviewed: 11/13/2019 Elsevier Patient Education  2021 [employer].

## 2020-10-05 ENCOUNTER — Encounter (HOSPITAL_COMMUNITY): Payer: Self-pay

## 2020-10-05 ENCOUNTER — Other Ambulatory Visit (HOSPITAL_COMMUNITY)
Admission: RE | Admit: 2020-10-05 | Discharge: 2020-10-05 | Disposition: A | Discharge status: Home / Self Care | Payer: Medicare Other | Source: Ambulatory Visit | Attending: Orthopedic Surgery | Admitting: Orthopedic Surgery

## 2020-10-05 ENCOUNTER — Other Ambulatory Visit: Payer: Self-pay

## 2020-10-05 ENCOUNTER — Encounter (HOSPITAL_COMMUNITY)
Admission: RE | Admit: 2020-10-05 | Discharge: 2020-10-05 | Disposition: A | Discharge status: Home / Self Care | Payer: Medicare Other | Source: Ambulatory Visit | Attending: Orthopedic Surgery | Admitting: Orthopedic Surgery

## 2020-10-05 DIAGNOSIS — Z01812 Encounter for preprocedural laboratory examination: Secondary | ICD-10-CM | POA: Insufficient documentation

## 2020-10-05 LAB — CBC WITH DIFFERENTIAL/PLATELET
Abs Immature Granulocytes: 0.01 10*3/uL (ref 0.00–0.07)
Basophils Absolute: 0 10*3/uL (ref 0.0–0.1)
Basophils Relative: 1 %
Eosinophils Absolute: 0.2 10*3/uL (ref 0.0–0.5)
Eosinophils Relative: 3 %
HCT: 50.8 % (ref 39.0–52.0)
Hemoglobin: 16.7 g/dL (ref 13.0–17.0)
Immature Granulocytes: 0 %
Lymphocytes Relative: 32 %
Lymphs Abs: 2.2 10*3/uL (ref 0.7–4.0)
MCH: 32.9 pg (ref 26.0–34.0)
MCHC: 32.9 g/dL (ref 30.0–36.0)
MCV: 100 fL (ref 80.0–100.0)
Monocytes Absolute: 1 10*3/uL (ref 0.1–1.0)
Monocytes Relative: 15 %
Neutro Abs: 3.4 10*3/uL (ref 1.7–7.7)
Neutrophils Relative %: 49 %
Platelets: 194 10*3/uL (ref 150–400)
RBC: 5.08 MIL/uL (ref 4.22–5.81)
RDW: 15.1 % (ref 11.5–15.5)
WBC: 6.9 10*3/uL (ref 4.0–10.5)
nRBC: 0 % (ref 0.0–0.2)

## 2020-10-05 LAB — COMPREHENSIVE METABOLIC PANEL
ALT: 21 U/L (ref 0–44)
AST: 24 U/L (ref 15–41)
Albumin: 4 g/dL (ref 3.5–5.0)
Alkaline Phosphatase: 69 U/L (ref 38–126)
Anion gap: 11 (ref 5–15)
BUN: 10 mg/dL (ref 6–20)
CO2: 23 mmol/L (ref 22–32)
Calcium: 9.4 mg/dL (ref 8.9–10.3)
Chloride: 103 mmol/L (ref 98–111)
Creatinine, Ser: 0.88 mg/dL (ref 0.61–1.24)
GFR, Estimated: >60 mL/min (ref >60)
Glucose, Bld: 73 mg/dL (ref 70–99)
Potassium: 3.7 mmol/L (ref 3.5–5.1)
Sodium: 137 mmol/L (ref 135–145)
Total Bilirubin: 1.2 mg/dL (ref 0.3–1.2)
Total Protein: 7.7 g/dL (ref 6.5–8.1)

## 2020-10-05 LAB — RAPID URINE DRUG SCREEN, HOSP PERFORMED
Amphetamines: NOT DETECTED
Barbiturates: NOT DETECTED
Benzodiazepines: NOT DETECTED
Cocaine: POSITIVE — AB
Opiates: NOT DETECTED
Tetrahydrocannabinol: POSITIVE — AB

## 2020-10-05 LAB — PROTIME-INR
INR: 1 (ref 0.8–1.2)
Prothrombin Time: 13.1 seconds (ref 11.4–15.2)

## 2020-10-06 ENCOUNTER — Encounter (HOSPITAL_COMMUNITY): Payer: Self-pay | Admitting: Anesthesiology

## 2020-10-06 NOTE — Pre-Procedure Instructions (Signed)
Interoffice message sent to Amy Littrell and Dr Aline Brochure concerning canceling patient due to positive cocaine result.

## 2020-10-07 NOTE — Pre-Procedure Instructions (Signed)
I spoke with Tyler Hart today, explaining the reason behind the cancellation of his surgery. I also instructed him he would have to be cocaine free for 14 days and have a negative cocaine result before we could do his surgery. He will call the office to reschedule. He states, "that's fine".

## 2020-10-08 ENCOUNTER — Ambulatory Visit (HOSPITAL_COMMUNITY): Admission: RE | Admit: 2020-10-08 | Payer: Medicare Other | Source: Home / Self Care | Admitting: Orthopedic Surgery

## 2020-10-08 ENCOUNTER — Encounter (HOSPITAL_COMMUNITY): Admission: RE | Payer: Self-pay | Source: Home / Self Care

## 2020-10-08 SURGERY — REPAIR, ROTATOR CUFF, ARTHROSCOPIC
Anesthesia: Choice | Site: Shoulder | Laterality: Right
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
## 2021-03-29 ENCOUNTER — Encounter: Payer: Self-pay | Admitting: Internal Medicine

## 2021-03-29 ENCOUNTER — Ambulatory Visit: Payer: Self-pay

## 2021-03-29 ENCOUNTER — Other Ambulatory Visit: Payer: Self-pay

## 2021-03-29 ENCOUNTER — Other Ambulatory Visit: Payer: Self-pay | Admitting: Internal Medicine

## 2021-03-29 ENCOUNTER — Ambulatory Visit (INDEPENDENT_AMBULATORY_CARE_PROVIDER_SITE_OTHER): Payer: Medicare Other | Admitting: Internal Medicine

## 2021-03-29 VITALS — BP 139/94 | HR 79 | Ht 73.5 in | Wt 237.0 lb

## 2021-03-29 DIAGNOSIS — M1A09X1 Idiopathic chronic gout, multiple sites, with tophus (tophi): Secondary | ICD-10-CM

## 2021-03-29 DIAGNOSIS — R768 Other specified abnormal immunological findings in serum: Secondary | ICD-10-CM | POA: Insufficient documentation

## 2021-03-29 DIAGNOSIS — M79641 Pain in right hand: Secondary | ICD-10-CM | POA: Diagnosis not present

## 2021-03-29 DIAGNOSIS — M79642 Pain in left hand: Secondary | ICD-10-CM | POA: Insufficient documentation

## 2021-03-29 MED ORDER — ALLOPURINOL 300 MG PO TABS: 300.0000 mg | ORAL_TABLET | Freq: Every day | ORAL | 1 refills | Status: DC

## 2021-03-29 NOTE — Patient Instructions (Signed)
Allopurinol Tablets What is this medication? ALLOPURINOL (al oh PURE i nole) treats gout or kidney stones. It may also be used to prevent high uric acid levels after chemotherapy. It works bydecreasing uric acid levels in your body. This medicine may be used for other purposes; ask your health care provider orpharmacist if you have questions. COMMON BRAND NAME(S): Zyloprim What should I tell my care team before I take this medication? They need to know if you have any of these conditions: Kidney disease Liver disease An unusual or allergic reaction to allopurinol, other medications, foods, dyes, or preservatives Pregnant or trying to get pregnant Breast-feeding How should I use this medication? Take this medication by mouth with a glass of water. Follow the directions on the prescription label. If this medication upsets your stomach, take it with food or milk. Take your doses at regular intervals. Do not take your medicationmore often than directed. Talk to your care team regarding the use of this medication in children. Special care may be needed. While this medication may be prescribed forchildren as young as 6 years for selected conditions, precautions do apply. Overdosage: If you think you have taken too much of this medicine contact apoison control center or emergency room at once. NOTE: This medicine is only for you. Do not share this medicine with others. What if I miss a dose? If you miss a dose, take it as soon as you can. If it is almost time for yournext dose, take only that dose. Do not take double or extra doses. What may interact with this medication? Do not take this medication with the following: Didanosine, ddI This medication may also interact with the following: Certain antibiotics like amoxicillin, ampicillin Certain medications for cancer Certain medications for immunosuppression like azathioprine, cyclosporine,  mercaptopurine Chlorpropamide Probenecid Sulfinpyrazone Thiazide diuretics, like hydrochlorothiazide Warfarin This list may not describe all possible interactions. Give your health care provider a list of all the medicines, herbs, non-prescription drugs, or dietary supplements you use. Also tell them if you smoke, drink alcohol, or use illegaldrugs. Some items may interact with your medicine. What should I watch for while using this medication? Visit your care team for regular checks on your progress. If you are taking this medication to treat gout, you may not have less frequent attacks at first. Keep taking your medication regularly and the attacks should get better within 2 to 6 weeks. Drink plenty of water (10 to 12 full glasses a day) while you are taking this medication. This will help to reduce stomach upset and reduce therisk of getting gout or kidney stones. Call your care team at once if you get a skin rash together with chills, fever, sore throat, or nausea and vomiting, if you have blood in your urine, ordifficulty passing urine. This medication may cause serious skin reactions. They can happen weeks to months after starting the medication. Contact your care team right away if you notice fevers or flu-like symptoms with a rash. The rash may be red or purple and then turn into blisters or peeling of the skin. Or, you might notice a red rash with swelling of the face, lips or lymph nodes in your neck or under yourarms. Do not take vitamin C without asking your care team. Too much vitamin C canincrease the chance of getting kidney stones. You may get drowsy or dizzy. Do not drive, use machinery, or do anything that needs mental alertness until you know how this medication affects you. Do not stand  or sit up quickly, especially if you are an older patient. This reduces the risk of dizzy or fainting spells. Alcohol can make you more drowsy and dizzy. Alcohol can also increase the chance of stomach  problems and increasethe amount of uric acid in your blood. Avoid alcoholic drinks. What side effects may I notice from receiving this medication? Side effects that you should report to your care team as soon as possible: Allergic reactions-skin rash, itching, hives, swelling of the face, lips, tongue, or throat Kidney injury-decrease in the amount of urine, swelling of the ankles, hands, or feet Liver injury-right upper belly pain, loss of appetite, nausea, light-colored stool, dark yellow or brown urine, yellowing skin or eyes, unusual weakness or fatigue Rash, fever, and swollen lymph nodes Redness, blistering, peeling, or loosening of the skin, including inside the mouth Side effects that usually do not require medical attention (report to your careteam if they continue or are bothersome): Diarrhea Drowsiness Nausea Vomiting This list may not describe all possible side effects. Call your doctor for medical advice about side effects. You may report side effects to FDA at1-800-FDA-1088. Where should I keep my medication? Keep out of the reach of children and pets. Store at room temperature between 15 and 25 degrees C (59 and 77 degrees F). Protect from light and moisture. Throw away any unused medication after theexpiration date.  Low-Purine Eating Plan A low-purine eating plan involves making food choices to limit your intake of purine. Purine is a kind of uric acid. Too much uric acid in your blood can cause certain conditions, such as gout and kidney stones. Eating a low-purinediet can help control these conditions. What are tips for following this plan? Reading food labels Avoid foods with saturated or Trans fat. Check the ingredient list of grains-based foods, such as bread and cereal, to make sure that they contain whole grains. Check the ingredient list of sauces or soups to make sure they do not contain meat or fish. When choosing soft drinks, check the ingredient list to make sure  they do not contain high-fructose corn syrup. Shopping  Buy plenty of fresh fruits and vegetables. Avoid buying canned or fresh fish. Buy dairy products labeled as low-fat or nonfat. Avoid buying premade or processed foods. These foods are often high in fat, salt (sodium), and added sugar.  Cooking Use olive oil instead of butter when cooking. Oils like olive oil, canola oil, and sunflower oil contain healthy fats. Meal planning Learn which foods do or do not affect you. If you find out that a food tends to cause your gout symptoms to flare up, avoid eating that food. You can enjoy foods that do not cause problems. If you have any questions about a food item, talk with your dietitian or health care provider. Limit foods high in fat, especially saturated fat. Fat makes it harder for your body to get rid of uric acid. Choose foods that are lower in fat and are lean sources of protein. General guidelines Limit alcohol intake to no more than 1 drink a day for nonpregnant women and 2 drinks a day for men. One drink equals 12 oz of beer, 5 oz of wine, or 1 oz of hard liquor. Alcohol can affect the way your body gets rid of uric acid. Drink plenty of water to keep your urine clear or pale yellow. Fluids can help remove uric acid from your body. If directed by your health care provider, take a vitamin C supplement. Work with  your health care provider and dietitian to develop a plan to achieve or maintain a healthy weight. Losing weight can help reduce uric acid in your blood. What foods are recommended? The items listed may not be a complete list. Talk with your dietitian aboutwhat dietary choices are best for you. Foods low in purines Foods low in purines do not need to be limited. These include: All fruits. All low-purine vegetables, pickles, and olives. Breads, pasta, [place]per, cornbread, and popcorn. Cake and other baked goods. All dairy foods. Eggs, nuts, and nut butters. Spices and condiments,  such as salt, herbs, and vinegar. Plant oils, butter, and margarine. Water, sugar-free soft drinks, tea, coffee, and cocoa. Vegetable-based soups, broths, sauces, and gravies. Foods moderate in purines Foods moderate in purines should be limited to the amounts listed.  cup of asparagus, cauliflower, spinach, mushrooms, or green peas, each day. 2/3 cup uncooked oatmeal, each day.  cup dry wheat bran or wheat germ, each day. 2-3 ounces of meat or poultry, each day. 4-6 ounces of shellfish, such as crab, lobster, oysters, or shrimp, each day. 1 cup cooked beans, peas, or lentils, each day. Soup, broths, or bouillon made from meat or fish. Limit these foods as much as possible. What foods are not recommended? The items listed may not be a complete list. Talk with your dietitian aboutwhat dietary choices are best for you. Limit your intake of foods high in purines, including: Beer and other alcohol. Meat-based gravy or sauce. Canned or fresh fish, such as: Anchovies, sardines, herring, and tuna. Mussels and scallops. Codfish, trout, and haddock. Berniece Salines. Organ meats, such as: Liver or kidney. Tripe. Sweetbreads (thymus gland or pancreas). Wild Clinical biochemist. Yeast or yeast extract supplements. Drinks sweetened with high-fructose corn syrup. Summary Eating a low-purine diet can help control conditions caused by too much uric acid in the body, such as gout or kidney stones. Choose low-purine foods, limit alcohol, and limit foods high in fat. You will learn over time which foods do or do not affect you. If you find out that a food tends to cause your gout symptoms to flare up, avoid eating that food. This information is not intended to replace advice given to you by your health care provider. Make sure you discuss any questions you have with your healthcare provider.

## 2021-03-30 ENCOUNTER — Encounter (HOSPITAL_COMMUNITY): Payer: Self-pay

## 2021-03-30 ENCOUNTER — Other Ambulatory Visit: Payer: Self-pay

## 2021-03-30 ENCOUNTER — Emergency Department (HOSPITAL_COMMUNITY)
Admission: EM | Admit: 2021-03-30 | Discharge: 2021-03-30 | Disposition: A | Discharge status: Home / Self Care | Payer: Medicare Other | Attending: Emergency Medicine | Admitting: Emergency Medicine

## 2021-03-30 DIAGNOSIS — I10 Essential (primary) hypertension: Secondary | ICD-10-CM | POA: Diagnosis not present

## 2021-03-30 DIAGNOSIS — M25521 Pain in right elbow: Secondary | ICD-10-CM | POA: Diagnosis not present

## 2021-03-30 DIAGNOSIS — Z79899 Other long term (current) drug therapy: Secondary | ICD-10-CM | POA: Diagnosis not present

## 2021-03-30 DIAGNOSIS — Z96652 Presence of left artificial knee joint: Secondary | ICD-10-CM | POA: Insufficient documentation

## 2021-03-30 DIAGNOSIS — M109 Gout, unspecified: Secondary | ICD-10-CM

## 2021-03-30 MED ORDER — DEXAMETHASONE SODIUM PHOSPHATE 10 MG/ML IJ SOLN
8.0000 mg | Freq: Once | INTRAMUSCULAR | Status: AC
  Administered 2021-03-30: 8 mg via INTRAMUSCULAR
  Filled 2021-03-30: qty 1

## 2021-03-30 NOTE — ED Triage Notes (Signed)
History of gout in right elbow, started yesterday evening.  Right elbow swollen, has medication for it but its not helping.

## 2021-03-30 NOTE — Discharge Instructions (Addendum)
Continue take your chronic pain medicines.  Follow-up with your doctors
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
## 2021-04-26 ENCOUNTER — Ambulatory Visit (INDEPENDENT_AMBULATORY_CARE_PROVIDER_SITE_OTHER): Payer: Medicare Other | Admitting: Internal Medicine

## 2021-04-26 ENCOUNTER — Other Ambulatory Visit: Payer: Self-pay | Admitting: Internal Medicine

## 2021-04-26 ENCOUNTER — Encounter: Payer: Self-pay | Admitting: Internal Medicine

## 2021-04-26 ENCOUNTER — Other Ambulatory Visit: Payer: Self-pay

## 2021-04-26 VITALS — BP 105/72 | HR 95 | Ht 74.0 in | Wt 241.2 lb

## 2021-04-26 DIAGNOSIS — M25521 Pain in right elbow: Secondary | ICD-10-CM | POA: Diagnosis not present

## 2021-04-26 DIAGNOSIS — M170 Bilateral primary osteoarthritis of knee: Secondary | ICD-10-CM | POA: Diagnosis not present

## 2021-04-26 DIAGNOSIS — M1A09X1 Idiopathic chronic gout, multiple sites, with tophus (tophi): Secondary | ICD-10-CM

## 2021-04-26 MED ORDER — COLCHICINE 0.6 MG PO TABS: 0.6000 mg | ORAL_TABLET | Freq: Every day | ORAL | 0 refills | Status: DC

## 2021-04-26 MED ORDER — ALLOPURINOL 300 MG PO TABS: 450.0000 mg | ORAL_TABLET | Freq: Every day | ORAL | 0 refills | Status: DC

## 2021-04-26 NOTE — Patient Instructions (Signed)
Joint Steroid Injection A joint steroid injection is a procedure to relieve swelling and pain in a joint. Steroids are medicines that reduce inflammation. In this procedure, your health care provider uses a syringe and a needle to inject a steroid medicine into a painful and inflamed joint. A pain-relieving medicine (anesthetic) may be injected along with the steroid. In some cases, your health care provider may use an imaging technique such as ultrasound or fluoroscopy toguide the injection. Joints that are often treated with steroid injections include the knee, shoulder, hip, and spine. These injections may also be used in the elbow, ankle, and joints of the hands or feet. You may have joint steroid injections as part of your treatment for inflammation caused by: Gout. Rheumatoid arthritis. Advanced wear-and-tear arthritis (osteoarthritis). Tendinitis. Bursitis. Joint steroid injections may be repeated, but having them too often can damage a joint or the skin over the joint. You should not have joint steroidinjections less than 6 weeks apart or more than four times a year. Tell a health care provider about: Any allergies you have. All medicines you are taking, including vitamins, herbs, eye drops, creams, and over-the-counter medicines. Any problems you or family members have had with anesthetic medicines. Any blood disorders you have. Any surgeries you have had. Any medical conditions you have. Whether you are pregnant or may be pregnant. What are the risks? Generally, this is a safe treatment. However, problems may occur, including: Infection. Bleeding. Allergic reactions to medicines. Damage to the joint or tissues around the joint. Thinning of skin or loss of skin color over the joint. Temporary flushing of the face or chest. Temporary increase in pain. Temporary increase in blood sugar. Failure to relieve inflammation or pain. What happens before the treatment? Medicines Ask  your health care provider about: Changing or stopping your regular medicines. This is especially important if you are taking diabetes medicines or blood thinners. Taking medicines such as aspirin and ibuprofen. These medicines can thin your blood. Do not take these medicines unless your health care provider tells you to take them. Taking over-the-counter medicines, vitamins, herbs, and supplements. General instructions You may have imaging tests of your joint. Ask your health care provider if you can drive yourself home after the procedure. What happens during the treatment?  Your health care provider will position you for the injection and locate the injection site over your joint. The skin over the joint will be cleaned with a germ-killing soap. Your health care provider may: Spray a numbing solution (topical anesthetic) over the injection site. Inject a local anesthetic under the skin above your joint. The needle will be placed through your skin into your joint. Your health care provider may use imaging to guide the needle to the right spot for the injection. If imaging is used, a special contrast dye may be injected to confirm that the needle is in the correct location. The steroid medicine will be injected into your joint. Anesthetic may be injected along with the steroid. This may be a medicine that relieves pain for a short time (short-acting anesthetic) or for a longer time (long-acting anesthetic). The needle will be removed, and an adhesive bandage (dressing) will be placed over the injection site. The procedure may vary among health care providers and hospitals. What can I expect after the treatment? You will be able to go home after the treatment. It is normal to feel slight flushing for a few days after the injection. After the treatment, it is common to   have an increase in joint pain after the anesthetic has worn off. This may happen about an hour after a short-acting anesthetic  or about 8 hours after a longer-acting anesthetic. You should begin to feel relief from joint pain and swelling after 24 to 48 hours. Contact your health care provider if you do not begin to feel relief after 2 days. Follow these instructions at home: Injection site care Leave the adhesive dressing over your injection site in place until your health care provider says you can remove it. Check your injection site every day for signs of infection. Check for: More redness, swelling, or pain. Fluid or blood. Warmth. Pus or a bad smell. Activity Return to your normal activities as told by your health care provider. Ask your health care provider what activities are safe for you. You may be asked to limit activities that put stress on the joint for a few days. Do joint exercises as told by your health care provider. Do not take baths, swim, or use a hot tub until your health care provider approves. Ask your health care provider if you may take showers. You may only be allowed to take sponge baths. Managing pain, stiffness, and swelling  If directed, put ice on the joint. To do this: Put ice in a plastic bag. Place a towel between your skin and the bag. Leave the ice on for 20 minutes, 2-3 times a day. Remove the ice if your skin turns bright red. This is very important. If you cannot feel pain, heat, or cold, you have a greater risk of damage to the area. Raise (elevate) your joint above the level of your heart when you are sitting or lying down.  General instructions Take over-the-counter and prescription medicines only as told by your health care provider. Do not use any products that contain nicotine or tobacco, such as cigarettes, e-cigarettes, and chewing tobacco. These can delay joint healing. If you need help quitting, ask your health care provider. If you have diabetes, be aware that your blood sugar may be slightly elevated for several days after the injection. Keep all follow-up visits.  This is important. Contact a health care provider if you have: Chills or a fever. Any signs of infection at your injection site. Increased pain or swelling or no relief after 2 days. Summary A joint steroid injection is a treatment to relieve pain and swelling in a joint. Steroids are medicines that reduce inflammation. Your health care provider may add an anesthetic along with the steroid. You may have joint steroid injections as part of your arthritis treatment. Joint steroid injections may be repeated, but having them too often can damage a joint or the skin over the joint. Contact your health care provider if you have a fever, chills, or signs of infection, or if you get no relief from joint pain or swelling. This information is not intended to replace advice given to you by your health care provider. Make sure you discuss any questions you have with your healthcare provider. Document Revised: 01/09/2020 Document Reviewed: 01/09/2020 Elsevier Patient Education  2022 [employer]  

## 2021-04-27 LAB — COMPLETE METABOLIC PANEL WITH GFR
AG Ratio: 1.4 (calc) (ref 1.0–2.5)
ALT: 25 U/L (ref 9–46)
AST: 44 U/L — ABNORMAL HIGH (ref 10–35)
Albumin: 4.4 g/dL (ref 3.6–5.1)
Alkaline phosphatase (APISO): 71 U/L (ref 35–144)
BUN: 10 mg/dL (ref 7–25)
CO2: 23 mmol/L (ref 20–32)
Calcium: 9.8 mg/dL (ref 8.6–10.3)
Chloride: 105 mmol/L (ref 98–110)
Creat: 0.91 mg/dL (ref 0.70–1.30)
Globulin: 3.1 g/dL (calc) (ref 1.9–3.7)
Glucose, Bld: 91 mg/dL (ref 65–99)
Potassium: 3.7 mmol/L (ref 3.5–5.3)
Sodium: 140 mmol/L (ref 135–146)
Total Bilirubin: 0.8 mg/dL (ref 0.2–1.2)
Total Protein: 7.5 g/dL (ref 6.1–8.1)
eGFR: 98 mL/min/{1.73_m2} (ref >=60)

## 2021-04-27 LAB — URIC ACID: Uric Acid, Serum: 6.5 mg/dL (ref 4.0–8.0)

## 2021-04-27 NOTE — Progress Notes (Signed)
Uric acid is 6.5 still too high to improve the gout so he should increase the allopurinol to 1.5 tablets daily. He can continue using colchicine. The liver function test is slightly abnormal, we need to recheck this along with the uric acid in a month to make sure there is no problem with the medications.

## 2021-05-12 MED ORDER — LIDOCAINE HCL 1 % IJ SOLN
2.0000 mL | INTRAMUSCULAR | Status: AC | PRN
  Administered 2021-04-26: 2 mL

## 2021-05-12 MED ORDER — TRIAMCINOLONE ACETONIDE 40 MG/ML IJ SUSP
40.0000 mg | INTRAMUSCULAR | Status: AC | PRN
  Administered 2021-04-26: 40 mg via INTRA_ARTICULAR
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
## 2021-05-24 ENCOUNTER — Ambulatory Visit: Payer: Medicare Other | Admitting: Internal Medicine
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
## 2021-06-01 ENCOUNTER — Other Ambulatory Visit: Payer: Self-pay

## 2021-06-01 ENCOUNTER — Ambulatory Visit (INDEPENDENT_AMBULATORY_CARE_PROVIDER_SITE_OTHER): Payer: Medicare Other | Admitting: Internal Medicine

## 2021-06-01 ENCOUNTER — Encounter: Payer: Self-pay | Admitting: Internal Medicine

## 2021-06-01 VITALS — BP 132/76 | HR 77 | Resp 16 | Ht 74.0 in | Wt 249.0 lb

## 2021-06-01 DIAGNOSIS — M1A09X1 Idiopathic chronic gout, multiple sites, with tophus (tophi): Secondary | ICD-10-CM | POA: Diagnosis not present

## 2021-06-01 DIAGNOSIS — M25521 Pain in right elbow: Secondary | ICD-10-CM

## 2021-06-01 MED ORDER — ALLOPURINOL 300 MG PO TABS: 450.0000 mg | ORAL_TABLET | Freq: Every day | ORAL | 6 refills | Status: DC

## 2021-06-01 MED ORDER — COLCHICINE 0.6 MG PO TABS: 0.6000 mg | ORAL_TABLET | Freq: Every day | ORAL | 3 refills | Status: DC

## 2021-06-02 ENCOUNTER — Other Ambulatory Visit: Payer: Self-pay | Admitting: *Deleted

## 2021-06-02 MED ORDER — MITIGARE 0.6 MG PO CAPS: 0.6000 mg | ORAL_CAPSULE | Freq: Every day | ORAL | 3 refills | Status: AC

## 2021-06-02 NOTE — Telephone Encounter (Signed)
Started PA for Colchicine. Cover mymeds form states the formulary covered drug is Mitigare. Would you like to resend the prescription as Mitigare?

## 2021-06-02 NOTE — Telephone Encounter (Signed)
That should be fine for a substitute.

## 2021-07-09 ENCOUNTER — Encounter (HOSPITAL_COMMUNITY): Payer: Self-pay | Admitting: *Deleted

## 2021-07-09 ENCOUNTER — Emergency Department (HOSPITAL_COMMUNITY)
Admission: EM | Admit: 2021-07-09 | Discharge: 2021-07-09 | Disposition: A | Discharge status: Home / Self Care | Payer: Medicare Other | Attending: Emergency Medicine | Admitting: Emergency Medicine

## 2021-07-09 ENCOUNTER — Other Ambulatory Visit: Payer: Self-pay

## 2021-07-09 ENCOUNTER — Emergency Department (HOSPITAL_COMMUNITY): Payer: Medicare Other

## 2021-07-09 DIAGNOSIS — R109 Unspecified abdominal pain: Secondary | ICD-10-CM | POA: Diagnosis present

## 2021-07-09 DIAGNOSIS — Z96652 Presence of left artificial knee joint: Secondary | ICD-10-CM | POA: Insufficient documentation

## 2021-07-09 DIAGNOSIS — I10 Essential (primary) hypertension: Secondary | ICD-10-CM | POA: Diagnosis not present

## 2021-07-09 LAB — URINALYSIS, ROUTINE W REFLEX MICROSCOPIC
Glucose, UA: NEGATIVE mg/dL
Hgb urine dipstick: NEGATIVE
Ketones, ur: NEGATIVE mg/dL
Leukocytes,Ua: NEGATIVE
Nitrite: NEGATIVE
Specific Gravity, Urine: 1.015 (ref 1.005–1.030)
pH: 6.5 (ref 5.0–8.0)

## 2021-07-09 LAB — URINALYSIS, MICROSCOPIC (REFLEX)

## 2021-07-09 IMAGING — CT CT RENAL STONE PROTOCOL
2 of 4 series · 17 of 46 positions shown, 19 images · non-contrast
Comparison: None.

CLINICAL DATA: Right flank pain.

EXAM:
CT ABDOMEN AND PELVIS WITHOUT CONTRAST
TECHNIQUE: Multidetector CT imaging of the abdomen and pelvis was performed
following the standard protocol without IV contrast.

[Series 2: axial st · axial · 0.88mm/px · z∈[+746,+1231]mm · 14 of 109 slices shown, 16 images]
[im 6/109  soft-tissue]
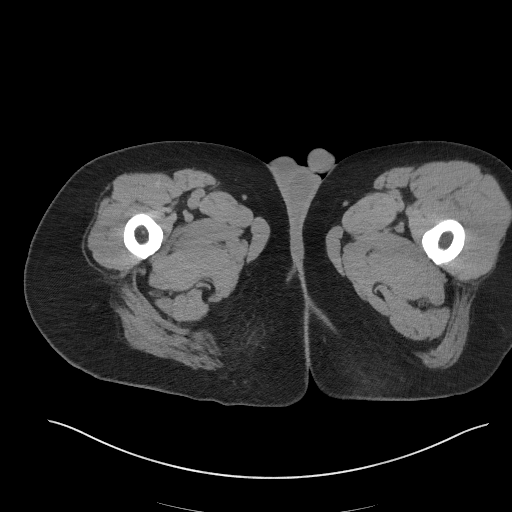
[im 6/109  bone]
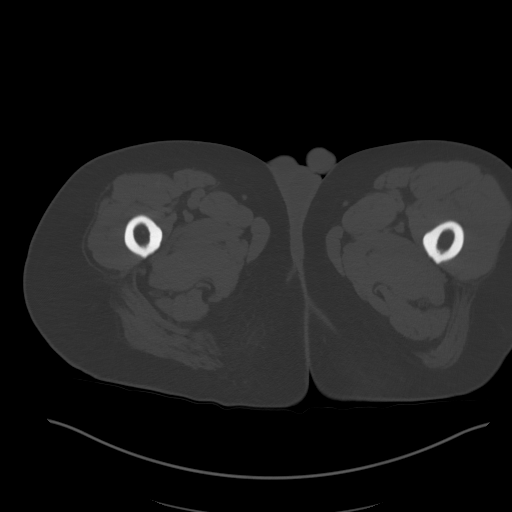
[im 16/109  soft-tissue]
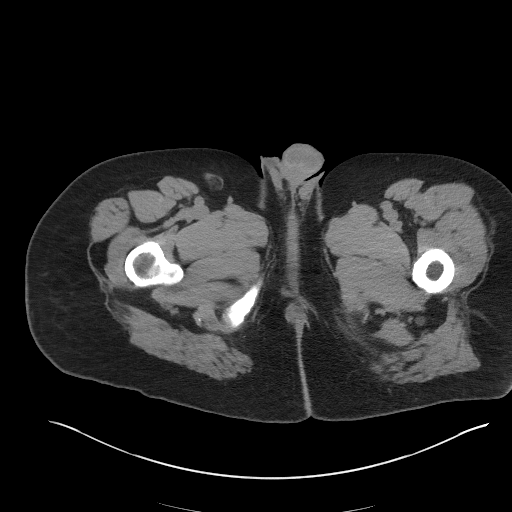
[im 21/109  soft-tissue]
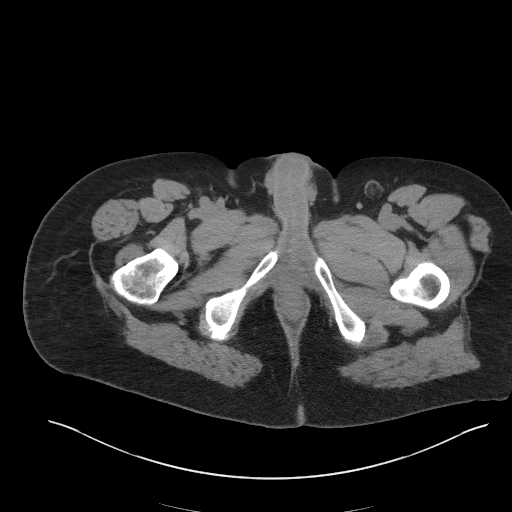
[im 31/109  soft-tissue]
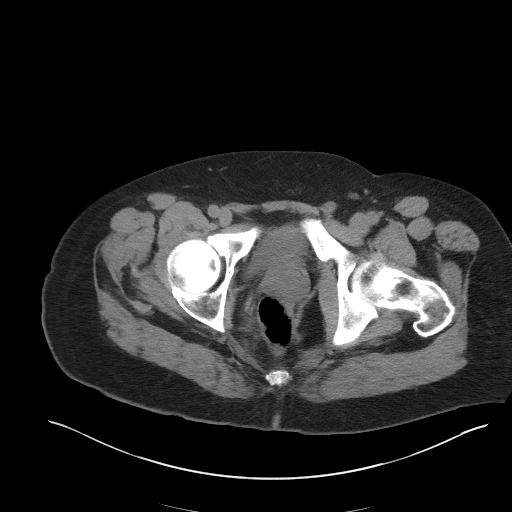
[im 37/109  soft-tissue]
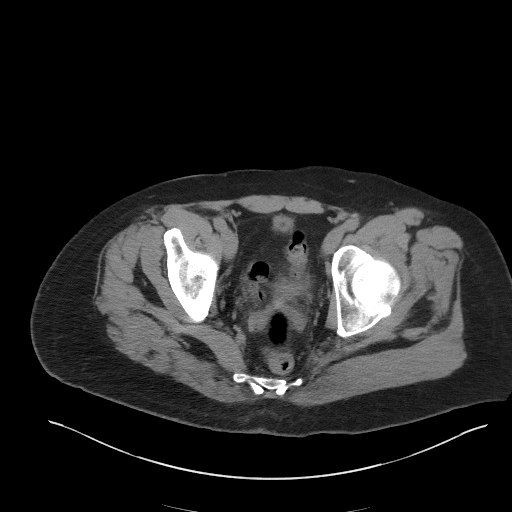
[im 42/109  soft-tissue]
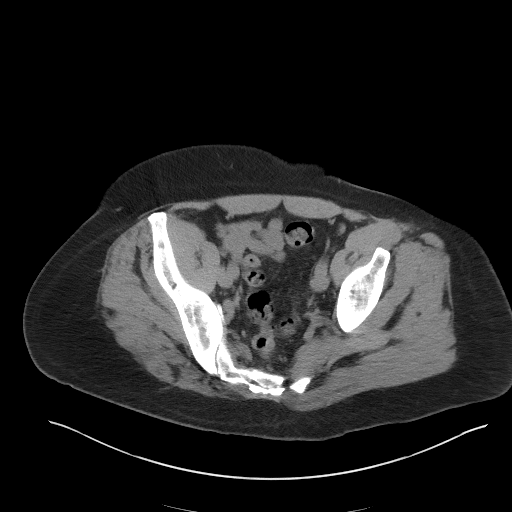
[im 52/109  soft-tissue]
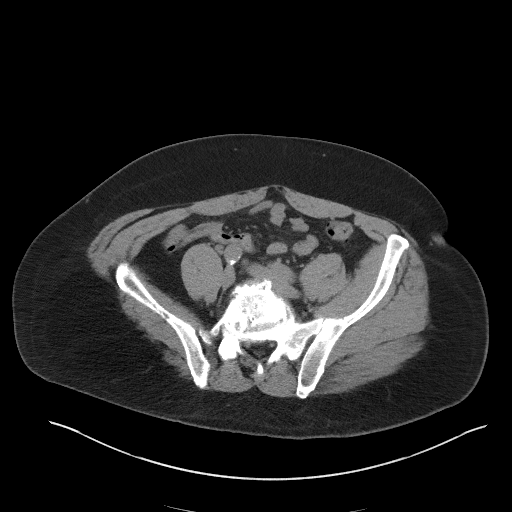
[im 57/109  soft-tissue]
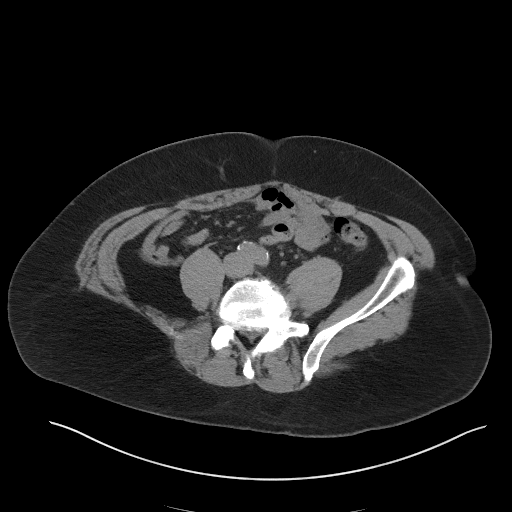
[im 67/109  soft-tissue]
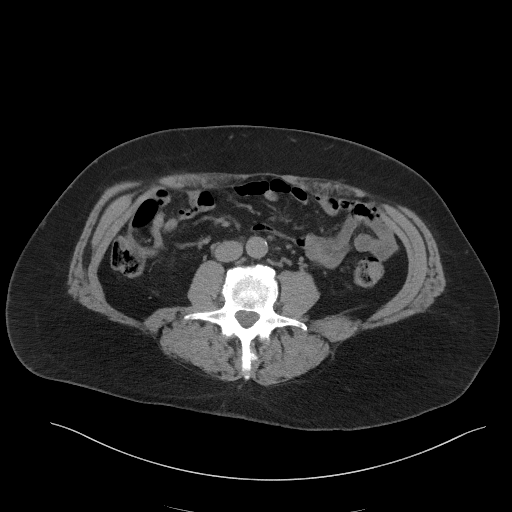
[im 67/109  bone]
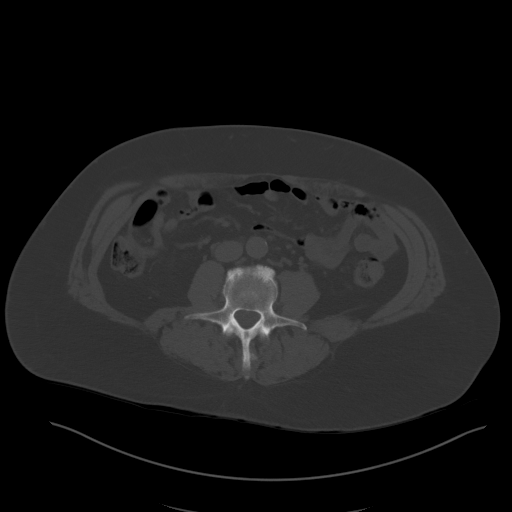
[im 73/109  soft-tissue]
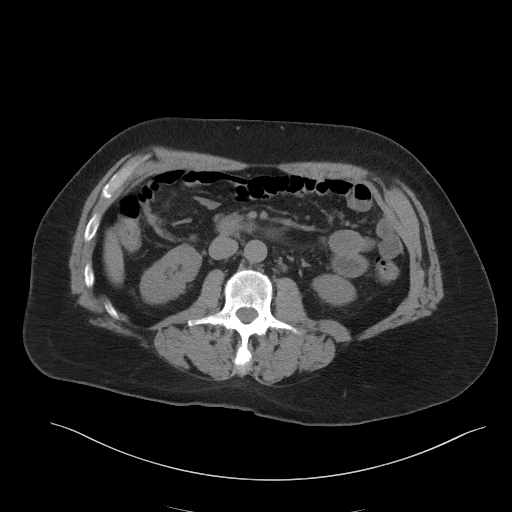
[im 83/109  soft-tissue]
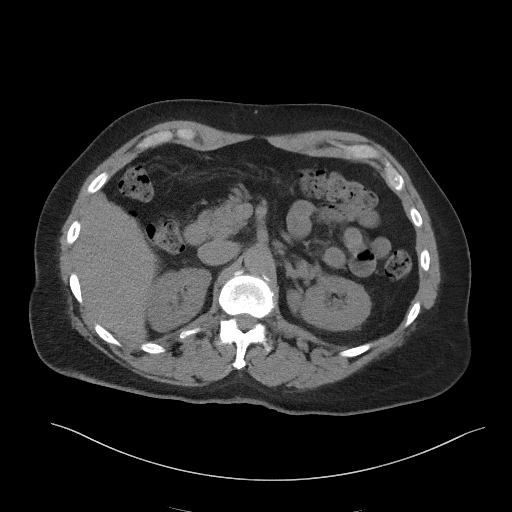
[im 88/109  soft-tissue]
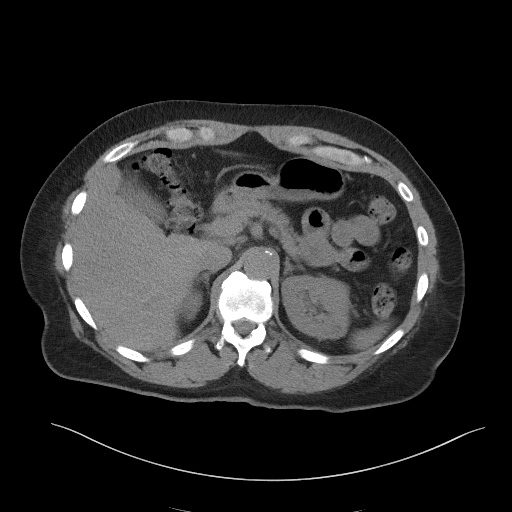
[im 93/109  soft-tissue]
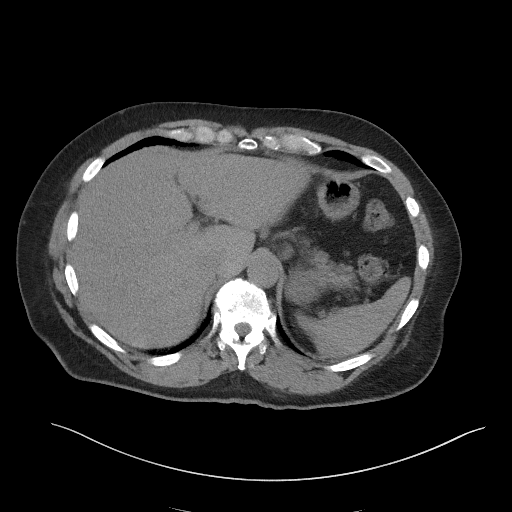
[im 103/109  soft-tissue]
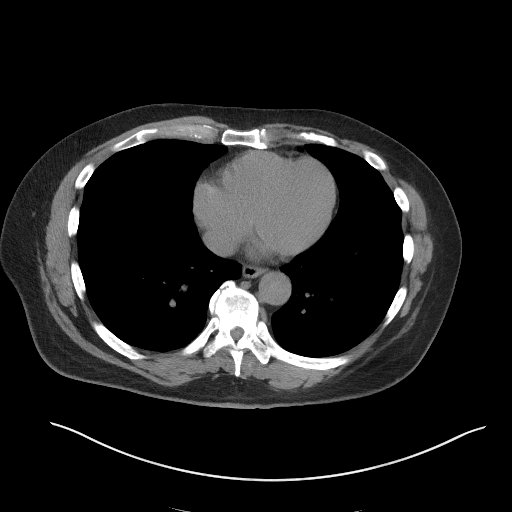

[Series 5: coronal st · coronal · 0.98mm/px · 3 of 110 slices shown]
[im 37/110  soft-tissue]
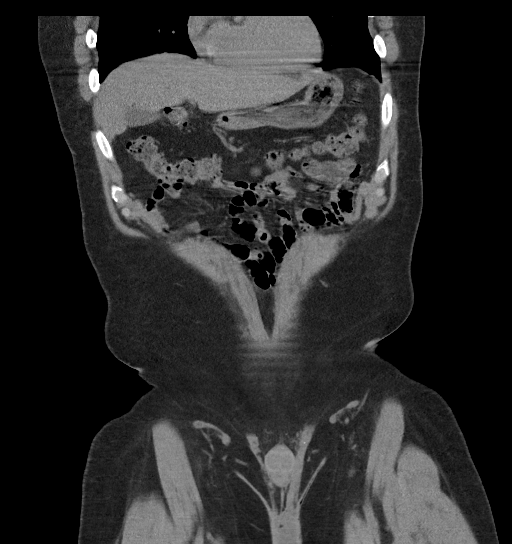
[im 49/110  soft-tissue]
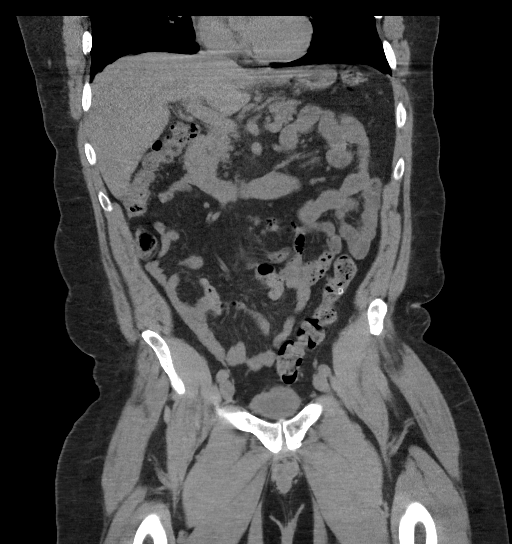
[im 61/110  soft-tissue]
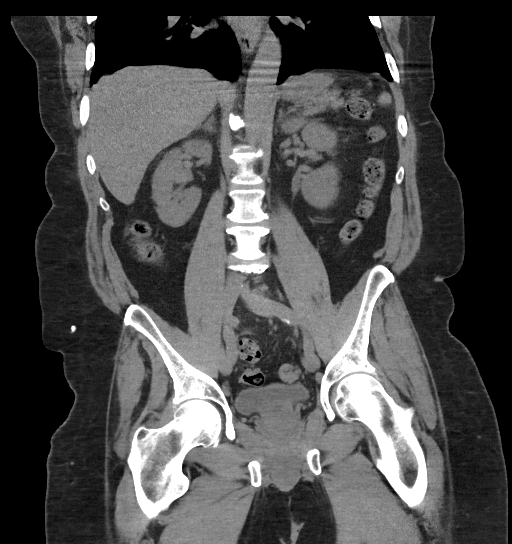

[17 of 46 positions shown; findings below may reference images not displayed]

FINDINGS: Lower chest: No acute abnormality.

Hepatobiliary: No focal liver abnormality is seen. No gallstones,
gallbladder wall thickening, or biliary dilatation.

Pancreas: Unremarkable. No pancreatic ductal dilatation or
surrounding inflammatory changes.

Spleen: Normal in size without focal abnormality.

Adrenals/Urinary Tract: Adrenal glands are unremarkable. Kidneys are
normal, without renal calculi, focal lesion, or hydronephrosis.
Bladder is unremarkable.

Stomach/Bowel: Stomach is within normal limits. Appendix appears
normal. No evidence of bowel wall thickening, distention, or
inflammatory changes.

Vascular/Lymphatic: Aortic atherosclerosis. No enlarged abdominal or
pelvic lymph nodes.

Reproductive: Prostate is unremarkable.

Other: No abdominal wall hernia or abnormality. No abdominopelvic
ascites.

Musculoskeletal: No acute or significant osseous findings.
IMPRESSION: No acute or active process within the abdomen or pelvis.

## 2021-07-09 MED ORDER — HYDROMORPHONE HCL 1 MG/ML IJ SOLN
1.0000 mg | Freq: Once | INTRAMUSCULAR | Status: AC
  Administered 2021-07-09: 1 mg via INTRAMUSCULAR
  Filled 2021-07-09: qty 1

## 2021-07-09 MED ORDER — METHOCARBAMOL 500 MG PO TABS: 500.0000 mg | ORAL_TABLET | Freq: Three times a day (TID) | ORAL | 0 refills | Status: DC

## 2021-07-09 NOTE — ED Triage Notes (Signed)
Pt with right flank pain since Thursday night.  Denies any N/V or fevers.  Denies any blood in urine or pain with urination.

## 2021-07-09 NOTE — ED Notes (Signed)
Transported to CT 

## 2021-07-09 NOTE — ED Notes (Signed)
Patient left ED with ABCs intact, alert and oriented x4, respirations even and unlabored. Discharge instructions reviewed and all questions answered.   
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
## 2021-07-09 NOTE — Discharge Instructions (Signed)
The CT scan of your kidneys this evening did not show evidence of a kidney stone.  Your urine did not show evidence of an infection.  Your symptoms are likely muscular.  Recommend that you alternate ice and heat to your back.  Continue your pain medication as directed.  You may add muscle relaxer and take as directed.  Follow-up with your primary care provider for recheck.  Return to the emergency department for any new or worsening symptoms.

## 2021-07-14 ENCOUNTER — Emergency Department (HOSPITAL_COMMUNITY)
Admission: EM | Admit: 2021-07-14 | Discharge: 2021-07-14 | Disposition: A | Discharge status: Home / Self Care | Payer: Medicare Other | Attending: Emergency Medicine | Admitting: Emergency Medicine

## 2021-07-14 ENCOUNTER — Emergency Department (HOSPITAL_COMMUNITY): Payer: Medicare Other

## 2021-07-14 ENCOUNTER — Encounter (HOSPITAL_COMMUNITY): Payer: Self-pay | Admitting: *Deleted

## 2021-07-14 DIAGNOSIS — Y999 Unspecified external cause status: Secondary | ICD-10-CM | POA: Diagnosis not present

## 2021-07-14 DIAGNOSIS — S299XXA Unspecified injury of thorax, initial encounter: Secondary | ICD-10-CM | POA: Diagnosis present

## 2021-07-14 DIAGNOSIS — F1210 Cannabis abuse, uncomplicated: Secondary | ICD-10-CM | POA: Insufficient documentation

## 2021-07-14 DIAGNOSIS — S20211A Contusion of right front wall of thorax, initial encounter: Secondary | ICD-10-CM | POA: Insufficient documentation

## 2021-07-14 DIAGNOSIS — W109XXA Fall (on) (from) unspecified stairs and steps, initial encounter: Secondary | ICD-10-CM | POA: Insufficient documentation

## 2021-07-14 DIAGNOSIS — Y939 Activity, unspecified: Secondary | ICD-10-CM | POA: Diagnosis not present

## 2021-07-14 DIAGNOSIS — Z79899 Other long term (current) drug therapy: Secondary | ICD-10-CM | POA: Diagnosis not present

## 2021-07-14 DIAGNOSIS — Y929 Unspecified place or not applicable: Secondary | ICD-10-CM | POA: Diagnosis not present

## 2021-07-14 DIAGNOSIS — Z96652 Presence of left artificial knee joint: Secondary | ICD-10-CM | POA: Insufficient documentation

## 2021-07-14 DIAGNOSIS — I10 Essential (primary) hypertension: Secondary | ICD-10-CM | POA: Insufficient documentation

## 2021-07-14 IMAGING — CR DG CHEST 2V
2 series · 2 of 2 positions shown · non-contrast
Comparison: [DATE]

CLINICAL DATA: Fell down the steps 2 days ago.  Chest pain.

EXAM:
CHEST - 2 VIEW

[w pa chest]
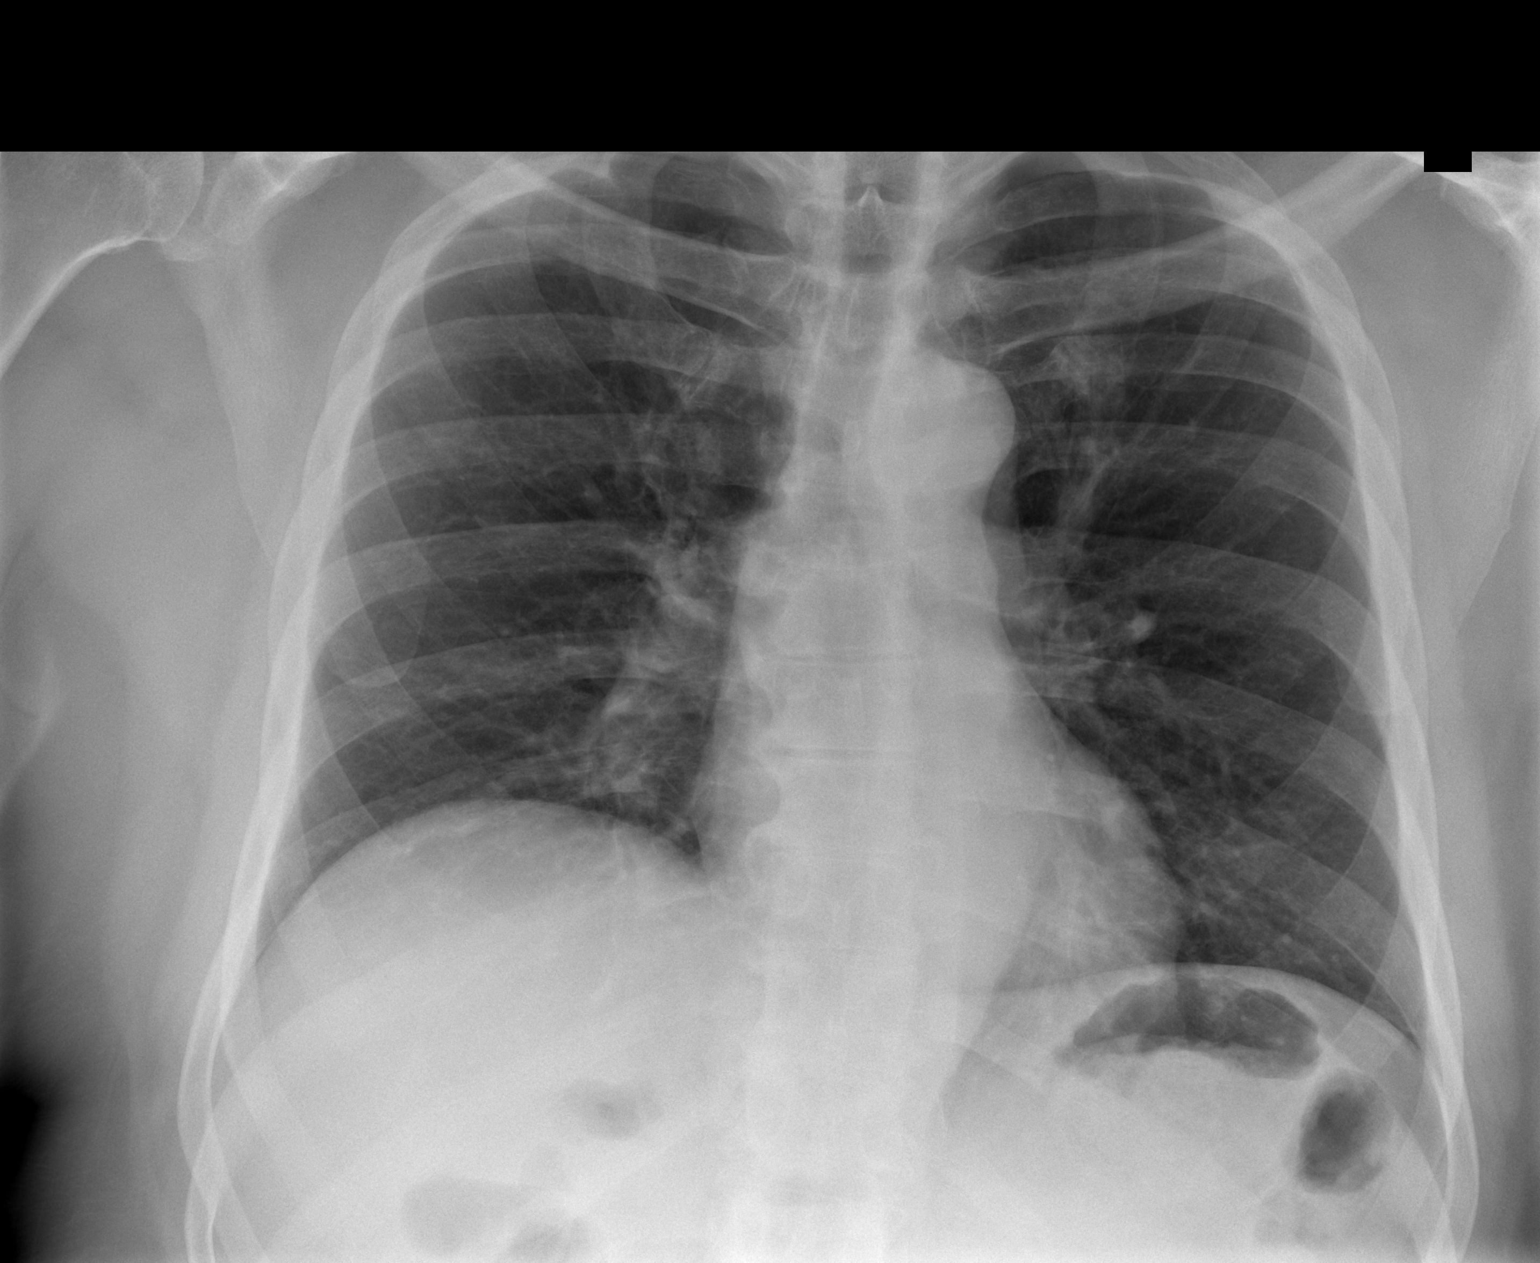

[w chest lat]
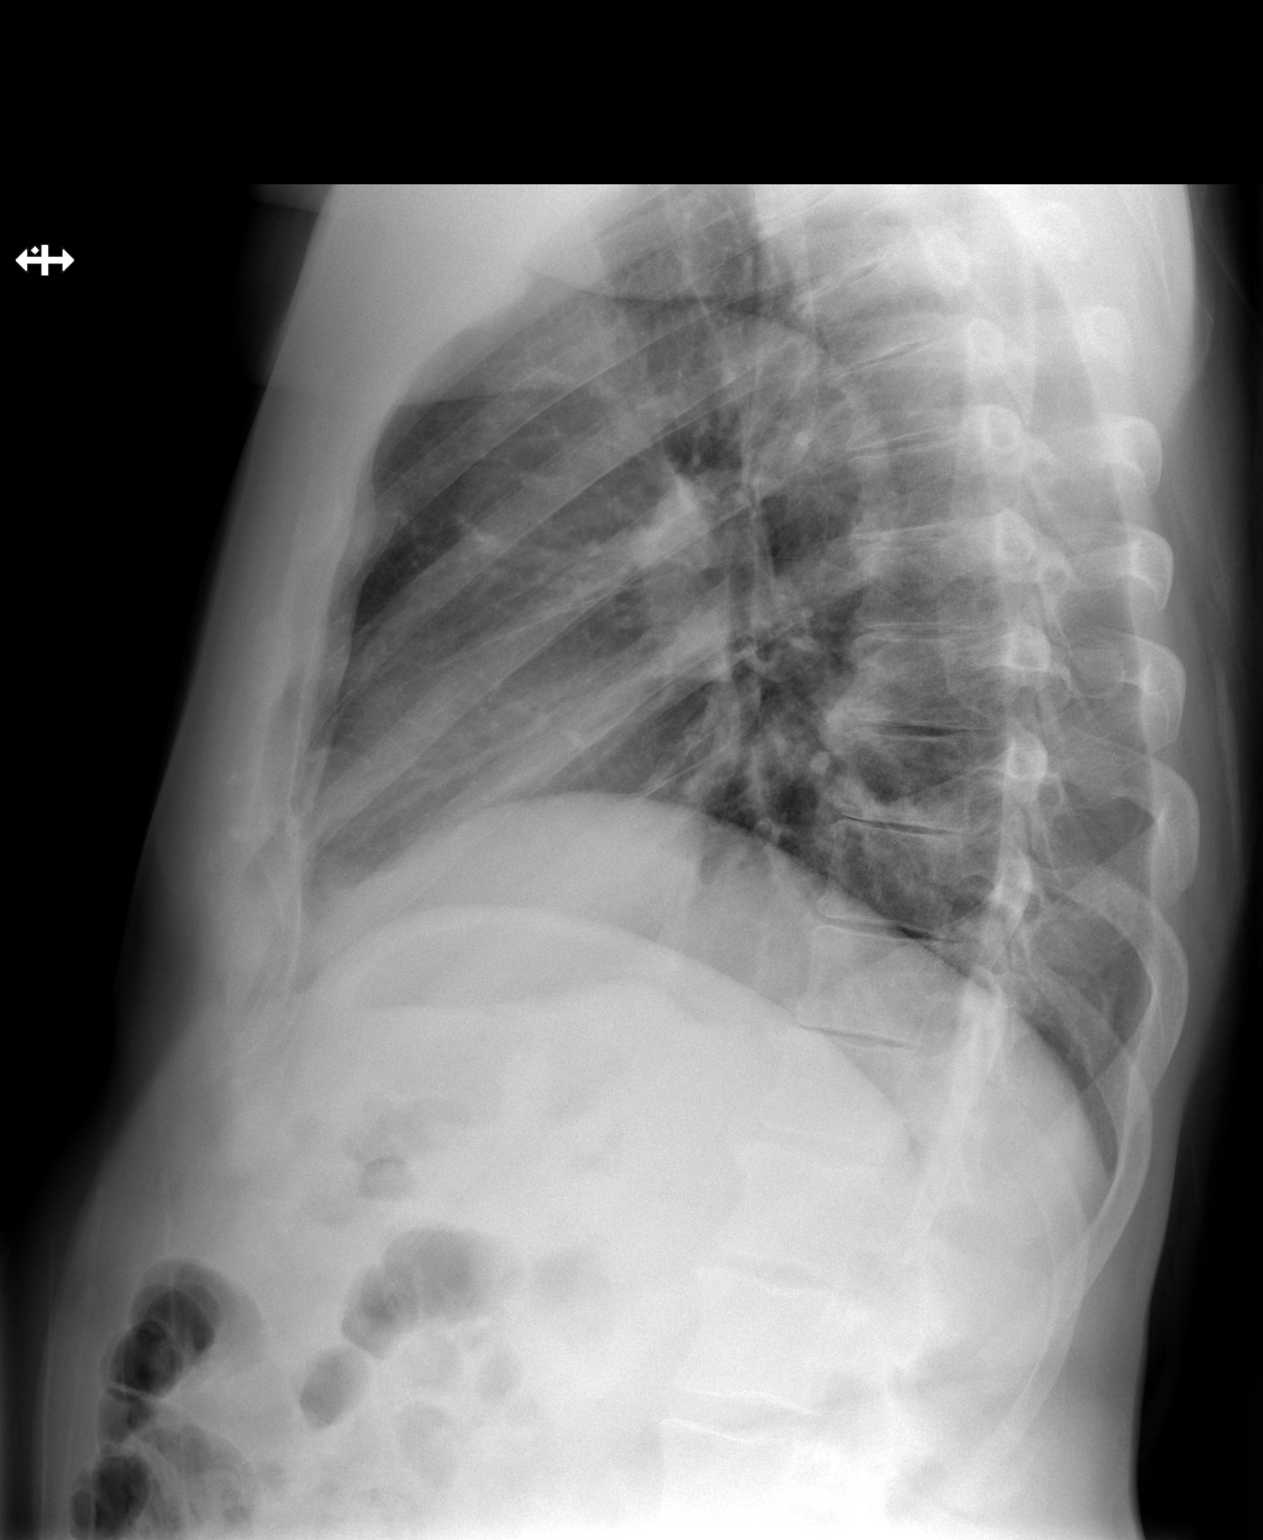

[2 of 2 positions shown; findings below may reference images not displayed]

FINDINGS: Heart and mediastinal shadows are normal. The lungs are clear. No
pneumothorax or hemothorax. No visible rib fracture. Ordinary
chronic degenerative changes affect the spine.
IMPRESSION: No active cardiopulmonary disease.
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
## 2021-07-14 NOTE — ED Notes (Signed)
Patient transported to X-ray 

## 2021-07-14 NOTE — Discharge Instructions (Addendum)
Your x-ray today was negative for fracture. You may use 600 mg Ibuprofen or 1,000 mg Tylenol every 6 hours as needed for pain.  Attached are instructions on how to use the incentive spirometer, it is important that you use it at least 10 times every hour while awake.  You may follow-up with a primary care provider as needed.  Return to the emergency department if you are experiencing increasing/worsening shortness of breath, chest pain, or worsening symptoms.

## 2021-07-14 NOTE — ED Triage Notes (Signed)
Fell 2 days ago coming down steps, pain in right rib cage

## 2021-07-17 ENCOUNTER — Emergency Department (HOSPITAL_COMMUNITY)
Admission: EM | Admit: 2021-07-17 | Discharge: 2021-07-17 | Disposition: A | Discharge status: Home / Self Care | Payer: Medicare Other | Attending: Emergency Medicine | Admitting: Emergency Medicine

## 2021-07-17 ENCOUNTER — Encounter (HOSPITAL_COMMUNITY): Payer: Self-pay | Admitting: Emergency Medicine

## 2021-07-17 DIAGNOSIS — M109 Gout, unspecified: Secondary | ICD-10-CM | POA: Diagnosis present

## 2021-07-17 DIAGNOSIS — Z96652 Presence of left artificial knee joint: Secondary | ICD-10-CM | POA: Insufficient documentation

## 2021-07-17 MED ORDER — DEXAMETHASONE SODIUM PHOSPHATE 10 MG/ML IJ SOLN
8.0000 mg | Freq: Once | INTRAMUSCULAR | Status: AC
  Administered 2021-07-17: 08:00:00 8 mg via INTRAMUSCULAR
  Filled 2021-07-17: qty 1
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
## 2021-07-17 NOTE — ED Triage Notes (Signed)
Pt here from home with c/o a gout flair up in his right foot , pt doesn't think that he has missed any of his meds

## 2021-07-17 NOTE — Discharge Instructions (Addendum)
Return for any new or worse symptoms.  Continue take your pain medication as needed.

## 2021-07-21 ENCOUNTER — Encounter (HOSPITAL_COMMUNITY): Payer: Self-pay | Admitting: *Deleted

## 2021-07-21 ENCOUNTER — Emergency Department (HOSPITAL_COMMUNITY): Payer: Medicare Other

## 2021-07-21 ENCOUNTER — Emergency Department (HOSPITAL_COMMUNITY)
Admission: EM | Admit: 2021-07-21 | Discharge: 2021-07-21 | Disposition: A | Discharge status: Home / Self Care | Payer: Medicare Other | Attending: Emergency Medicine | Admitting: Emergency Medicine

## 2021-07-21 ENCOUNTER — Other Ambulatory Visit: Payer: Self-pay

## 2021-07-21 DIAGNOSIS — Z96652 Presence of left artificial knee joint: Secondary | ICD-10-CM | POA: Insufficient documentation

## 2021-07-21 DIAGNOSIS — Y9389 Activity, other specified: Secondary | ICD-10-CM | POA: Insufficient documentation

## 2021-07-21 DIAGNOSIS — Z79899 Other long term (current) drug therapy: Secondary | ICD-10-CM | POA: Diagnosis not present

## 2021-07-21 DIAGNOSIS — I10 Essential (primary) hypertension: Secondary | ICD-10-CM | POA: Diagnosis not present

## 2021-07-21 DIAGNOSIS — M109 Gout, unspecified: Secondary | ICD-10-CM | POA: Diagnosis not present

## 2021-07-21 DIAGNOSIS — M7031 Other bursitis of elbow, right elbow: Secondary | ICD-10-CM | POA: Diagnosis not present

## 2021-07-21 DIAGNOSIS — M25521 Pain in right elbow: Secondary | ICD-10-CM | POA: Diagnosis present

## 2021-07-21 IMAGING — DX DG ELBOW 2V*R*
2 series · 2 of 2 positions shown · non-contrast
Comparison: None.

CLINICAL DATA: Pain

EXAM:
RIGHT ELBOW - 2 VIEW

[elbow ap]
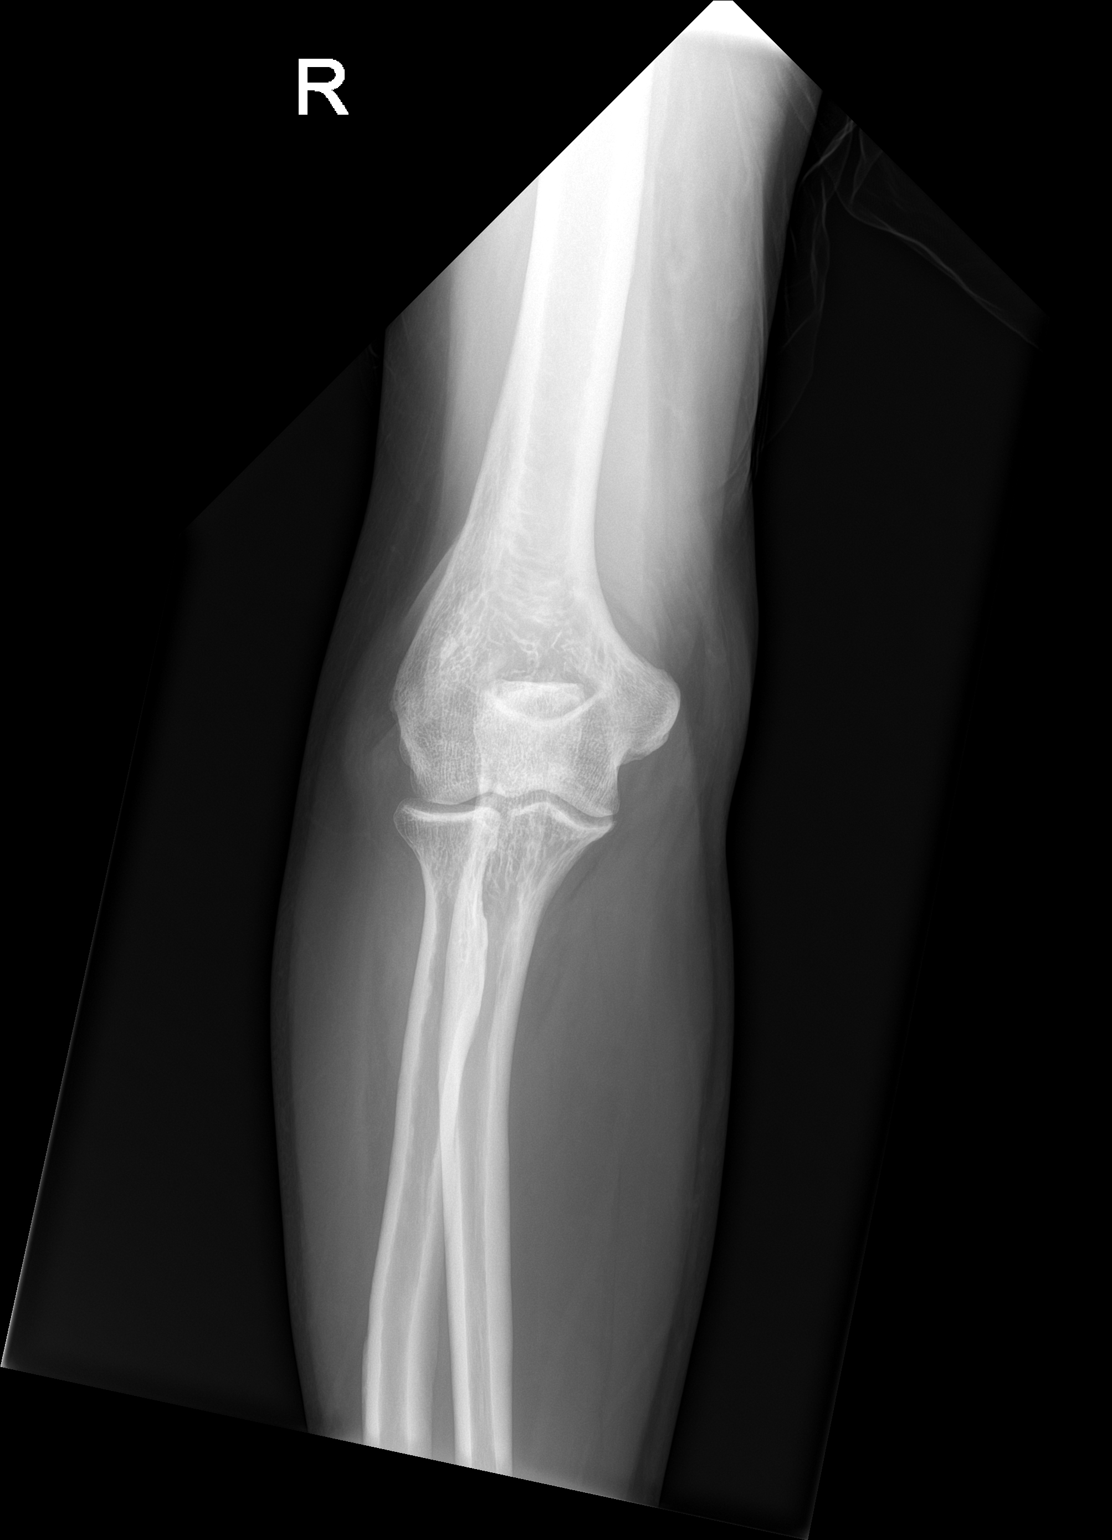

[elbow lat]
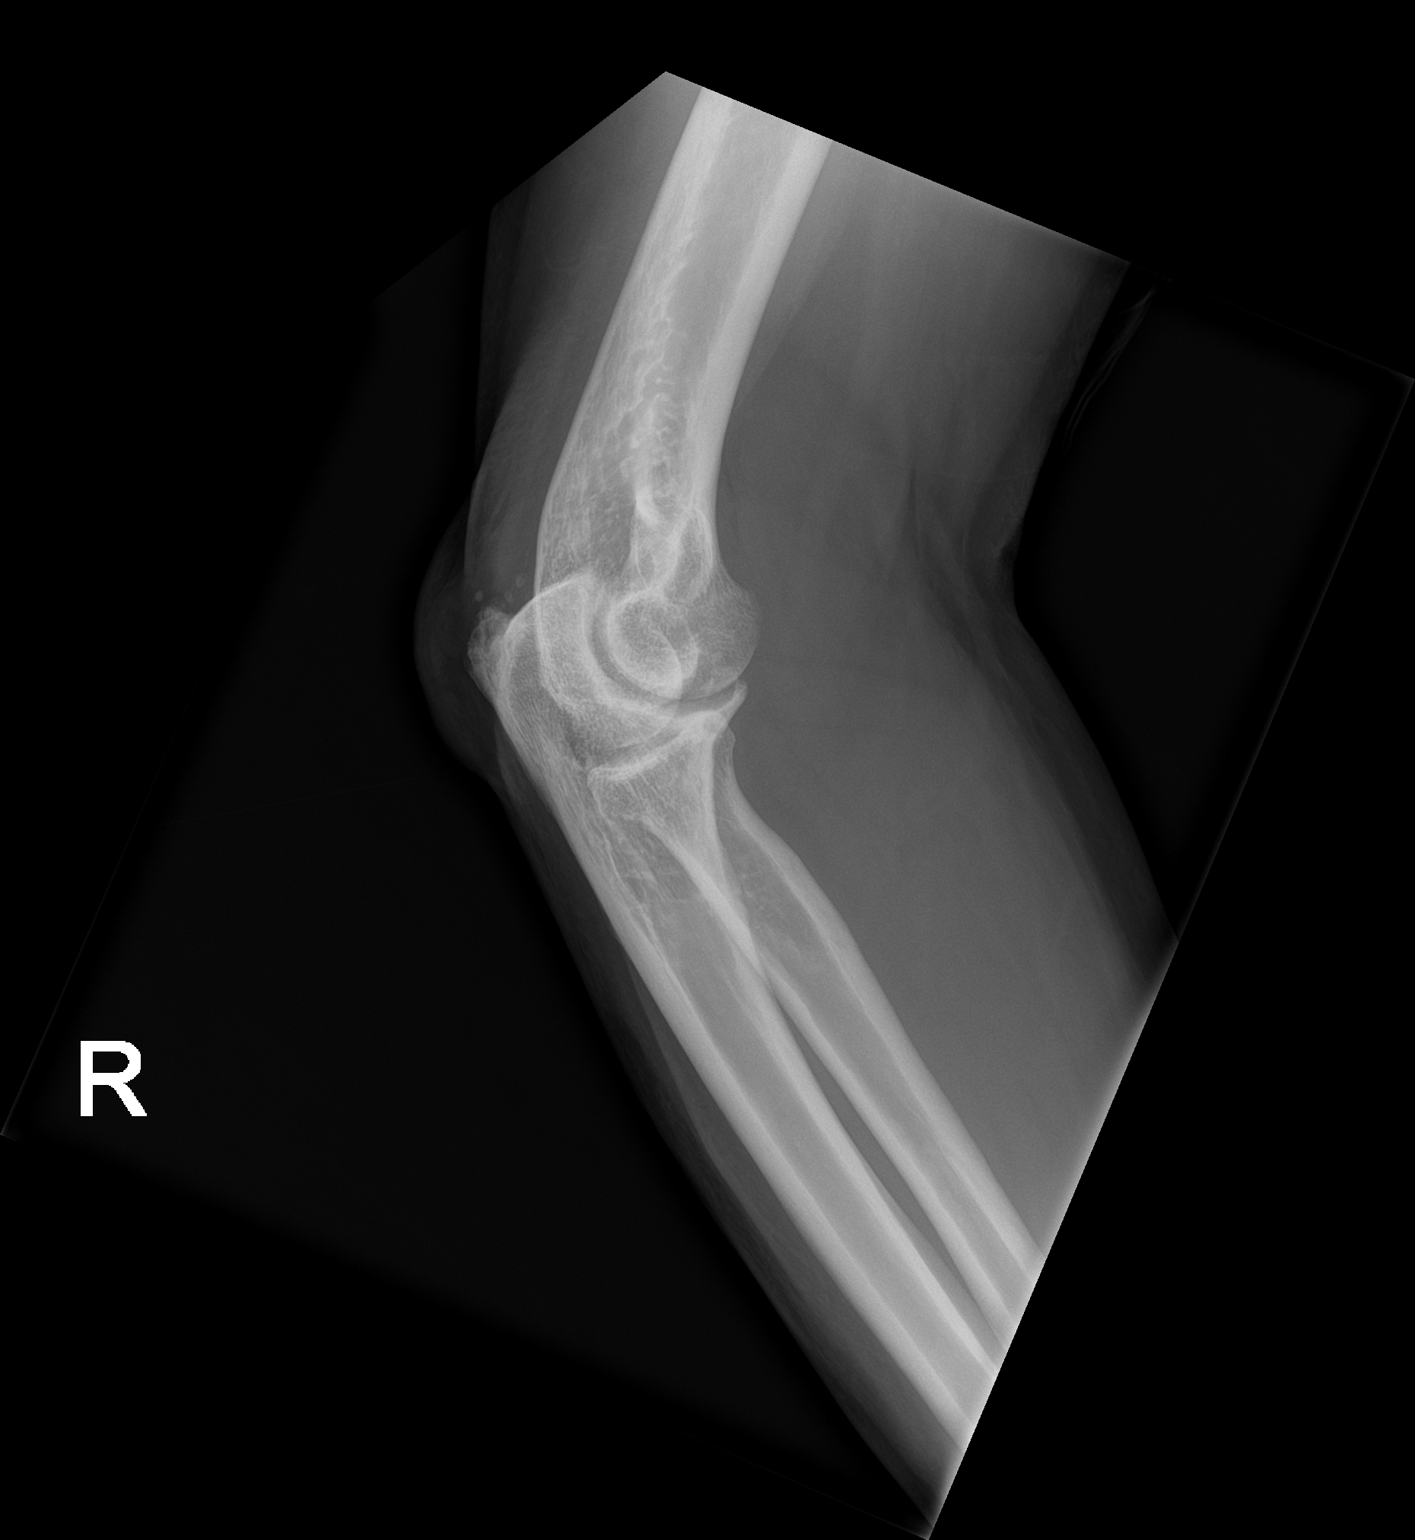

[2 of 2 positions shown; findings below may reference images not displayed]

FINDINGS: No recent fracture or dislocation is seen. There is no significant
effusion. There small smooth marginated calcifications adjacent to
olecranon process of proximal ulna.
IMPRESSION: No fracture or dislocation is seen. Small smooth marginated
calcifications in the soft tissues adjacent to the olecranon process
may suggest calcific bursitis or tendinosis.

## 2021-07-21 MED ORDER — KETOROLAC TROMETHAMINE 60 MG/2ML IM SOLN
60.0000 mg | Freq: Once | INTRAMUSCULAR | Status: AC
  Administered 2021-07-21: 60 mg via INTRAMUSCULAR
  Filled 2021-07-21: qty 2

## 2021-07-21 NOTE — ED Triage Notes (Signed)
Pt c/o gout flare up in right elbow x 2 days.

## 2021-07-21 NOTE — ED Notes (Signed)
Pt educated on staying to ensure he does not have a reaction to the IM Toradol. Pt verbalized understanding and stated he would let nurse know if he had any symptoms.
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
## 2021-07-21 NOTE — Discharge Instructions (Addendum)
Please resume taking your home meloxicam and hold off on the muscle relaxer medications. This should help improve your elbow pain associated with likely gout flair.

## 2021-10-13 ENCOUNTER — Telehealth: Payer: Self-pay

## 2021-10-27 ENCOUNTER — Ambulatory Visit (INDEPENDENT_AMBULATORY_CARE_PROVIDER_SITE_OTHER): Payer: Medicare Other

## 2021-10-27 ENCOUNTER — Other Ambulatory Visit: Payer: Self-pay

## 2021-10-27 ENCOUNTER — Ambulatory Visit (INDEPENDENT_AMBULATORY_CARE_PROVIDER_SITE_OTHER): Payer: Medicare Other | Admitting: Orthopedic Surgery

## 2021-10-27 ENCOUNTER — Encounter: Payer: Self-pay | Admitting: Orthopedic Surgery

## 2021-10-27 VITALS — BP 121/86 | HR 84 | Ht 74.0 in | Wt 246.0 lb

## 2021-10-27 DIAGNOSIS — G8929 Other chronic pain: Secondary | ICD-10-CM | POA: Diagnosis not present

## 2021-10-27 DIAGNOSIS — M25511 Pain in right shoulder: Secondary | ICD-10-CM

## 2021-10-27 NOTE — Progress Notes (Signed)
Chief Complaint  ?Patient presents with  ? Shoulder Pain  ?  RT/ states he should have already had a RCR and hasn't had it done. ?Ready to schedule RCR  ? ?Tyler Hart was previously scheduled for rotator cuff repair but it had to be canceled ? ?He is undergoing chronic pain management with [facility] medical and he is on oxycodone 10 mg 4 times a day is what is listed but he says he is on 7.5 mg 4 times a day ? ?In any event he comes in with a painful weak shoulder with decreased range of motion ? ?His last MRI was 2021 he had some muscle atrophy at that time he also had torn rotator cuff with full-thickness tear of the supraspinatus tendon he had 2.2 cm of retraction ? ?His AC joint had mild to moderate degenerative arthritis ? ?I took a new x-ray of him today he does not have any glenoid wear he does have some mild superior joint space narrowing ? ?He needs to have a new MRI to reassess the shoulder for possible repair ? ?I also discussed with him the issue of taking prednisone and healing of a cuff repair as well as the postop pain management issues that we are likely to face and this will have to be discussed with his pain management specialist the ideal situation would be to taper him off of the oxycodone and use an alternative method of pain management and then after 30 days to try to do the surgery ?

## 2021-10-27 NOTE — Patient Instructions (Signed)
While we are working on your approval for MRI please go ahead and call to schedule your appointment with [facility] within at least one (1) week.   Central Scheduling [phone]  

## 2021-11-08 ENCOUNTER — Encounter: Payer: Self-pay | Admitting: *Deleted

## 2021-11-14 ENCOUNTER — Ambulatory Visit (HOSPITAL_COMMUNITY)
Admission: RE | Admit: 2021-11-14 | Discharge: 2021-11-14 | Disposition: A | Discharge status: Home / Self Care | Payer: Medicare Other | Source: Ambulatory Visit | Attending: Orthopedic Surgery | Admitting: Orthopedic Surgery

## 2021-11-14 DIAGNOSIS — G8929 Other chronic pain: Secondary | ICD-10-CM | POA: Diagnosis present

## 2021-11-14 DIAGNOSIS — M25511 Pain in right shoulder: Secondary | ICD-10-CM | POA: Diagnosis present

## 2021-11-14 IMAGING — MR MR SHOULDER*R* W/O CM
5 series · 40 of 40 positions shown · non-contrast
Comparison: MR shoulder [DATE]; X-ray shoulder [DATE].

CLINICAL DATA: Chronic right shoulder pain for years. Clinical
concern for rotator cuff disorder.

EXAM:
MRI OF THE RIGHT SHOULDER WITHOUT CONTRAST
TECHNIQUE: Multiplanar, multisequence MR imaging of the shoulder was performed.
No intravenous contrast was administered.

[Series 5: T2 fat-sat · axial · right · 4.0mm · 0.47mm/px · z∈[-70,+50]mm · 8 of 26 slices shown (1 of 3)]
[im 1/26]
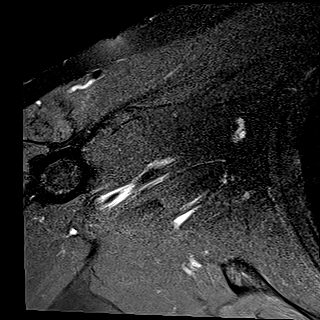
[im 4/26]
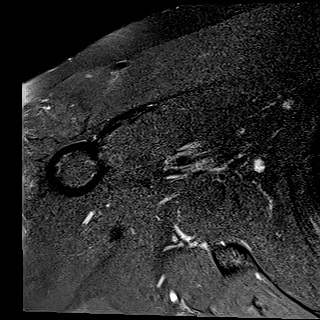
[im 8/26]
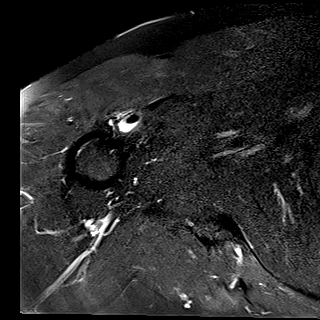
[im 11/26]
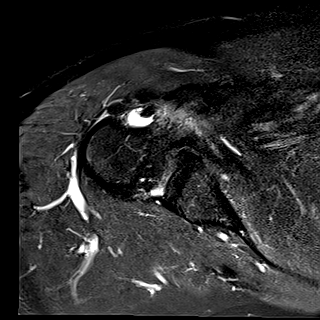
[im 15/26]
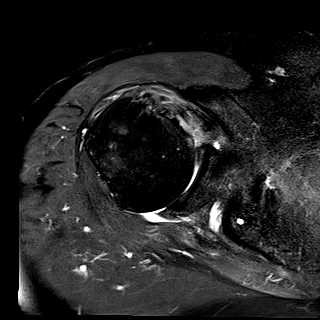
[im 18/26]
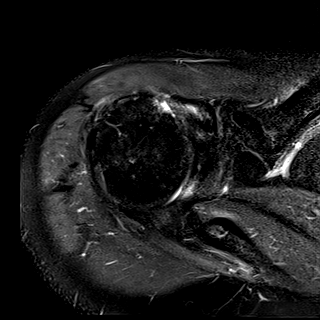
[im 22/26]
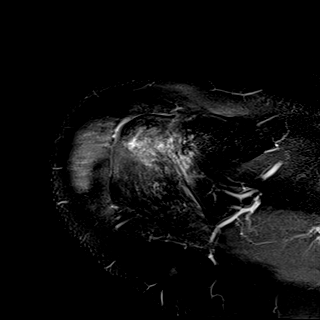
[im 26/26]
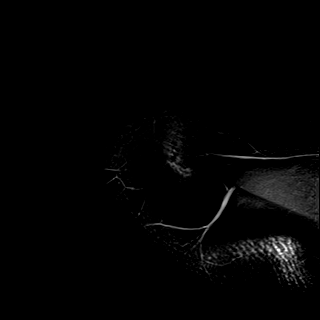

[Series 6: T2 fat-sat · oblique · right · 4.0mm · 0.47mm/px · 8 of 26 slices shown (2 of 3)]
[im 1/26]
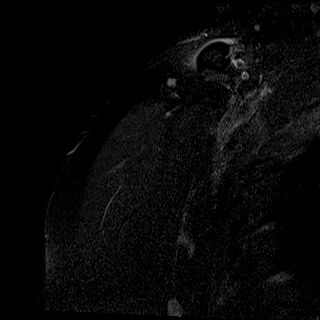
[im 4/26]
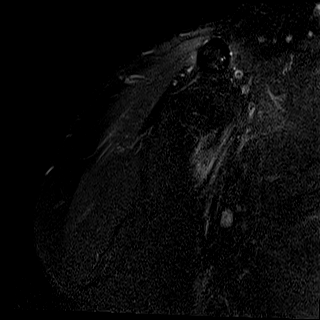
[im 8/26]
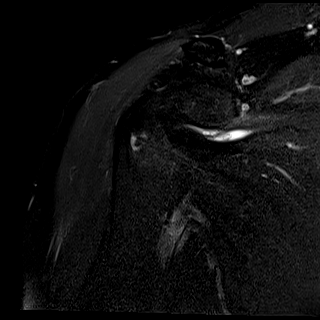
[im 11/26]
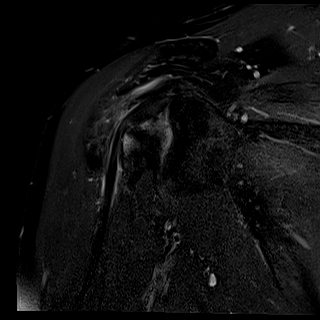
[im 15/26]
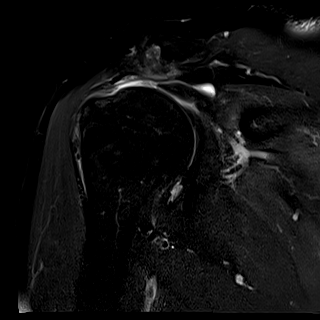
[im 18/26]
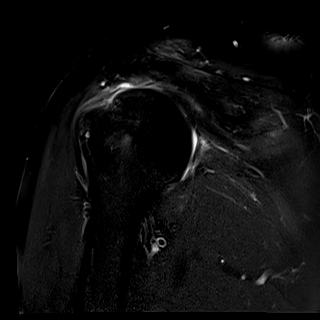
[im 22/26]
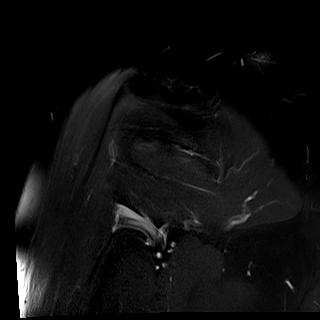
[im 26/26]
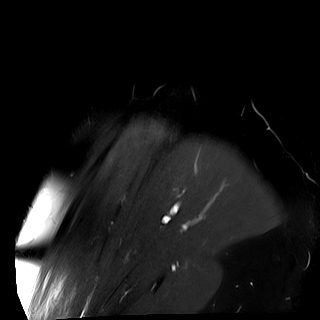

[Series 7: PD · oblique · right · 4.0mm · 0.47mm/px · 8 of 26 slices shown]
[im 1/26]
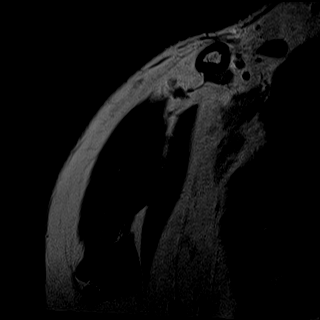
[im 4/26]
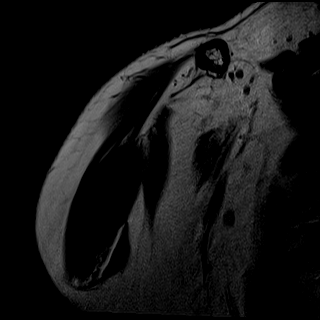
[im 8/26]
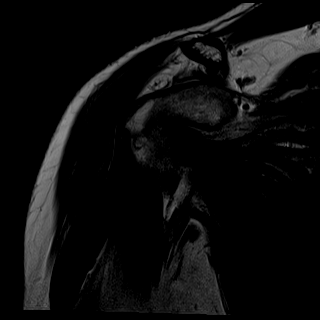
[im 11/26]
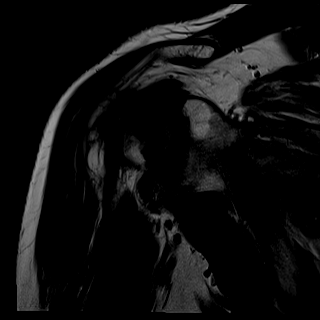
[im 15/26]
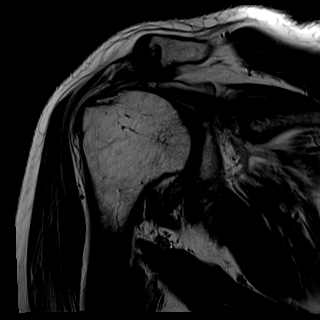
[im 18/26]
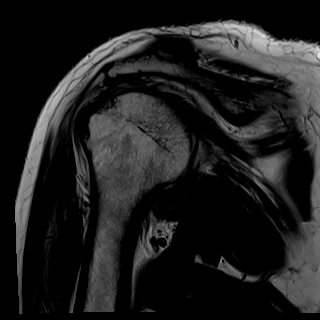
[im 22/26]
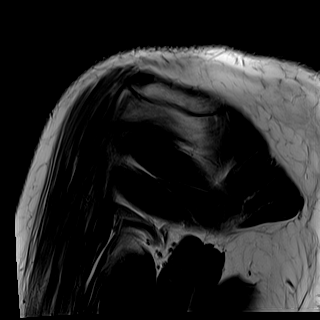
[im 26/26]
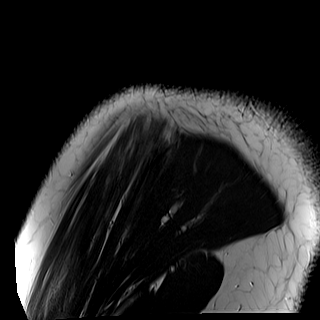

[Series 8: T2 fat-sat · oblique · right · 4.0mm · 0.44mm/px · 8 of 25 slices shown (3 of 3)]
[im 1/25]
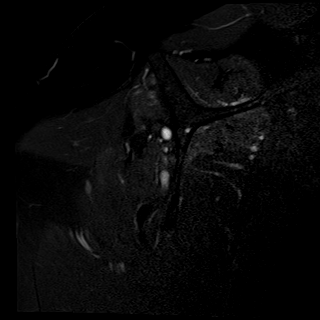
[im 4/25]
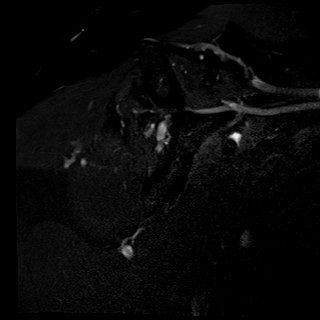
[im 7/25]
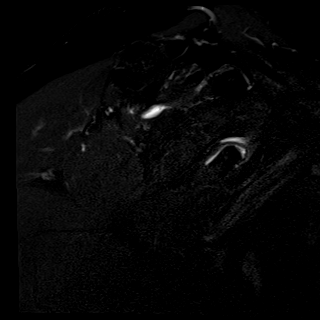
[im 11/25]
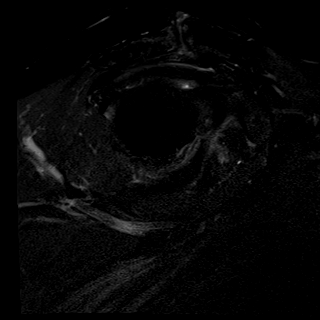
[im 14/25]
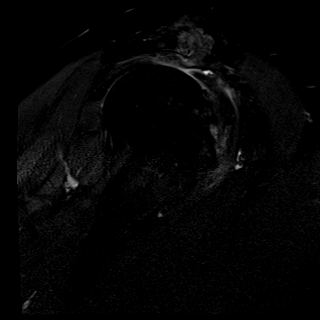
[im 18/25]
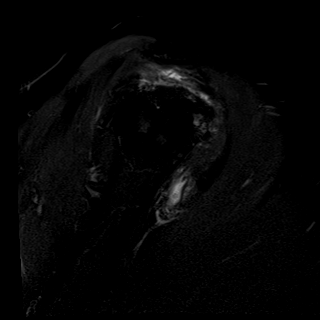
[im 21/25]
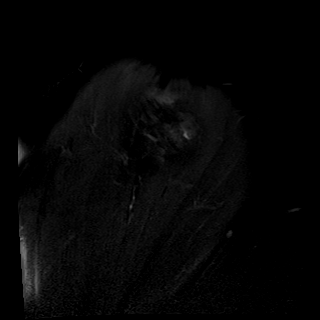
[im 25/25]
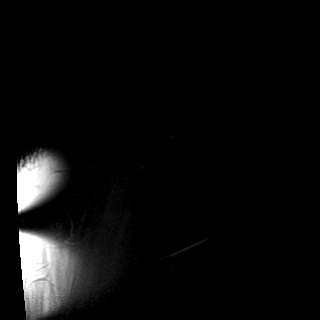

[Series 9: T1 · oblique · right · 4.0mm · 0.41mm/px · 8 of 25 slices shown]
[im 1/25]
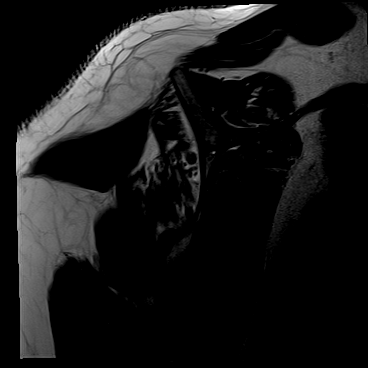
[im 4/25]
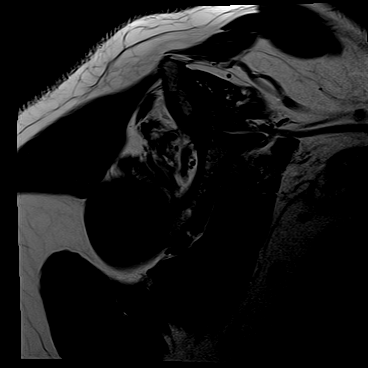
[im 7/25]
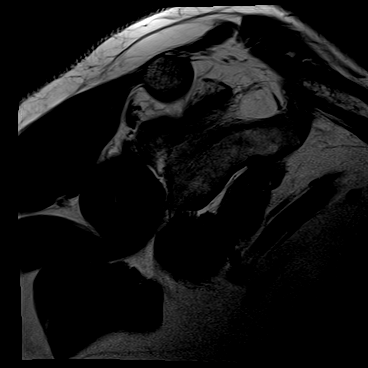
[im 11/25]
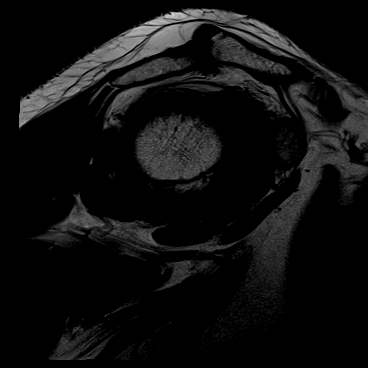
[im 14/25]
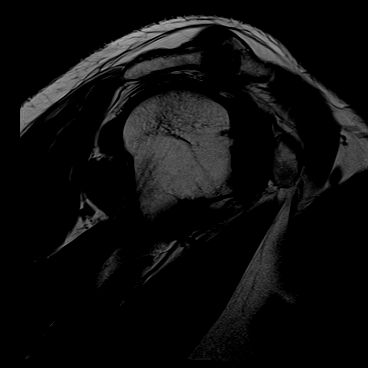
[im 18/25]
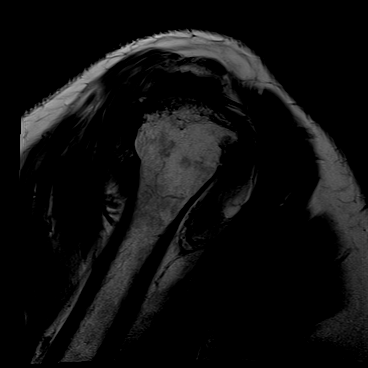
[im 21/25]
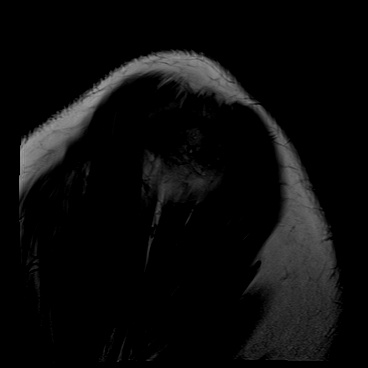
[im 25/25]
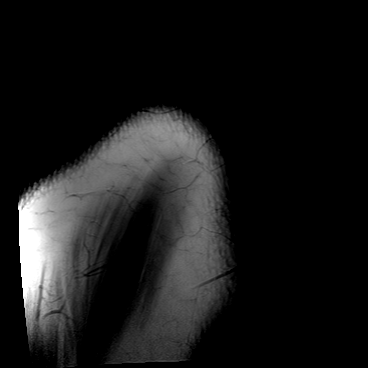

[40 of 40 positions shown; findings below may reference images not displayed]

FINDINGS: Rotator cuff: Full-thickness supraspinatus tear with approximately
2.6 cm tendon retraction. Tendinopathy with full-thickness partial
width tear of the anterior-most infraspinatus tendon. Teres minor
tendon is intact. Tendinopathy of the subscapularis without tear.

Muscles: Mild-to-moderate fatty infiltration of the infraspinatus
and supraspinatus.

Biceps Long Head: Tendinopathy of the intra-articular long head of
the biceps. Biceps labral anchor is however maintained.

Acromioclavicular Joint: Moderate to severe arthropathy of the
acromioclavicular joint characterized by joint capsular thickening
subchondral edema and cystic changes. Small subacromial/subdeltoid
bursal fluid, likely secondary to full-thickness supraspinatus tear.

Glenohumeral Joint: No joint effusion. No chondral defect.

Labrum: Grossly intact, but evaluation is limited by lack of
intraarticular fluid/contrast.

Bones: No fracture or dislocation. No aggressive osseous lesion.

Other: No fluid collection or hematoma.
IMPRESSION: 1. Full-thickness full width supraspinatus tear with approximately
2.6 cm tendon retraction.

2. Tendinopathy with full-thickness partial width tear of the
anterior most supraspinatus fibers.

3. Tendinopathy of the subscapularis and intra-articular long head
of the biceps without tear.

4.  Moderate to severe acromioclavicular osteoarthritis.

5.  Mild glenohumeral osteoarthritis.

6. Mild-to-moderate fatty infiltration of the infraspinatus and
supraspinatus.

7.  No evidence of fracture.

## 2021-11-17 ENCOUNTER — Ambulatory Visit (INDEPENDENT_AMBULATORY_CARE_PROVIDER_SITE_OTHER): Payer: Medicare Other | Admitting: Orthopedic Surgery

## 2021-11-17 DIAGNOSIS — G8929 Other chronic pain: Secondary | ICD-10-CM

## 2021-11-17 DIAGNOSIS — S46011D Strain of muscle(s) and tendon(s) of the rotator cuff of right shoulder, subsequent encounter: Secondary | ICD-10-CM

## 2021-11-17 NOTE — Progress Notes (Signed)
Tyler Hart ?FOLLOW UP  ? ?Encounter Diagnoses  ?Name Primary?  ? Chronic right shoulder pain Yes  ? Traumatic complete tear of right rotator cuff, subsequent encounter   ? ? ? ?Chief Complaint  ?Patient presents with  ? Follow-up  ?  Recheck on right shoulder  ? ? ?Tyler Hart comes back for MRI results.  ? ?He still having severe pain and limitations with his right shoulder ? ? I read his MRI he has a complete tear of his rotator cuff he has supraspinatus and infraspinatus tearing he does not have a lot of glenohumeral arthritis he has bulky AC joint arthritis bicipital tendon abnormality as well ? ?His AC joint is extremely arthritic ? ?I told him his results.  I told him I would get another opinion based on the MRI of what type of surgery should be done. ? ?The decision has to be made whether he needs a reverse replacement for an attempted cuff repair plus or minus graft or straightforward SCR ? ?His range of motion at 0 degrees abduction is 40 degrees of external rotation ? ?His passive flexion is 120 degrees his abduction is 90 degrees although painful and not reproduced actively he does have good range of motion ? ?I rechecked his x-ray he has no escape posterior subluxation of the humeral head ? ?Told him I will call him when I received more information from other orthopedist about the best option ?

## 2021-11-21 ENCOUNTER — Telehealth: Payer: Self-pay

## 2021-11-21 NOTE — Telephone Encounter (Signed)
Patient left message Friday stating he was suppose to get a call from you, in reference to you speaking with a specialist about his MRI. ? ?He wants you to give him a call [phone] ?

## 2021-11-22 ENCOUNTER — Telehealth: Payer: Self-pay | Admitting: Orthopedic Surgery

## 2021-11-22 ENCOUNTER — Other Ambulatory Visit: Payer: Self-pay

## 2021-11-22 DIAGNOSIS — G8929 Other chronic pain: Secondary | ICD-10-CM

## 2021-11-22 DIAGNOSIS — S46011D Strain of muscle(s) and tendon(s) of the rotator cuff of right shoulder, subsequent encounter: Secondary | ICD-10-CM

## 2021-11-22 NOTE — Telephone Encounter (Signed)
Dr. Marlou Sa was gracious enough to evaluate this patient's MRI and make recommendations regarding his shoulder I relayed these recommendations to Mr. Sebald and he is amenable to seeing Dr. Marlou Sa to discuss repair and possible tendon transfer right shoulder ?

## 2021-11-25 ENCOUNTER — Ambulatory Visit (INDEPENDENT_AMBULATORY_CARE_PROVIDER_SITE_OTHER): Payer: Medicare Other | Admitting: Orthopedic Surgery

## 2021-11-25 DIAGNOSIS — S46011D Strain of muscle(s) and tendon(s) of the rotator cuff of right shoulder, subsequent encounter: Secondary | ICD-10-CM

## 2021-11-26 ENCOUNTER — Encounter: Payer: Self-pay | Admitting: Orthopedic Surgery
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
## 2021-11-30 ENCOUNTER — Ambulatory Visit: Payer: Medicare Other | Admitting: Internal Medicine
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
## 2021-12-07 ENCOUNTER — Ambulatory Visit (INDEPENDENT_AMBULATORY_CARE_PROVIDER_SITE_OTHER): Payer: Medicare Other | Admitting: Internal Medicine

## 2021-12-07 ENCOUNTER — Encounter: Payer: Self-pay | Admitting: Internal Medicine

## 2021-12-07 VITALS — BP 119/78 | HR 73 | Resp 16 | Ht 74.0 in | Wt 251.0 lb

## 2021-12-07 DIAGNOSIS — R768 Other specified abnormal immunological findings in serum: Secondary | ICD-10-CM | POA: Diagnosis not present

## 2021-12-07 DIAGNOSIS — M1A09X1 Idiopathic chronic gout, multiple sites, with tophus (tophi): Secondary | ICD-10-CM | POA: Diagnosis not present

## 2021-12-07 DIAGNOSIS — M170 Bilateral primary osteoarthritis of knee: Secondary | ICD-10-CM | POA: Diagnosis not present

## 2021-12-07 DIAGNOSIS — S46011S Strain of muscle(s) and tendon(s) of the rotator cuff of right shoulder, sequela: Secondary | ICD-10-CM

## 2021-12-07 DIAGNOSIS — M75121 Complete rotator cuff tear or rupture of right shoulder, not specified as traumatic: Secondary | ICD-10-CM | POA: Insufficient documentation

## 2021-12-08 LAB — URIC ACID: Uric Acid, Serum: 3.6 mg/dL — ABNORMAL LOW (ref 4.0–8.0)

## 2021-12-08 NOTE — Progress Notes (Signed)
Uric acid is very well controlled down to 3.6. He can stop taking colchicine except as needed for a flare. He can continue allopurinol 2 tablets daily we can follow up in 6 months.

## 2022-01-07 ENCOUNTER — Other Ambulatory Visit: Payer: Self-pay | Admitting: Internal Medicine

## 2022-01-07 DIAGNOSIS — M1A09X1 Idiopathic chronic gout, multiple sites, with tophus (tophi): Secondary | ICD-10-CM

## 2022-01-16 ENCOUNTER — Telehealth: Payer: Self-pay | Admitting: Orthopedic Surgery

## 2022-01-16 NOTE — Telephone Encounter (Signed)
Pt called and is trying to schedule surgery. Pt states he has called several times and keeps getting the voicemail.   CB [phone]

## 2022-01-17 ENCOUNTER — Telehealth: Payer: Self-pay | Admitting: Orthopedic Surgery

## 2022-01-17 DIAGNOSIS — S46011D Strain of muscle(s) and tendon(s) of the rotator cuff of right shoulder, subsequent encounter: Secondary | ICD-10-CM

## 2022-01-17 NOTE — Telephone Encounter (Signed)
Patient has decided on a date of 03-14-22 for right shoulder arthroscopy, debridement, biceps tenodesis, distal clavicle excision, mini open rotator cuff tear repair vs lower trapezius tendon transfer with Dr. Marlou Sa.  Patient is asking if he could try physical therapy before surgery.  If so, he would like to do the physical therapy in [place].  Patient can be reached at [phone].

## 2022-01-18 NOTE — Telephone Encounter (Signed)
Order placed

## 2022-01-18 NOTE — Telephone Encounter (Signed)
IC patient to advise-no answer.

## 2022-01-18 NOTE — Addendum Note (Signed)
Addended byLaurann Montana on: 01/18/2022 05:19 PM   Modules accepted: Orders

## 2022-01-18 NOTE — Telephone Encounter (Signed)
Okay to try physical therapy 3 times a week for 6 weeks for shoulder strengthening and range of motion exercises and ADLs.  Thanks.

## 2022-01-26 ENCOUNTER — Ambulatory Visit (HOSPITAL_COMMUNITY): Payer: Medicare Other | Attending: Internal Medicine | Admitting: Occupational Therapy

## 2022-01-26 DIAGNOSIS — M25511 Pain in right shoulder: Secondary | ICD-10-CM | POA: Insufficient documentation

## 2022-01-26 DIAGNOSIS — R29898 Other symptoms and signs involving the musculoskeletal system: Secondary | ICD-10-CM | POA: Diagnosis present

## 2022-01-26 DIAGNOSIS — G8929 Other chronic pain: Secondary | ICD-10-CM | POA: Diagnosis present

## 2022-01-26 NOTE — Patient Instructions (Signed)
Perform each exercise ____10-15____ reps. 2-3x days.   1) Protraction   Start by holding a wand or cane at chest height.  Next, slowly push the wand outwards in front of your body so that your elbows become fully straightened. Then, return to the original position.     2) Shoulder FLEXION   In the standing position, hold a wand/cane with both arms, palms down on both sides. Raise up the wand/cane allowing your unaffected arm to perform most of the effort. Your affected arm should be partially relaxed.        3) Shoulder ABDUCTION   While holding a wand/cane palm face up on the injured side and palm face down on the uninjured side, slowly raise up your injured arm to the side.       4) Internal & External Rotation   Supine:     Standing:     Stand with elbows at the side and elbows bent 90 degrees. Move your forearms away from your body, then bring back inward toward the body.
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
## 2022-01-31 ENCOUNTER — Encounter (HOSPITAL_COMMUNITY): Payer: Self-pay | Admitting: Occupational Therapy

## 2022-01-31 ENCOUNTER — Ambulatory Visit (HOSPITAL_COMMUNITY): Payer: Medicare Other | Attending: Internal Medicine | Admitting: Occupational Therapy

## 2022-01-31 DIAGNOSIS — G8929 Other chronic pain: Secondary | ICD-10-CM | POA: Diagnosis present

## 2022-01-31 DIAGNOSIS — M25511 Pain in right shoulder: Secondary | ICD-10-CM | POA: Insufficient documentation

## 2022-01-31 DIAGNOSIS — R29898 Other symptoms and signs involving the musculoskeletal system: Secondary | ICD-10-CM | POA: Insufficient documentation
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
## 2022-02-03 ENCOUNTER — Ambulatory Visit (HOSPITAL_COMMUNITY): Payer: Medicare Other | Attending: Internal Medicine | Admitting: Occupational Therapy

## 2022-02-03 ENCOUNTER — Encounter (HOSPITAL_COMMUNITY): Payer: Self-pay | Admitting: Occupational Therapy

## 2022-02-03 DIAGNOSIS — M25511 Pain in right shoulder: Secondary | ICD-10-CM | POA: Diagnosis not present

## 2022-02-03 DIAGNOSIS — M75121 Complete rotator cuff tear or rupture of right shoulder, not specified as traumatic: Secondary | ICD-10-CM | POA: Insufficient documentation

## 2022-02-03 DIAGNOSIS — R2991 Unspecified symptoms and signs involving the musculoskeletal system: Secondary | ICD-10-CM | POA: Diagnosis not present

## 2022-02-03 DIAGNOSIS — R29898 Other symptoms and signs involving the musculoskeletal system: Secondary | ICD-10-CM

## 2022-02-03 DIAGNOSIS — G8929 Other chronic pain: Secondary | ICD-10-CM

## 2022-02-21 ENCOUNTER — Encounter (HOSPITAL_COMMUNITY): Payer: Self-pay

## 2022-02-21 ENCOUNTER — Ambulatory Visit (HOSPITAL_COMMUNITY): Payer: Medicare Other | Attending: Internal Medicine

## 2022-02-21 DIAGNOSIS — G8929 Other chronic pain: Secondary | ICD-10-CM | POA: Insufficient documentation

## 2022-02-21 DIAGNOSIS — M25511 Pain in right shoulder: Secondary | ICD-10-CM | POA: Insufficient documentation

## 2022-02-21 DIAGNOSIS — R29898 Other symptoms and signs involving the musculoskeletal system: Secondary | ICD-10-CM | POA: Insufficient documentation
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
## 2022-02-23 ENCOUNTER — Ambulatory Visit (HOSPITAL_COMMUNITY): Payer: Medicare Other

## 2022-02-23 ENCOUNTER — Encounter (HOSPITAL_COMMUNITY): Payer: Self-pay

## 2022-02-23 DIAGNOSIS — M25511 Pain in right shoulder: Secondary | ICD-10-CM | POA: Diagnosis not present

## 2022-02-23 DIAGNOSIS — R29898 Other symptoms and signs involving the musculoskeletal system: Secondary | ICD-10-CM

## 2022-02-23 DIAGNOSIS — G8929 Other chronic pain: Secondary | ICD-10-CM
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
## 2022-02-28 ENCOUNTER — Ambulatory Visit (HOSPITAL_COMMUNITY): Payer: Medicare Other

## 2022-02-28 ENCOUNTER — Encounter (HOSPITAL_COMMUNITY): Payer: Self-pay

## 2022-02-28 DIAGNOSIS — M25511 Pain in right shoulder: Secondary | ICD-10-CM | POA: Diagnosis not present

## 2022-02-28 DIAGNOSIS — R29898 Other symptoms and signs involving the musculoskeletal system: Secondary | ICD-10-CM

## 2022-02-28 DIAGNOSIS — G8929 Other chronic pain: Secondary | ICD-10-CM
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
## 2022-03-03 ENCOUNTER — Ambulatory Visit (HOSPITAL_COMMUNITY): Payer: Medicare Other | Admitting: Occupational Therapy

## 2022-03-03 ENCOUNTER — Encounter (HOSPITAL_COMMUNITY): Payer: Self-pay | Admitting: Occupational Therapy

## 2022-03-03 DIAGNOSIS — R29898 Other symptoms and signs involving the musculoskeletal system: Secondary | ICD-10-CM

## 2022-03-03 DIAGNOSIS — M25511 Pain in right shoulder: Secondary | ICD-10-CM | POA: Diagnosis not present

## 2022-03-03 DIAGNOSIS — G8929 Other chronic pain: Secondary | ICD-10-CM

## 2022-03-03 NOTE — Addendum Note (Signed)
Addended byFlonnie Hailstone on: 03/03/2022 12:51 PM   Modules accepted: Orders
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
## 2022-03-06 NOTE — Pre-Procedure Instructions (Signed)
Surgical Instructions    Your procedure is scheduled on March 14, 2022.  Report to [facility] Main Entrance "A" at 8:45 A.M., then check in with the Admitting office.  Call this number if you have problems the morning of surgery:  [phone]   If you have any questions prior to your surgery date call [phone]: Open Monday-Friday 8am-4pm    Remember:  Do not eat after midnight the night before your surgery  You may drink clear liquids until 7:45 AM the morning of your surgery.   Clear liquids allowed are: Water, Non-Citrus Juices (without pulp), Carbonated Beverages, Clear Tea, Black Coffee Only (NO MILK, CREAM OR POWDERED CREAMER of any kind), and Gatorade.  Patient Instructions  The night before surgery:  No food after midnight. ONLY clear liquids after midnight  The day of surgery (if you do NOT have diabetes):  Drink ONE (1) Pre-Surgery Clear Ensure by 7:45 AM the morning of surgery. Drink in one sitting. Do not sip.  This drink was given to you during your hospital  pre-op appointment visit.  Nothing else to drink after completing the  Pre-Surgery Clear Ensure.         If you have questions, please contact your surgeon's office.     Take these medicines the morning of surgery with A SIP OF WATER:  allopurinol (ZYLOPRIM)  baclofen (LIORESAL)   gabapentin (NEURONTIN)  MITIGARE  oxyCODONE-acetaminophen (PERCOCET)   As of today, STOP taking any Aspirin (unless otherwise instructed by your surgeon) Aleve, Naproxen, Ibuprofen, Motrin, Advil, Goody's, BC's, all herbal medications, fish oil, and all vitamins.                     Do NOT Smoke (Tobacco/Vaping) for 24 hours prior to your procedure.  If you use a CPAP at night, you may bring your mask/headgear for your overnight stay.   Contacts, glasses, piercing's, hearing aid's, dentures or partials may not be worn into surgery, please bring cases for these belongings.    For patients admitted to the hospital,  discharge time will be determined by your treatment team.   Patients discharged the day of surgery will not be allowed to drive home, and someone needs to stay with them for 24 hours.  SURGICAL WAITING ROOM VISITATION Patients having surgery or a procedure may have no more than 2 support people in the waiting area - these visitors may rotate.   Children under the age of 57 must have an adult with them who is not the patient. If the patient needs to stay at the hospital during part of their recovery, the visitor guidelines for inpatient rooms apply. Pre-op nurse will coordinate an appropriate time for 1 support person to accompany patient in pre-op.  This support person may not rotate.   Please refer to the [facility] website for the visitor guidelines for Inpatients (after your surgery is over and you are in a regular room).    Special instructions:   [place]- Preparing For Surgery  Before surgery, you can play an important role. Because skin is not sterile, your skin needs to be as free of germs as possible. You can reduce the number of germs on your skin by washing with CHG (chlorahexidine gluconate) Soap before surgery.  CHG is an antiseptic cleaner which kills germs and bonds with the skin to continue killing germs even after washing.    Oral Hygiene is also important to reduce your risk of infection.  Remember - BRUSH  YOUR TEETH THE MORNING OF SURGERY WITH YOUR REGULAR TOOTHPASTE  Please do not use if you have an allergy to CHG or antibacterial soaps. If your skin becomes reddened/irritated stop using the CHG.  Do not shave (including legs and underarms) for at least 48 hours prior to first CHG shower. It is OK to shave your face.  Please follow these instructions carefully.   Shower the NIGHT BEFORE SURGERY and the MORNING OF SURGERY  If you chose to wash your hair, wash your hair first as usual with your normal shampoo.  After you shampoo, rinse your hair and body thoroughly  to remove the shampoo.  Use CHG Soap as you would any other liquid soap. You can apply CHG directly to the skin and wash gently with a scrungie or a clean washcloth.   Apply the CHG Soap to your body ONLY FROM THE NECK DOWN.  Do not use on open wounds or open sores. Avoid contact with your eyes, ears, mouth and genitals (private parts). Wash Face and genitals (private parts)  with your normal soap.   Wash thoroughly, paying special attention to the area where your surgery will be performed.  Thoroughly rinse your body with warm water from the neck down.  DO NOT shower/wash with your normal soap after using and rinsing off the CHG Soap.  Pat yourself dry with a CLEAN TOWEL.  Wear CLEAN PAJAMAS to bed the night before surgery  Place CLEAN SHEETS on your bed the night before your surgery  DO NOT SLEEP WITH PETS.   Day of Surgery: Take a shower with CHG soap. Do not wear jewelry or makeup Do not wear lotions, powders, perfumes/colognes, or deodorant. Do not shave 48 hours prior to surgery.  Men may shave face and neck. Do not bring valuables to the hospital.  [facility] is not responsible for any belongings or valuables. Do not wear nail polish, gel polish, artificial nails, or any other type of covering on natural nails (fingers and toes) If you have artificial nails or gel coating that need to be removed by a nail salon, please have this removed prior to surgery. Artificial nails or gel coating may interfere with anesthesia's ability to adequately monitor your vital signs. Wear Clean/Comfortable clothing the morning of surgery Remember to brush your teeth WITH YOUR REGULAR TOOTHPASTE.   Please read over the following fact sheets that you were given.    If you received a COVID test during your pre-op visit  it is requested that you wear a mask when out in public, stay away from anyone that may not be feeling well and notify your surgeon if you develop symptoms. If you have been in  contact with anyone that has tested positive in the last 10 days please notify you surgeon.

## 2022-03-07 ENCOUNTER — Inpatient Hospital Stay (HOSPITAL_COMMUNITY)
Admission: RE | Admit: 2022-03-07 | Discharge: 2022-03-07 | Disposition: A | Discharge status: Home / Self Care | Payer: Medicare Other | Source: Ambulatory Visit

## 2022-03-07 ENCOUNTER — Ambulatory Visit (HOSPITAL_COMMUNITY): Payer: Medicare Other

## 2022-03-09 ENCOUNTER — Encounter (HOSPITAL_COMMUNITY): Payer: Self-pay | Admitting: Occupational Therapy

## 2022-03-09 ENCOUNTER — Ambulatory Visit (HOSPITAL_COMMUNITY): Payer: Medicare Other | Admitting: Occupational Therapy

## 2022-03-09 DIAGNOSIS — M25511 Pain in right shoulder: Secondary | ICD-10-CM | POA: Diagnosis not present

## 2022-03-09 DIAGNOSIS — R29898 Other symptoms and signs involving the musculoskeletal system: Secondary | ICD-10-CM

## 2022-03-09 DIAGNOSIS — G8929 Other chronic pain: Secondary | ICD-10-CM
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
## 2022-03-09 NOTE — Patient Instructions (Signed)

## 2022-03-14 DIAGNOSIS — Z01818 Encounter for other preprocedural examination: Secondary | ICD-10-CM

## 2022-03-22 ENCOUNTER — Encounter: Payer: Medicare Other | Admitting: Orthopedic Surgery

## 2022-05-04 ENCOUNTER — Encounter: Payer: Self-pay | Admitting: Orthopedic Surgery

## 2022-05-04 ENCOUNTER — Ambulatory Visit (INDEPENDENT_AMBULATORY_CARE_PROVIDER_SITE_OTHER): Payer: Medicare Other | Admitting: Orthopedic Surgery

## 2022-05-04 ENCOUNTER — Ambulatory Visit (INDEPENDENT_AMBULATORY_CARE_PROVIDER_SITE_OTHER): Payer: Medicare Other

## 2022-05-04 VITALS — Ht 74.0 in | Wt 250.0 lb

## 2022-05-04 DIAGNOSIS — G8929 Other chronic pain: Secondary | ICD-10-CM | POA: Diagnosis not present

## 2022-05-04 DIAGNOSIS — M25561 Pain in right knee: Secondary | ICD-10-CM | POA: Diagnosis not present

## 2022-05-04 DIAGNOSIS — M1711 Unilateral primary osteoarthritis, right knee: Secondary | ICD-10-CM | POA: Diagnosis not present

## 2022-05-04 MED ORDER — METHYLPREDNISOLONE ACETATE 40 MG/ML IJ SUSP
40.0000 mg | Freq: Once | INTRAMUSCULAR | Status: AC
  Administered 2022-05-04: 40 mg via INTRA_ARTICULAR

## 2022-05-04 NOTE — Progress Notes (Signed)
Established patient visit  61 year old male soon to have surgery on his right shoulder presents for follow-up of his chronic knee pain  Tyler Hart has severe arthritis of his right knee is worsening and he is considering some surgical intervention sooner rather than later  He complains of sharp dull aching pain in his right knee with some feelings of instability with crepitance clicking locking and mechanical symptoms  No Known Allergies  Current Outpatient Medications  Medication Instructions   allopurinol (ZYLOPRIM) 300 MG tablet TAKE 1 AND 1/2 TABLETS(450 MG) BY MOUTH DAILY   baclofen (LIORESAL) 10 mg, Oral, 3 times daily   gabapentin (NEURONTIN) 300 mg, Oral, 3 times daily   meloxicam (MOBIC) 15 mg, Oral, Daily   Mitigare 0.6 mg, Oral, Daily   oxyCODONE-acetaminophen (PERCOCET) 10-325 MG tablet 1 tablet, Oral, 4 times daily   Vitamin D3 50,000 Units, Oral, Weekly   Ht '6\' 2"'$  (1.88 m)   Wt 250 lb (113.4 kg)   BMI 32.10 kg/m   BMI 32 patient ambulates without assistive device patient has severe varus in his right knee with medial joint line tenderness the knee itself just looks like it is deformed.  He has hypertrophic overgrowth of his distal femur.  There is no specific joint effusion  He does have approximately 110 degrees of knee flexion 5 degree flexion contracture AP instability is noted  Updated x-rays today show severe arthritis grade 4 with varus moderate to severe  Recommend the patient continue with his surgery and then perhaps in 3 months or when Dr. Marlou Sa says it is okay he can have his knee replaced  Procedure note right knee injection   verbal consent was obtained to inject right knee joint  Timeout was completed to confirm the site of injection  The medications used were depomedrol 40 mg and 1% lidocaine 3 cc Anesthesia was provided by ethyl chloride and the skin was prepped with alcohol.  After cleaning the skin with alcohol a 20-gauge needle was used to  inject the right knee joint. There were no complications. A sterile bandage was applied.

## 2022-05-13 MED ORDER — TRIAMCINOLONE ACETONIDE 40 MG/ML IJ SUSP
20.0000 mg | INTRAMUSCULAR | Status: AC | PRN
  Administered 2021-06-01: 20 mg via INTRA_ARTICULAR

## 2022-05-13 MED ORDER — LIDOCAINE HCL 1 % IJ SOLN
0.5000 mL | INTRAMUSCULAR | Status: AC | PRN
  Administered 2021-06-01: .5 mL

## 2022-05-16 NOTE — Pre-Procedure Instructions (Signed)
Surgical Instructions    Your procedure is scheduled on May 25, 2022.  Report to [facility] Main Entrance "A" at 10:15 A.M., then check in with the Admitting office.  Call this number if you have problems the morning of surgery:  [phone]   If you have any questions prior to your surgery date call [phone]: Open Monday-Friday 8am-4pm    Remember:  Do not eat after midnight the night before your surgery  You may drink clear liquids until 9:15 AM the morning of your surgery.   Clear liquids allowed are: Water, Non-Citrus Juices (without pulp), Carbonated Beverages, Clear Tea, Black Coffee Only (NO MILK, CREAM OR POWDERED CREAMER of any kind), and Gatorade.  Patient Instructions  The night before surgery:  No food after midnight. ONLY clear liquids after midnight  The day of surgery (if you do NOT have diabetes):  Drink ONE (1) Pre-Surgery Clear Ensure by 9:15 AM the morning of surgery. Drink in one sitting. Do not sip.  This drink was given to you during your hospital  pre-op appointment visit.  Nothing else to drink after completing the  Pre-Surgery Clear Ensure.         If you have questions, please contact your surgeon's office.      Take these medicines the morning of surgery with A SIP OF WATER:  allopurinol (ZYLOPRIM)   baclofen (LIORESAL)   gabapentin (NEURONTIN)  oxyCODONE-acetaminophen (PERCOCET)     As of today, STOP taking any Aspirin (unless otherwise instructed by your surgeon) Aleve, Naproxen, Ibuprofen, Motrin, Advil, Goody's, BC's, all herbal medications, fish oil, and all vitamins. This includes your medication: meloxicam (MOBIC)                     Do NOT Smoke (Tobacco/Vaping) for 24 hours prior to your procedure.  If you use a CPAP at night, you may bring your mask/headgear for your overnight stay.   Contacts, glasses, piercing's, hearing aid's, dentures or partials may not be worn into surgery, please bring cases for these belongings.     For patients admitted to the hospital, discharge time will be determined by your treatment team.   Patients discharged the day of surgery will not be allowed to drive home, and someone needs to stay with them for 24 hours.  SURGICAL WAITING ROOM VISITATION Patients having surgery or a procedure may have no more than 2 support people in the waiting area - these visitors may rotate.   Children under the age of 60 must have an adult with them who is not the patient. If the patient needs to stay at the hospital during part of their recovery, the visitor guidelines for inpatient rooms apply. Pre-op nurse will coordinate an appropriate time for 1 support person to accompany patient in pre-op.  This support person may not rotate.   Please refer to the [facility] website for the visitor guidelines for Inpatients (after your surgery is over and you are in a regular room).    Special instructions:   [place]- Preparing For Surgery  Before surgery, you can play an important role. Because skin is not sterile, your skin needs to be as free of germs as possible. You can reduce the number of germs on your skin by washing with CHG (chlorahexidine gluconate) Soap before surgery.  CHG is an antiseptic cleaner which kills germs and bonds with the skin to continue killing germs even after washing.    Oral Hygiene is also important to reduce  your risk of infection.  Remember - BRUSH YOUR TEETH THE MORNING OF SURGERY WITH YOUR REGULAR TOOTHPASTE  Please do not use if you have an allergy to CHG or antibacterial soaps. If your skin becomes reddened/irritated stop using the CHG.  Do not shave (including legs and underarms) for at least 48 hours prior to first CHG shower. It is OK to shave your face.  Please follow these instructions carefully.   Shower the NIGHT BEFORE SURGERY and the MORNING OF SURGERY  If you chose to wash your hair, wash your hair first as usual with your normal shampoo.  After you  shampoo, rinse your hair and body thoroughly to remove the shampoo.  Use CHG Soap as you would any other liquid soap. You can apply CHG directly to the skin and wash gently with a scrungie or a clean washcloth.   Apply the CHG Soap to your body ONLY FROM THE NECK DOWN.  Do not use on open wounds or open sores. Avoid contact with your eyes, ears, mouth and genitals (private parts). Wash Face and genitals (private parts)  with your normal soap.   Wash thoroughly, paying special attention to the area where your surgery will be performed.  Thoroughly rinse your body with warm water from the neck down.  DO NOT shower/wash with your normal soap after using and rinsing off the CHG Soap.  Pat yourself dry with a CLEAN TOWEL.  Wear CLEAN PAJAMAS to bed the night before surgery  Place CLEAN SHEETS on your bed the night before your surgery  DO NOT SLEEP WITH PETS.   Day of Surgery: Take a shower with CHG soap. Do not wear jewelry or makeup Do not wear lotions, powders, perfumes/colognes, or deodorant. Do not shave 48 hours prior to surgery.  Men may shave face and neck. Do not bring valuables to the hospital.  [facility] is not responsible for any belongings or valuables. Do not wear nail polish, gel polish, artificial nails, or any other type of covering on natural nails (fingers and toes) If you have artificial nails or gel coating that need to be removed by a nail salon, please have this removed prior to surgery. Artificial nails or gel coating may interfere with anesthesia's ability to adequately monitor your vital signs.  Wear Clean/Comfortable clothing the morning of surgery Remember to brush your teeth WITH YOUR REGULAR TOOTHPASTE.   Please read over the following fact sheets that you were given.    If you received a COVID test during your pre-op visit  it is requested that you wear a mask when out in public, stay away from anyone that may not be feeling well and notify your  surgeon if you develop symptoms. If you have been in contact with anyone that has tested positive in the last 10 days please notify you surgeon.

## 2022-05-17 ENCOUNTER — Other Ambulatory Visit: Payer: Self-pay

## 2022-05-17 ENCOUNTER — Encounter (HOSPITAL_COMMUNITY): Payer: Self-pay

## 2022-05-17 ENCOUNTER — Encounter (HOSPITAL_COMMUNITY)
Admission: RE | Admit: 2022-05-17 | Discharge: 2022-05-17 | Disposition: A | Discharge status: Home / Self Care | Payer: Medicare Other | Source: Ambulatory Visit | Attending: Orthopedic Surgery | Admitting: Orthopedic Surgery

## 2022-05-17 VITALS — BP 157/104 | HR 84 | Temp 98.4°F | Resp 18 | Ht 74.0 in | Wt 249.0 lb

## 2022-05-17 DIAGNOSIS — I2510 Atherosclerotic heart disease of native coronary artery without angina pectoris: Secondary | ICD-10-CM | POA: Insufficient documentation

## 2022-05-17 DIAGNOSIS — K759 Inflammatory liver disease, unspecified: Secondary | ICD-10-CM | POA: Diagnosis not present

## 2022-05-17 DIAGNOSIS — Z01818 Encounter for other preprocedural examination: Secondary | ICD-10-CM | POA: Diagnosis present

## 2022-05-17 LAB — COMPREHENSIVE METABOLIC PANEL
ALT: 32 U/L (ref 0–44)
AST: 45 U/L — ABNORMAL HIGH (ref 15–41)
Albumin: 4.1 g/dL (ref 3.5–5.0)
Alkaline Phosphatase: 56 U/L (ref 38–126)
Anion gap: 7 (ref 5–15)
BUN: 10 mg/dL (ref 6–20)
CO2: 26 mmol/L (ref 22–32)
Calcium: 9.3 mg/dL (ref 8.9–10.3)
Chloride: 104 mmol/L (ref 98–111)
Creatinine, Ser: 0.89 mg/dL (ref 0.61–1.24)
GFR, Estimated: >60 mL/min (ref >60)
Glucose, Bld: 80 mg/dL (ref 70–99)
Potassium: 3.8 mmol/L (ref 3.5–5.1)
Sodium: 137 mmol/L (ref 135–145)
Total Bilirubin: 1.7 mg/dL — ABNORMAL HIGH (ref 0.3–1.2)
Total Protein: 7.7 g/dL (ref 6.5–8.1)

## 2022-05-17 LAB — CBC
HCT: 50 % (ref 39.0–52.0)
Hemoglobin: 17 g/dL (ref 13.0–17.0)
MCH: 33.3 pg (ref 26.0–34.0)
MCHC: 34 g/dL (ref 30.0–36.0)
MCV: 98 fL (ref 80.0–100.0)
Platelets: 228 10*3/uL (ref 150–400)
RBC: 5.1 MIL/uL (ref 4.22–5.81)
RDW: 12.6 % (ref 11.5–15.5)
WBC: 5.6 10*3/uL (ref 4.0–10.5)
nRBC: 0 % (ref 0.0–0.2)

## 2022-05-17 NOTE — Progress Notes (Signed)
PCP - Dr.Tesfaye Fanta Cardiologist - denies  PPM/ICD - denies Device Orders - n/a Rep Notified - n/a  Chest x-ray - 07/21/21 EKG - 05/17/2022 Stress Test - denies ECHO - 12/20/18 Cardiac Cath - denies  Sleep Study - denies CPAP - n/a  NO DM  Blood Thinner Instructions:n/a Aspirin Instructions:n/a  ERAS Protcol -yes, clear liquids until 9:15 am morning of surgery. YES PRE-SURGERY Ensure given at PAT visit.   COVID TEST- n/a   Anesthesia review: NO  Patient denies shortness of breath, fever, cough and chest pain at PAT appointment   All instructions explained to the patient, with a verbal understanding of the material. Patient agrees to go over the instructions while at home for a better understanding. Patient also instructed to self quarantine after being tested for COVID-19. The opportunity to ask questions was provided.

## 2022-05-18 ENCOUNTER — Other Ambulatory Visit: Payer: Self-pay

## 2022-05-18 DIAGNOSIS — R9431 Abnormal electrocardiogram [ECG] [EKG]: Secondary | ICD-10-CM

## 2022-05-18 NOTE — Progress Notes (Signed)
thx

## 2022-05-21 NOTE — Progress Notes (Unsigned)
Cardiology Office Note:    Date:  05/22/2022   ID:  Tyler Hart, DOB 1960-09-03, MRN [id number]  PCP:  Carrolyn Meiers, MD   [place] Prov[facility] Cardiologist:  Lenna Sciara, MD Referring MD: Carrolyn Meiers*   Chief Complaint/Reason for Referral: Preoperative cardiovascular assessment for abnormal EKG  ASSESSMENT:    1. Nonspecific abnormal electrocardiogram (ECG) (EKG)   2. Preoperative cardiovascular examination   3. Aortic atherosclerosis ([place])   4. Hyperlipidemia LDL goal <70     PLAN:    In order of problems listed above: 1.  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  No further evaluation is necessary. 2.  Preoperative cardiovascular risk assessment: The patient can do 4 METS of activity without any issues.  No further cardiac evaluation is necessary prior to his orthopedic surgery. 3.  Aortic atherosclerosis: Start aspirin 81 mg daily, atorvastatin 20 mg daily and pursue strict blood pressure control. 4.  Hyperlipidemia: By virtue of aortic atherosclerosis his goal LDL is less than 70; start atorvastatin as detailed above and check lipid panel, LFTs, and LP(a) in 2 months.             Dispo:  Return in about 6 months (around 11/21/2022).      Medication Adjustments/Labs and Tests Ordered: Current medicines are reviewed at length with the patient today.  Concerns regarding medicines are outlined above.  The following changes have been made:     Labs/tests ordered: Orders Placed This Encounter  Procedures   Hepatic function panel   Lipoprotein A (LPA)   Lipid panel    Medication Changes: Meds ordered this encounter  Medications   atorvastatin (LIPITOR) 20 MG tablet    Sig: Take 1 tablet (20 mg total) by mouth daily.    Dispense:  90 tablet    Refill:  3   aspirin EC 81 MG tablet    Sig: Take 1 tablet (81 mg total) by mouth daily. Swallow whole.     Dispense:  90 tablet    Refill:  3     Current medicines are reviewed at length with the patient today.  The patient does not have concerns regarding medicines.   History of Present Illness:    FOCUSED PROBLEM LIST:   1.  Hypertension 2.  Hepatitis C 3.  Gout 4.  Arthritis 5.  Aortic atherosclerosis on CT renal stone study 2022  The patient is a 61 y.o. male with the indicated medical history here for recommendations regarding abnormal EKG in preparation for shoulder surgery.  The patient was seen for preoperative evaluation and an EKG was done which showed normal sinus rhythm and a left anterior fascicular block.  Today the patient is doing well.  He is able to do all of his activities of daily living other than lifting something heavy with his right arm.  He is able to walk around his house, go grocery shopping, and perform his activities of daily living without exertional angina, exertional dyspnea, presyncope, syncope, or palpitations.  He has required no emergency room visits or hospitalizations recently.  He is otherwise well without significant complaints.          Current Medications: Current Meds  Medication Sig   allopurinol (ZYLOPRIM) 300 MG tablet TAKE 1 AND 1/2 TABLETS(450 MG) BY MOUTH DAILY   aspirin EC 81 MG tablet Take 1 tablet (81 mg total) by mouth daily. Swallow whole.   atorvastatin (LIPITOR)  20 MG tablet Take 1 tablet (20 mg total) by mouth daily.   baclofen (LIORESAL) 10 MG tablet Take 10 mg by mouth 3 (three) times daily.   Cholecalciferol (VITAMIN D3) 1.25 MG (50000 UT) CAPS Take 50,000 Units by mouth once a week.   gabapentin (NEURONTIN) 300 MG capsule Take 1 capsule (300 mg total) by mouth 3 (three) times daily.   meloxicam (MOBIC) 15 MG tablet Take 15 mg by mouth daily.   MITIGARE 0.6 MG CAPS Take 0.6 mg by mouth daily.   oxyCODONE-acetaminophen (PERCOCET) 10-325 MG tablet Take 1 tablet by mouth in the morning, at noon, in the evening, and at bedtime.      Allergies:    Patient has no known allergies.   Social History:   Social History   Tobacco Use   Smoking status: Never   Smokeless tobacco: Never  Vaping Use   Vaping Use: Never used  Substance Use Topics   Alcohol use: Yes    Alcohol/week: 2.0 - 3.0 standard drinks of alcohol    Types: 2 - 3 Cans of beer per week   Drug use: Not Currently    Types: Marijuana    Comment: last time was 2-3 months ago per pt     Family Hx: Family History  Problem Relation Age of Onset   CAD Mother    Diabetes Father    Alcohol abuse Father    Healthy Sister    Healthy Brother    Healthy Son      Review of Systems:   Please see the history of present illness.    All other systems reviewed and are negative.     EKGs/Labs/Other Test Reviewed:    EKG:  EKG performed May 17, 2022 that I personally reviewed demonstrates sinus rhythm with left anterior fascicular block and left ventricular hypertrophy with repolarization abnormality  Prior CV studies:  TTE 2020: 1. The left ventricle has normal systolic function with an ejection  fraction of 60-65%. The cavity size was normal. There is mildly increased  left ventricular wall thickness. Left ventricular diastolic Doppler  parameters are indeterminate. No evidence  of left ventricular regional wall motion abnormalities.   2. The right ventricle has normal systolic function. The cavity was  normal. There is no increase in right ventricular wall thickness. Right  ventricular systolic pressure could not be assessed.   3. The aortic valve is tricuspid. Mild aortic annular calcification  noted.   4. The mitral valve is grossly normal.   5. The tricuspid valve is grossly normal. There is mild tricuspid  regurgitation.   6. The aortic root is normal in size and structure.   Other studies Reviewed: Review of the additional studies/records demonstrates: CT renal stone study 2022 demonstrates aortic atherosclerosis  Recent  Labs: 05/17/2022: ALT 32; BUN 10; Creatinine, Ser 0.89; Hemoglobin 17.0; Platelets 228; Potassium 3.8; Sodium 137   Recent Lipid Panel Lab Results  Component Value Date/Time   CHOL 165 12/20/2018 05:20 AM   TRIG 84 12/20/2018 05:20 AM   HDL 42 12/20/2018 05:20 AM   LDLCALC 106 (H) 12/20/2018 05:20 AM    Risk Assessment/Calculations:                Physical Exam:    VS:  BP 136/80   Pulse 71   Ht '6\' 2"'$  (1.88 m)   Wt 249 lb (112.9 kg)   SpO2 98%   BMI 31.97 kg/m    Wt Readings from Last 3  Encounters:  05/22/22 249 lb (112.9 kg)  05/17/22 249 lb (112.9 kg)  05/04/22 250 lb (113.4 kg)    GENERAL:  No apparent distress, AOx3 HEENT:  No carotid bruits, +2 carotid impulses, no scleral icterus CAR: RRR no murmurs, gallops, rubs, or thrills RES:  Clear to auscultation bilaterally ABD:  Soft, nontender, nondistended, positive bowel sounds x 4 VASC:  +2 radial pulses, +2 carotid pulses, palpable pedal pulses NEURO:  CN 2-12 grossly intact; motor and sensory grossly intact PSYCH:  No active depression or anxiety EXT:  No edema, ecchymosis, or cyanosis  Signed, Early Osmond, MD  05/22/2022 2:48 PM    [address], [place], [place]  [address] Phone: [phone]; Fax: [phone]   Note:  This document was prepared using Dragon voice recognition software and may include unintentional dictation errors.

## 2022-05-22 ENCOUNTER — Encounter: Payer: Self-pay | Admitting: Internal Medicine

## 2022-05-22 ENCOUNTER — Other Ambulatory Visit: Payer: Self-pay | Admitting: *Deleted

## 2022-05-22 ENCOUNTER — Ambulatory Visit: Payer: Medicare Other | Attending: Internal Medicine | Admitting: Internal Medicine

## 2022-05-22 VITALS — BP 136/80 | HR 71 | Ht 74.0 in | Wt 249.0 lb

## 2022-05-22 DIAGNOSIS — R9431 Abnormal electrocardiogram [ECG] [EKG]: Secondary | ICD-10-CM | POA: Insufficient documentation

## 2022-05-22 DIAGNOSIS — Z01810 Encounter for preprocedural cardiovascular examination: Secondary | ICD-10-CM | POA: Diagnosis present

## 2022-05-22 DIAGNOSIS — E785 Hyperlipidemia, unspecified: Secondary | ICD-10-CM

## 2022-05-22 DIAGNOSIS — I700 Atherosclerosis of aorta: Secondary | ICD-10-CM | POA: Insufficient documentation

## 2022-05-22 MED ORDER — ATORVASTATIN CALCIUM 20 MG PO TABS: 20.0000 mg | ORAL_TABLET | Freq: Every day | ORAL | 3 refills | Status: AC

## 2022-05-22 MED ORDER — ASPIRIN 81 MG PO TBEC: 81.0000 mg | DELAYED_RELEASE_TABLET | Freq: Every day | ORAL | 3 refills | Status: AC

## 2022-05-22 NOTE — Patient Instructions (Addendum)
Medication Instructions:  Your physician has recommended you make the following change in your medication:  1.) start aspirin 81 mg daily 2.) start atorvastatin 20 mg daily  *If you need a refill on your cardiac medications before your next appointment, please call your pharmacy*   Lab Work: Please return in 2 months for blood work (lipids/liver/Lpa) (Pt plans to use [facility])  If you have labs (blood work) drawn today and your tests are completely normal, you will receive your results only by: [place] (if you have MyChart) OR A paper copy in the mail If you have any lab test that is abnormal or we need to change your treatment, we will call you to review the results.   Testing/Procedures: none   Follow-Up: At [facility], you and your health needs are our priority.  As part of our continuing mission to provide you with exceptional heart care, we have created designated Provider Care Teams.  These Care Teams include your primary Cardiologist (physician) and Advanced Practice Providers (APPs -  Physician Assistants and Nurse Practitioners) who all work together to provide you with the care you need, when you need it.  We recommend signing up for the patient portal called "MyChart".  Sign up information is provided on this After Visit Summary.  MyChart is used to connect with patients for Virtual Visits (Telemedicine).  Patients are able to view lab/test results, encounter notes, upcoming appointments, etc.  Non-urgent messages can be sent to your provider as well.   To learn more about what you can do with MyChart, go to [email or web address].    Your next appointment:   6 month(s)  The format for your next appointment:   In Person  Provider:   Lenna Sciara, MD   Important Information About Sugar

## 2022-05-25 ENCOUNTER — Ambulatory Visit (HOSPITAL_COMMUNITY)
Admission: RE | Admit: 2022-05-25 | Discharge: 2022-05-25 | Disposition: A | Discharge status: Home / Self Care | Payer: Medicare Other | Attending: Orthopedic Surgery | Admitting: Orthopedic Surgery

## 2022-05-25 ENCOUNTER — Encounter (HOSPITAL_COMMUNITY): Payer: Self-pay | Admitting: Orthopedic Surgery

## 2022-05-25 ENCOUNTER — Other Ambulatory Visit: Payer: Self-pay

## 2022-05-25 ENCOUNTER — Encounter (HOSPITAL_COMMUNITY)
Admission: RE | Disposition: A | Discharge status: Home / Self Care | Payer: Self-pay | Source: Home / Self Care | Attending: Orthopedic Surgery

## 2022-05-25 ENCOUNTER — Ambulatory Visit (HOSPITAL_BASED_OUTPATIENT_CLINIC_OR_DEPARTMENT_OTHER): Payer: Medicare Other | Admitting: Anesthesiology

## 2022-05-25 ENCOUNTER — Ambulatory Visit (HOSPITAL_COMMUNITY): Payer: Medicare Other | Admitting: Physician Assistant

## 2022-05-25 DIAGNOSIS — M12811 Other specific arthropathies, not elsewhere classified, right shoulder: Secondary | ICD-10-CM

## 2022-05-25 DIAGNOSIS — M7521 Bicipital tendinitis, right shoulder: Secondary | ICD-10-CM | POA: Diagnosis not present

## 2022-05-25 DIAGNOSIS — Z79891 Long term (current) use of opiate analgesic: Secondary | ICD-10-CM | POA: Diagnosis not present

## 2022-05-25 DIAGNOSIS — M75101 Unspecified rotator cuff tear or rupture of right shoulder, not specified as traumatic: Secondary | ICD-10-CM

## 2022-05-25 DIAGNOSIS — S43431A Superior glenoid labrum lesion of right shoulder, initial encounter: Secondary | ICD-10-CM

## 2022-05-25 DIAGNOSIS — M75121 Complete rotator cuff tear or rupture of right shoulder, not specified as traumatic: Secondary | ICD-10-CM | POA: Diagnosis not present

## 2022-05-25 DIAGNOSIS — Z01818 Encounter for other preprocedural examination: Secondary | ICD-10-CM

## 2022-05-25 DIAGNOSIS — I10 Essential (primary) hypertension: Secondary | ICD-10-CM | POA: Insufficient documentation

## 2022-05-25 DIAGNOSIS — M19011 Primary osteoarthritis, right shoulder: Secondary | ICD-10-CM | POA: Diagnosis present

## 2022-05-25 HISTORY — PX: BICEPT TENODESIS: SHX5116

## 2022-05-25 HISTORY — PX: SHOULDER ARTHROSCOPY WITH OPEN ROTATOR CUFF REPAIR AND DISTAL CLAVICLE ACROMINECTOMY: SHX5683

## 2022-05-25 SURGERY — SHOULDER ARTHROSCOPY WITH OPEN ROTATOR CUFF REPAIR AND DISTAL CLAVICLE ACROMINECTOMY
Anesthesia: General | Site: Shoulder | Laterality: Right

## 2022-05-25 MED ORDER — MORPHINE SULFATE (PF) 4 MG/ML IV SOLN
INTRAVENOUS | Status: AC
  Filled 2022-05-25: qty 2

## 2022-05-25 MED ORDER — PROPOFOL 10 MG/ML IV BOLUS
INTRAVENOUS | Status: AC
  Filled 2022-05-25: qty 20

## 2022-05-25 MED ORDER — DEXAMETHASONE SODIUM PHOSPHATE 10 MG/ML IJ SOLN
INTRAMUSCULAR | Status: DC | PRN
  Administered 2022-05-25: 8 mg via INTRAVENOUS

## 2022-05-25 MED ORDER — SODIUM CHLORIDE 0.9 % IR SOLN
Status: DC | PRN
  Administered 2022-05-25 (×4): 3000 mL

## 2022-05-25 MED ORDER — VANCOMYCIN HCL 500 MG IV SOLR
INTRAVENOUS | Status: AC
  Filled 2022-05-25: qty 10

## 2022-05-25 MED ORDER — MORPHINE SULFATE (PF) 4 MG/ML IV SOLN
INTRAVENOUS | Status: DC | PRN
  Administered 2022-05-25: 8 mg via INTRAVENOUS

## 2022-05-25 MED ORDER — MIDAZOLAM HCL 2 MG/2ML IJ SOLN
INTRAMUSCULAR | Status: DC | PRN
  Administered 2022-05-25: 2 mg via INTRAVENOUS

## 2022-05-25 MED ORDER — ONDANSETRON HCL 4 MG/2ML IJ SOLN
INTRAMUSCULAR | Status: DC | PRN
  Administered 2022-05-25: 4 mg via INTRAVENOUS

## 2022-05-25 MED ORDER — BUPIVACAINE HCL (PF) 0.25 % IJ SOLN
INTRAMUSCULAR | Status: AC
  Filled 2022-05-25: qty 30

## 2022-05-25 MED ORDER — ACETAMINOPHEN 500 MG PO TABS
1000.0000 mg | ORAL_TABLET | Freq: Once | ORAL | Status: AC
  Administered 2022-05-25: 1000 mg via ORAL
  Filled 2022-05-25: qty 2

## 2022-05-25 MED ORDER — LIDOCAINE 2% (20 MG/ML) 5 ML SYRINGE
INTRAMUSCULAR | Status: AC
  Filled 2022-05-25: qty 5

## 2022-05-25 MED ORDER — ROCURONIUM BROMIDE 10 MG/ML (PF) SYRINGE
PREFILLED_SYRINGE | INTRAVENOUS | Status: DC | PRN
  Administered 2022-05-25: 70 mg via INTRAVENOUS
  Administered 2022-05-25: 10 mg via INTRAVENOUS

## 2022-05-25 MED ORDER — POVIDONE-IODINE 7.5 % EX SOLN: Freq: Once | CUTANEOUS | Status: DC

## 2022-05-25 MED ORDER — ORAL CARE MOUTH RINSE: 15.0000 mL | Freq: Once | OROMUCOSAL | Status: AC

## 2022-05-25 MED ORDER — CLONIDINE HCL (ANALGESIA) 100 MCG/ML EP SOLN
EPIDURAL | Status: AC
  Filled 2022-05-25: qty 10

## 2022-05-25 MED ORDER — SODIUM CHLORIDE (PF) 0.9 % IJ SOLN
INTRAMUSCULAR | Status: AC
  Filled 2022-05-25: qty 50

## 2022-05-25 MED ORDER — CHLORHEXIDINE GLUCONATE 0.12 % MT SOLN: 15.0000 mL | Freq: Once | OROMUCOSAL | Status: AC

## 2022-05-25 MED ORDER — OXYCODONE HCL ER 15 MG PO T12A: 15.0000 mg | EXTENDED_RELEASE_TABLET | Freq: Two times a day (BID) | ORAL | 0 refills | Status: DC

## 2022-05-25 MED ORDER — ONDANSETRON HCL 4 MG/2ML IJ SOLN: INTRAMUSCULAR | Status: DC | PRN

## 2022-05-25 MED ORDER — CHLORHEXIDINE GLUCONATE 0.12 % MT SOLN
OROMUCOSAL | Status: AC
  Administered 2022-05-25: 15 mL via OROMUCOSAL
  Filled 2022-05-25: qty 15

## 2022-05-25 MED ORDER — BUPIVACAINE-EPINEPHRINE (PF) 0.5% -1:200000 IJ SOLN
INTRAMUSCULAR | Status: DC | PRN
  Administered 2022-05-25: 15 mL via PERINEURAL

## 2022-05-25 MED ORDER — BUPIVACAINE LIPOSOME 1.3 % IJ SUSP
INTRAMUSCULAR | Status: DC | PRN
  Administered 2022-05-25: 10 mL via PERINEURAL

## 2022-05-25 MED ORDER — EPINEPHRINE PF 1 MG/ML IJ SOLN
INTRAMUSCULAR | Status: AC
  Filled 2022-05-25: qty 1

## 2022-05-25 MED ORDER — CLONIDINE HCL (ANALGESIA) 100 MCG/ML EP SOLN
EPIDURAL | Status: DC | PRN
  Administered 2022-05-25: 1 mL

## 2022-05-25 MED ORDER — LIDOCAINE 2% (20 MG/ML) 5 ML SYRINGE
INTRAMUSCULAR | Status: DC | PRN
  Administered 2022-05-25: 100 mg via INTRAVENOUS

## 2022-05-25 MED ORDER — TRANEXAMIC ACID-NACL 1000-0.7 MG/100ML-% IV SOLN
1000.0000 mg | INTRAVENOUS | Status: AC
  Administered 2022-05-25: 1000 mg via INTRAVENOUS

## 2022-05-25 MED ORDER — BUPIVACAINE LIPOSOME 1.3 % IJ SUSP
INTRAMUSCULAR | Status: AC
  Filled 2022-05-25: qty 20

## 2022-05-25 MED ORDER — EPINEPHRINE PF 1 MG/ML IJ SOLN
INTRAMUSCULAR | Status: DC | PRN
  Administered 2022-05-25 (×2): 1 mg

## 2022-05-25 MED ORDER — EPHEDRINE SULFATE-NACL 50-0.9 MG/10ML-% IV SOSY
PREFILLED_SYRINGE | INTRAVENOUS | Status: DC | PRN
  Administered 2022-05-25: 5 mg via INTRAVENOUS
  Administered 2022-05-25: 10 mg via INTRAVENOUS

## 2022-05-25 MED ORDER — AMISULPRIDE (ANTIEMETIC) 5 MG/2ML IV SOLN: 10.0000 mg | Freq: Once | INTRAVENOUS | Status: DC | PRN

## 2022-05-25 MED ORDER — GLYCOPYRROLATE 0.2 MG/ML IJ SOLN
INTRAMUSCULAR | Status: DC | PRN
  Administered 2022-05-25 (×2): .1 mg via INTRAVENOUS

## 2022-05-25 MED ORDER — PHENYLEPHRINE HCL-NACL 20-0.9 MG/250ML-% IV SOLN
INTRAVENOUS | Status: DC | PRN
  Administered 2022-05-25: 75 ug/min via INTRAVENOUS
  Administered 2022-05-25: 40 ug/min via INTRAVENOUS

## 2022-05-25 MED ORDER — BUPIVACAINE HCL (PF) 0.25 % IJ SOLN
INTRAMUSCULAR | Status: DC | PRN
  Administered 2022-05-25: 30 mL

## 2022-05-25 MED ORDER — LACTATED RINGERS IV SOLN: INTRAVENOUS | Status: DC

## 2022-05-25 MED ORDER — ONDANSETRON HCL 4 MG/2ML IJ SOLN
INTRAMUSCULAR | Status: AC
  Filled 2022-05-25: qty 4

## 2022-05-25 MED ORDER — SUGAMMADEX SODIUM 200 MG/2ML IV SOLN
INTRAVENOUS | Status: DC | PRN
  Administered 2022-05-25: 200 mg via INTRAVENOUS

## 2022-05-25 MED ORDER — VANCOMYCIN HCL 1000 MG IV SOLR
INTRAVENOUS | Status: AC
  Filled 2022-05-25: qty 20

## 2022-05-25 MED ORDER — CEPHALEXIN 500 MG PO CAPS: 1000.0000 mg | ORAL_CAPSULE | Freq: Once | ORAL | 0 refills | Status: AC

## 2022-05-25 MED ORDER — BUPIVACAINE HCL (PF) 0.5 % IJ SOLN
INTRAMUSCULAR | Status: AC
  Filled 2022-05-25: qty 30

## 2022-05-25 MED ORDER — PHENYLEPHRINE HCL (PRESSORS) 10 MG/ML IV SOLN
INTRAVENOUS | Status: AC
  Filled 2022-05-25: qty 1

## 2022-05-25 MED ORDER — FENTANYL CITRATE (PF) 250 MCG/5ML IJ SOLN
INTRAMUSCULAR | Status: DC | PRN
  Administered 2022-05-25 (×2): 50 ug via INTRAVENOUS
  Administered 2022-05-25: 100 ug via INTRAVENOUS

## 2022-05-25 MED ORDER — POVIDONE-IODINE 10 % EX SWAB
2.0000 | Freq: Once | CUTANEOUS | Status: AC
  Administered 2022-05-25: 2 via TOPICAL

## 2022-05-25 MED ORDER — FENTANYL CITRATE (PF) 100 MCG/2ML IJ SOLN: 25.0000 ug | INTRAMUSCULAR | Status: DC | PRN

## 2022-05-25 MED ORDER — FENTANYL CITRATE (PF) 250 MCG/5ML IJ SOLN
INTRAMUSCULAR | Status: AC
  Filled 2022-05-25: qty 5

## 2022-05-25 MED ORDER — ROCURONIUM BROMIDE 10 MG/ML (PF) SYRINGE
PREFILLED_SYRINGE | INTRAVENOUS | Status: AC
  Filled 2022-05-25: qty 10

## 2022-05-25 MED ORDER — VANCOMYCIN HCL 1000 MG IV SOLR
INTRAVENOUS | Status: DC | PRN
  Administered 2022-05-25: 1000 mg via TOPICAL
  Administered 2022-05-25: 500 mg via TOPICAL

## 2022-05-25 MED ORDER — PROPOFOL 10 MG/ML IV BOLUS
INTRAVENOUS | Status: DC | PRN
  Administered 2022-05-25: 200 mg via INTRAVENOUS
  Administered 2022-05-25: 50 mg via INTRAVENOUS

## 2022-05-25 MED ORDER — OXYCODONE HCL 5 MG/5ML PO SOLN: 5.0000 mg | Freq: Once | ORAL | Status: DC | PRN

## 2022-05-25 MED ORDER — DEXAMETHASONE SODIUM PHOSPHATE 10 MG/ML IJ SOLN
INTRAMUSCULAR | Status: AC
  Filled 2022-05-25: qty 2

## 2022-05-25 MED ORDER — TRANEXAMIC ACID-NACL 1000-0.7 MG/100ML-% IV SOLN
INTRAVENOUS | Status: AC
  Filled 2022-05-25: qty 100

## 2022-05-25 MED ORDER — MIDAZOLAM HCL 2 MG/2ML IJ SOLN
INTRAMUSCULAR | Status: AC
  Filled 2022-05-25: qty 2

## 2022-05-25 MED ORDER — CEFAZOLIN SODIUM-DEXTROSE 2-4 GM/100ML-% IV SOLN
INTRAVENOUS | Status: AC
  Filled 2022-05-25: qty 100

## 2022-05-25 MED ORDER — CEFAZOLIN SODIUM-DEXTROSE 2-4 GM/100ML-% IV SOLN
2.0000 g | INTRAVENOUS | Status: AC
  Administered 2022-05-25 (×2): 2 g via INTRAVENOUS

## 2022-05-25 MED ORDER — FENTANYL CITRATE (PF) 100 MCG/2ML IJ SOLN
INTRAMUSCULAR | Status: AC
  Filled 2022-05-25: qty 2

## 2022-05-25 MED ORDER — OXYCODONE HCL 5 MG PO TABS: 5.0000 mg | ORAL_TABLET | Freq: Once | ORAL | Status: DC | PRN

## 2022-05-25 SURGICAL SUPPLY — 77 items
ANCHOR FBRTK 2.6 SUTURETAP 1.3 (Anchor) IMPLANT
ANCHOR SUT 1.8 FIBERTAK SB KL (Anchor) IMPLANT
ANCHOR SWIVELOCK BIO 4.75X19.1 (Anchor) IMPLANT
BAG COUNTER SPONGE SURGICOUNT (BAG) ×2 IMPLANT
BLADE EXCALIBUR 4.0X13 (MISCELLANEOUS) IMPLANT
BLADE SURG 11 STRL SS (BLADE) ×2 IMPLANT
BUR OVAL 6.0 (BURR) IMPLANT
BURR OVAL 8 FLU 4.0X13 (MISCELLANEOUS) IMPLANT
CLSR STERI-STRIP ANTIMIC 1/2X4 (GAUZE/BANDAGES/DRESSINGS) IMPLANT
COVER SURGICAL LIGHT HANDLE (MISCELLANEOUS) ×2 IMPLANT
DRAPE INCISE IOBAN 66X45 STRL (DRAPES) ×4 IMPLANT
DRAPE STERI 35X30 U-POUCH (DRAPES) ×2 IMPLANT
DRAPE U-SHAPE 47X51 STRL (DRAPES) ×4 IMPLANT
DRESSING AQUACEL AG SP 3.5X6 (GAUZE/BANDAGES/DRESSINGS) IMPLANT
DRSG AQUACEL AG SP 3.5X6 (GAUZE/BANDAGES/DRESSINGS) ×4
DRSG TEGADERM 4X4.75 (GAUZE/BANDAGES/DRESSINGS) ×8 IMPLANT
DURAPREP 26ML APPLICATOR (WOUND CARE) ×2 IMPLANT
ELECT REM PT RETURN 9FT ADLT (ELECTROSURGICAL) ×2
ELECTRODE REM PT RTRN 9FT ADLT (ELECTROSURGICAL) ×2 IMPLANT
FILTER STRAW FLUID ASPIR (MISCELLANEOUS) ×2 IMPLANT
GAUZE PAD ABD 8X10 STRL (GAUZE/BANDAGES/DRESSINGS) ×6 IMPLANT
GAUZE SPONGE 4X4 12PLY STRL LF (GAUZE/BANDAGES/DRESSINGS) ×2 IMPLANT
GAUZE XEROFORM 1X8 LF (GAUZE/BANDAGES/DRESSINGS) ×2 IMPLANT
GAUZE XEROFORM 5X9 LF (GAUZE/BANDAGES/DRESSINGS) IMPLANT
GLOVE BIOGEL PI IND STRL 8 (GLOVE) ×2 IMPLANT
GLOVE ECLIPSE 8.0 STRL XLNG CF (GLOVE) ×2 IMPLANT
GOWN STRL REUS W/ TWL LRG LVL3 (GOWN DISPOSABLE) ×6 IMPLANT
GOWN STRL REUS W/TWL LRG LVL3 (GOWN DISPOSABLE) ×6
GRAFT ACHILLES CALC BNE BLCK (Bone Implant) IMPLANT
GRAFT ACHILLES TENDON (Bone Implant) ×2 IMPLANT
JET LAVAGE IRRISEPT WOUND (IRRIGATION / IRRIGATOR) ×2
KIT BASIN OR (CUSTOM PROCEDURE TRAY) ×2 IMPLANT
KIT SHOULDER STAB MARCO (KITS) IMPLANT
KIT STR SPEAR 1.8 FBRTK DISP (KITS) IMPLANT
KIT TURNOVER KIT B (KITS) ×2 IMPLANT
LAVAGE JET IRRISEPT WOUND (IRRIGATION / IRRIGATOR) IMPLANT
MANIFOLD NEPTUNE II (INSTRUMENTS) ×2 IMPLANT
NDL HYPO 25X1 1.5 SAFETY (NEEDLE) ×2 IMPLANT
NDL SCORPION MULTI FIRE (NEEDLE) IMPLANT
NDL SPNL 18GX3.5 QUINCKE PK (NEEDLE) ×2 IMPLANT
NDL SUT 6 .5 CRC .975X.05 MAYO (NEEDLE) IMPLANT
NEEDLE HYPO 25X1 1.5 SAFETY (NEEDLE) ×2 IMPLANT
NEEDLE MAYO TAPER (NEEDLE)
NEEDLE SCORPION MULTI FIRE (NEEDLE) ×2 IMPLANT
NEEDLE SPNL 18GX3.5 QUINCKE PK (NEEDLE) ×2 IMPLANT
NS IRRIG 1000ML POUR BTL (IV SOLUTION) ×2 IMPLANT
PACK SHOULDER (CUSTOM PROCEDURE TRAY) ×2 IMPLANT
PAD ARMBOARD 7.5X6 YLW CONV (MISCELLANEOUS) ×4 IMPLANT
PORT APPOLLO RF 90DEGREE MULTI (SURGICAL WAND) IMPLANT
PROBE APOLLO 90XL (SURGICAL WAND) IMPLANT
RESTRAINT HEAD UNIVERSAL NS (MISCELLANEOUS) ×2 IMPLANT
SLING ARM IMMOBILIZER MED (SOFTGOODS) IMPLANT
SPONGE T-LAP 4X18 ~~LOC~~+RFID (SPONGE) ×4 IMPLANT
STRIP CLOSURE SKIN 1/2X4 (GAUZE/BANDAGES/DRESSINGS) ×2 IMPLANT
SUCTION FRAZIER HANDLE 10FR (MISCELLANEOUS) ×2
SUCTION TUBE FRAZIER 10FR DISP (MISCELLANEOUS) ×2 IMPLANT
SUPPORT WRAP ARM LG (MISCELLANEOUS) IMPLANT
SUT ETHILON 3 0 PS 1 (SUTURE) ×2 IMPLANT
SUT FIBERWIRE #2 38 T-5 BLUE (SUTURE)
SUT MNCRL AB 3-0 PS2 18 (SUTURE) ×2 IMPLANT
SUT VIC AB 0 CT1 27 (SUTURE) ×8
SUT VIC AB 0 CT1 27XBRD ANBCTR (SUTURE) ×2 IMPLANT
SUT VIC AB 1 CT1 27 (SUTURE) ×16
SUT VIC AB 1 CT1 27XBRD ANBCTR (SUTURE) ×2 IMPLANT
SUT VIC AB 2-0 CT1 27 (SUTURE) ×12
SUT VIC AB 2-0 CT1 TAPERPNT 27 (SUTURE) ×2 IMPLANT
SUT VICRYL 0 UR6 27IN ABS (SUTURE) IMPLANT
SUTURE FIBERWR #2 38 T-5 BLUE (SUTURE) IMPLANT
SYR 20ML LL LF (SYRINGE) ×4 IMPLANT
SYR 3ML LL SCALE MARK (SYRINGE) ×2 IMPLANT
SYR TB 1ML LUER SLIP (SYRINGE) ×2 IMPLANT
SYS FBRTK BUTTON 2.6 (Anchor) ×2 IMPLANT
SYSTEM FBRTK BUTTON 2.6 (Anchor) IMPLANT
TOWEL GREEN STERILE (TOWEL DISPOSABLE) ×2 IMPLANT
TOWEL GREEN STERILE FF (TOWEL DISPOSABLE) ×2 IMPLANT
TUBING ARTHROSCOPY IRRIG 16FT (MISCELLANEOUS) ×2 IMPLANT
WATER STERILE IRR 1000ML POUR (IV SOLUTION) ×2 IMPLANT

## 2022-05-25 NOTE — Brief Op Note (Signed)
   05/25/2022  6:17 PM  PATIENT:  Tyler Hart  61 y.o. male  PRE-OPERATIVE DIAGNOSIS:  right shoulder massive rotator cuff tear, biceps tendonitis, acromioclavicular joint arthritis  POST-OPERATIVE DIAGNOSIS:  right shoulder massive rotator cuff tear, biceps tendonitis, acromioclavicular joint arthritis  PROCEDURE:  Procedure(s): right shoulder arthroscopy, debridement, distal clavicle excision,  lower trapezius tendon transfer biceps tenodesis  SURGEON:  Surgeon(s): Meredith Pel, MD  ASSISTANT: magnant pa  ANESTHESIA:   general  EBL: 50 ml    Total I/O In: 1010 [I.V.:1000; IV Piggyback:10] Out: -   BLOOD ADMINISTERED: none  DRAINS: none   LOCAL MEDICATIONS USED:  marcaine mso4 clonidine  SPECIMEN:  No Specimen  COUNTS:  YES  TOURNIQUET:  * No tourniquets in log *  DICTATION: .Other Dictation: Dictation Number [id number]  PLAN OF CARE: Discharge to home after PACU  PATIENT DISPOSITION:  PACU - hemodynamically stable

## 2022-05-25 NOTE — Anesthesia Procedure Notes (Signed)
Anesthesia Regional Block: Interscalene brachial plexus block   Pre-Anesthetic Checklist: , timeout performed,  Correct Patient, Correct Site, Correct Laterality,  Correct Procedure, Correct Position, site marked,  Risks and benefits discussed,  Pre-op evaluation,  At surgeon's request and post-op pain management  Laterality: Right  Prep: Maximum Sterile Barrier Precautions used, chloraprep       Needles:  Injection technique: Single-shot  Needle Type: Echogenic Stimulator Needle     Needle Length: 4cm  Needle Gauge: 22     Additional Needles:   Procedures:,,,, ultrasound used (permanent image in chart),,    Narrative:  Start time: 05/25/2022 11:17 AM End time: 05/25/2022 11:20 AM Injection made incrementally with aspirations every 5 mL.  Performed by: Personally  Anesthesiologist: Brennan Bailey, MD  Additional Notes: Risks, benefits, and alternative discussed. Patient gave consent for procedure. Patient prepped and draped in sterile fashion. Sedation administered, patient remains easily responsive to voice. Relevant anatomy identified with ultrasound guidance. Local anesthetic given in 5cc increments with no signs or symptoms of intravascular injection. No pain or paraesthesias with injection. Patient monitored throughout procedure with signs of LAST or immediate complications. Tolerated well. Ultrasound image placed in chart.  Tawny Asal, MD

## 2022-05-25 NOTE — Transfer of Care (Signed)
Immediate Anesthesia Transfer of Care Note  Patient: Tyler Hart  Procedure(s) Performed: right shoulder arthroscopy, debridement, distal clavicle excision, mini open rotator cuff tear repair vs lower trapezius tendon transfer (Right: Shoulder) biceps tenodesis (Right: Arm Upper)  Patient Location: PACU  Anesthesia Type:GA combined with regional for post-op pain  Level of Consciousness: awake, alert  and oriented  Airway & Oxygen Therapy: Patient Spontanous Breathing and Patient connected to nasal cannula oxygen  Post-op Assessment: Report given to RN and Post -op Vital signs reviewed and stable  Post vital signs: Reviewed and stable  Last Vitals:  Vitals Value Taken Time  BP    Temp    Pulse 103 05/25/22 1818  Resp 17 05/25/22 1818  SpO2 100 % 05/25/22 1818  Vitals shown include unvalidated device data.  Last Pain:  Vitals:   05/25/22 1032  TempSrc:   PainSc: 7          Complications: No notable events documented.

## 2022-05-25 NOTE — Op Note (Signed)
Tyler Hart, SENGER MEDICAL RECORD NO: [id number] ACCOUNT NO: [id number] DATE OF BIRTH: 1961/07/20 FACILITY: [facility] LOCATION: [facility] PHYSICIAN: Yetta Barre. Marlou Sa, MD  Operative Report   DATE OF PROCEDURE: 05/25/2022  PREOPERATIVE DIAGNOSES:  Right shoulder massive rotator cuff tear, biceps tendinitis and acromioclavicular joint arthritis.  POSTOPERATIVE DIAGNOSES:  Right shoulder massive rotator cuff tear, biceps tendinitis, and acromioclavicular joint arthritis.  PROCEDURE:  Right shoulder arthroscopy with limited debridement of the superior labrum, biceps tendon release, arthroscopic distal clavicle excision, mini open biceps tenodesis and lower trapezius tendon transfer.  SURGEON ATTENDING:  Yetta Barre. Marlou Sa, MD  ASSISTANT:  Annie Main, PA  INDICATIONS:  The patient is a 61 year old patient with significant right shoulder pain and weakness, who presents for operative management after explanation of risks and benefits.  He has a massive rotator cuff tear with some atrophy of the  infraspinatus muscle belly and retraction of the supraspinatus medial to the apex of the humeral head by MRI scanning 6 months ago.  DESCRIPTION OF PROCEDURE:  The patient was brought to the operating room where general anesthetic was induced.  Preoperative antibiotics administered.  Timeout was called.  The patient was placed in the beach chair position with head in neutral position.   The patient was shifted about 2-3 cm to the right in order to facilitate access to the scapula if needed.  Right shoulder, arm and hand were then prescrubbed with alcohol and Betadine, allowed to air dry, prepped with DuraPrep solution and draped in  sterile manner.  Charlie Pitter was used to seal the operative field and cover the axilla.  Timeout was called.  Examination under anesthesia demonstrated range of motion of 65/100/160.  Posterior portal was created.  Anterior portal created under direct  visualization in line with the  distal clavicle.  The patient had massive rotator cuff tear involving both the supraspinatus and infraspinatus with retraction.  Glenohumeral articular surfaces intact.  Subscap intact.  The biceps tendon was released with  the Arthrocare wand. Superior debridement was performed.  Lateral portal was created.  The tendon mobilized poorly, which was the supraspinatus and infraspinatus tendon.  After debridement and biceps tendon release, instruments were removed.  Portals  were closed using 3-0 nylon.  Ioban used to cover the rest of the operative field.  Incision made off the anterolateral margin of the acromion.  Skin and subcutaneous tissue sharply divided.  Deltoid was split a measured distance of 4 cm from the  anterolateral margin of the acromion. Prior to opening the shoulder, the distal clavicle was excised approximately 8-9 mm with the arthroscopic bur, maintaining the posterior and superior ligaments.  At that time, the open portion of the procedure was  performed.  Deltoid split was made between the anterior and middle raphae, marked with a #1 Vicryl suture.  Biceps tendon was then tenodesed into the groove using 2 Arthrex knotless suture anchors.  Done under appropriate tension after placing a  FiberLoop through the tendon.  Next, the distal clavicle excision was appropriate.  At this time, an acromioplasty performed.  Next, an effort was made to mobilize the supraspinatus and infraspinatus tendon, but the tendon was torn and retracted.  There  was no chance for repair.  Next, a decision was made to proceed with lower trapezius tendon transfer.  At this time on the back table Achilles allograft was prepared, placing a grasping suture in the end of the tendon, which was dissected free from the  bone.  The suture was Arthrex  SutureTape placed in grasping fashion.  Next, the footprint was prepared.  The patient had a large footprint.  Attention was then directed towards the posterior aspect of the  scapula.  Incision made just below the scapular  spine remaining just lateral to the medial border of the scapula.  Skin and subcutaneous tissue were sharply divided.  The lower trapezius tendon was identified and dissected off of the medial border of the scapula.  Tagged with suture and mobilized  manually.  Care was taken to avoid injury to the spinal accessory nerve.  Next, the fascia overlying the infraspinatus fascia was incised and a passing suture was placed from posterior to anterior.  At this time, the prepared graft, which had soaked in  vancomycin solution was placed into the operative field first posteriorly and then it was passed from anterior to posterior using the suture loop.  Next, using a SwiveLock anteriorly, the graft was tethered into the anterior aspect of the greater  tuberosity footprint.  Then, using 2 knotless SutureTapes at the junction of greater tuberosity and the humeral head articular surface, the 4 SutureTapes were placed through the medial edge of the Achilles tendon allograft.  These sutures were tied and  then brought down and tensioned into SwiveLocks.  The anterior suture pulled out of the bone and that was replaced with a SwiveLock in 4 SutureTapes and then placed back into a more posterior aspect into a SwiveLock for fixation.  All in all, very secure  fixation was achieved with a large footprint of the Achilles allograft onto the native footprint of the supraspinatus and infraspinatus.  At this time, the arm was placed with the McConnell positioner in 60 degrees abduction, 60 degrees of external  rotation.  A small longitudinal slit was made into the lower trapezius tendon and the Achilles allograft was passed and under appropriate tension was sutured, maximizing surface area contact using SutureTape x12 sutures.  This gave a very nice repair  under appropriate tension.  Next, thorough irrigation was performed on all incisions.  The deltoid split was closed using #1  Vicryl suture followed by interrupted inverted 0 Vicryl suture, 2-0 Vicryl suture, and 3-0 Monocryl.  The posterior incision  closed using #1 Vicryl suture, 0 Vicryl suture, 2-0 Vicryl suture, and 3-0 Monocryl.  It should be noted that IrriSept solution was used at all times during the case as well as vancomycin powder placed into both incisions.  Impervious dressings were then  placed.  The patient was then placed into a special brace with the arm externally rotated and adducted the appropriate amount 60 degrees and 60 degrees.  The patient was then transferred to the recovery room in stable condition.  Luke's assistance was  required at all times during the case for opening, closing, retraction, graft preparation, drilling.  His assistance was a medical necessity.   VAI D: 05/25/2022 6:26:08 pm T: 05/25/2022 9:34:00 pm  JOB: [id number]/ [id number]

## 2022-05-25 NOTE — Anesthesia Preprocedure Evaluation (Addendum)
Anesthesia Evaluation  Patient identified by MRN, date of birth, ID band Patient awake    Reviewed: Allergy & Precautions, NPO status , Patient's Chart, lab work & pertinent test results  History of Anesthesia Complications Negative for: history of anesthetic complications  Airway Mallampati: II  TM Distance: >3 FB Neck ROM: Full    Dental no notable dental hx.    Pulmonary neg pulmonary ROS,    Pulmonary exam normal        Cardiovascular hypertension, Normal cardiovascular exam     Neuro/Psych negative neurological ROS  negative psych ROS   GI/Hepatic (+) Hepatitis -, C  Endo/Other  negative endocrine ROS  Renal/GU negative Renal ROS  negative genitourinary   Musculoskeletal right shoulder massive rotator cuff tear, biceps tendonitis, acromioclavicular joint arthritis   Abdominal   Peds  Hematology negative hematology ROS (+)   Anesthesia Other Findings Day of surgery medications reviewed with patient.  Reproductive/Obstetrics negative OB ROS                            Anesthesia Physical Anesthesia Plan  ASA: 2  Anesthesia Plan: General   Post-op Pain Management: Tylenol PO (pre-op)* and Regional block*   Induction: Intravenous  PONV Risk Score and Plan: 2 and Treatment may vary due to age or medical condition, Ondansetron, Dexamethasone and Midazolam  Airway Management Planned: Oral ETT  Additional Equipment: None  Intra-op Plan:   Post-operative Plan: Extubation in OR  Informed Consent: I have reviewed the patients History and Physical, chart, labs and discussed the procedure including the risks, benefits and alternatives for the proposed anesthesia with the patient or authorized representative who has indicated his/her understanding and acceptance.     Dental advisory given  Plan Discussed with: CRNA  Anesthesia Plan Comments:        Anesthesia Quick  Evaluation

## 2022-05-25 NOTE — Anesthesia Procedure Notes (Signed)
Procedure Name: Intubation Date/Time: 05/25/2022 12:34 PM  Performed by: Leonor Liv, CRNAPre-anesthesia Checklist: Patient identified, Emergency Drugs available, Suction available and Patient being monitored Patient Re-evaluated:Patient Re-evaluated prior to induction Oxygen Delivery Method: Circle System Utilized Preoxygenation: Pre-oxygenation with 100% oxygen Induction Type: IV induction Ventilation: Mask ventilation without difficulty Laryngoscope Size: Mac and 4 Grade View: Grade I Tube type: Oral Tube size: 7.5 mm Number of attempts: 1 Airway Equipment and Method: Stylet Placement Confirmation: ETT inserted through vocal cords under direct vision, positive ETCO2 and breath sounds checked- equal and bilateral Secured at: 22 cm Tube secured with: Tape (left) Dental Injury: Teeth and Oropharynx as per pre-operative assessment  Comments: Intubation performed by Laurine Blazer RN [facility] under direct supervision.

## 2022-05-25 NOTE — Anesthesia Postprocedure Evaluation (Signed)
Anesthesia Post Note  Patient: COREE BRAME  Procedure(s) Performed: right shoulder arthroscopy, debridement, distal clavicle excision, mini open rotator cuff tear repair vs lower trapezius tendon transfer (Right: Shoulder) biceps tenodesis (Right: Arm Upper)     Patient location during evaluation: PACU Anesthesia Type: General Level of consciousness: awake and alert Pain management: pain level controlled Vital Signs Assessment: post-procedure vital signs reviewed and stable Respiratory status: spontaneous breathing, nonlabored ventilation, respiratory function stable and patient connected to nasal cannula oxygen Cardiovascular status: blood pressure returned to baseline and stable Postop Assessment: no apparent nausea or vomiting Anesthetic complications: no   No notable events documented.  Last Vitals:  Vitals:   05/25/22 1845 05/25/22 1900  BP: 126/81 112/70  Pulse: (!) 102 97  Resp: 15 19  Temp:  36.7 C  SpO2: 100% 100%                  Effie Berkshire
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
## 2022-05-26 ENCOUNTER — Other Ambulatory Visit: Payer: Self-pay | Admitting: Surgical

## 2022-05-26 ENCOUNTER — Telehealth: Payer: Self-pay

## 2022-05-26 ENCOUNTER — Telehealth: Payer: Self-pay | Admitting: Orthopedic Surgery

## 2022-05-26 ENCOUNTER — Encounter (HOSPITAL_COMMUNITY): Payer: Self-pay | Admitting: Orthopedic Surgery

## 2022-05-26 MED ORDER — OXYCODONE HCL ER 15 MG PO T12A: 15.0000 mg | EXTENDED_RELEASE_TABLET | Freq: Two times a day (BID) | ORAL | 0 refills | Status: DC

## 2022-05-26 NOTE — Telephone Encounter (Signed)
Called and cleared it up

## 2022-05-26 NOTE — Telephone Encounter (Signed)
Pt called requesting a call back. Pt states he has no pain meds. Please call pt about this matter at [phone]

## 2022-05-26 NOTE — Telephone Encounter (Signed)
Patient needs meds oxycodon '15mg'$  sent to pharmacy  [facility]

## 2022-05-26 NOTE — Telephone Encounter (Signed)
LMVM advising 

## 2022-05-26 NOTE — Telephone Encounter (Signed)
Janett Billow with [facility] called needing the ok to fill pt's percocet due to him just picking the [facility] a [facility]. Please call her back @ [phone]

## 2022-05-26 NOTE — Telephone Encounter (Signed)
Sent in. Can you make sure he is aware he should contact his pain management doctor about this RX for his srugery?

## 2022-06-02 ENCOUNTER — Ambulatory Visit (INDEPENDENT_AMBULATORY_CARE_PROVIDER_SITE_OTHER): Payer: Medicare Other | Admitting: Orthopedic Surgery

## 2022-06-02 ENCOUNTER — Encounter: Payer: Self-pay | Admitting: Orthopedic Surgery

## 2022-06-02 DIAGNOSIS — S46011D Strain of muscle(s) and tendon(s) of the rotator cuff of right shoulder, subsequent encounter: Secondary | ICD-10-CM
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
## 2022-06-11 DIAGNOSIS — M7521 Bicipital tendinitis, right shoulder: Secondary | ICD-10-CM

## 2022-06-11 DIAGNOSIS — M12811 Other specific arthropathies, not elsewhere classified, right shoulder: Secondary | ICD-10-CM

## 2022-06-11 DIAGNOSIS — M19011 Primary osteoarthritis, right shoulder: Secondary | ICD-10-CM

## 2022-06-11 DIAGNOSIS — S43431A Superior glenoid labrum lesion of right shoulder, initial encounter: Secondary | ICD-10-CM

## 2022-06-11 DIAGNOSIS — M75101 Unspecified rotator cuff tear or rupture of right shoulder, not specified as traumatic: Secondary | ICD-10-CM

## 2022-06-12 ENCOUNTER — Ambulatory Visit: Payer: Medicare Other | Attending: Internal Medicine | Admitting: Internal Medicine

## 2022-06-12 DIAGNOSIS — S46011S Strain of muscle(s) and tendon(s) of the rotator cuff of right shoulder, sequela: Secondary | ICD-10-CM

## 2022-06-12 DIAGNOSIS — M1A09X1 Idiopathic chronic gout, multiple sites, with tophus (tophi): Secondary | ICD-10-CM

## 2022-06-12 DIAGNOSIS — R768 Other specified abnormal immunological findings in serum: Secondary | ICD-10-CM

## 2022-06-23 ENCOUNTER — Encounter: Payer: Self-pay | Admitting: Orthopedic Surgery

## 2022-06-23 ENCOUNTER — Ambulatory Visit (INDEPENDENT_AMBULATORY_CARE_PROVIDER_SITE_OTHER): Payer: Medicare Other | Admitting: Surgical

## 2022-06-23 DIAGNOSIS — S46011D Strain of muscle(s) and tendon(s) of the rotator cuff of right shoulder, subsequent encounter: Secondary | ICD-10-CM
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
## 2022-07-03 ENCOUNTER — Ambulatory Visit (HOSPITAL_COMMUNITY): Payer: Medicare Other | Attending: Internal Medicine | Admitting: Occupational Therapy

## 2022-07-03 DIAGNOSIS — R29898 Other symptoms and signs involving the musculoskeletal system: Secondary | ICD-10-CM | POA: Diagnosis present

## 2022-07-03 DIAGNOSIS — M25611 Stiffness of right shoulder, not elsewhere classified: Secondary | ICD-10-CM | POA: Insufficient documentation

## 2022-07-03 DIAGNOSIS — M25511 Pain in right shoulder: Secondary | ICD-10-CM | POA: Insufficient documentation

## 2022-07-03 DIAGNOSIS — G8929 Other chronic pain: Secondary | ICD-10-CM | POA: Diagnosis present
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
## 2022-07-05 ENCOUNTER — Encounter (HOSPITAL_COMMUNITY): Payer: Self-pay | Admitting: Occupational Therapy

## 2022-07-10 ENCOUNTER — Encounter (HOSPITAL_COMMUNITY): Payer: Self-pay | Admitting: Occupational Therapy

## 2022-07-10 ENCOUNTER — Ambulatory Visit (HOSPITAL_COMMUNITY): Payer: Medicare Other | Admitting: Occupational Therapy

## 2022-07-10 DIAGNOSIS — M25611 Stiffness of right shoulder, not elsewhere classified: Secondary | ICD-10-CM

## 2022-07-10 DIAGNOSIS — M25511 Pain in right shoulder: Secondary | ICD-10-CM

## 2022-07-10 DIAGNOSIS — R29898 Other symptoms and signs involving the musculoskeletal system: Secondary | ICD-10-CM | POA: Diagnosis not present

## 2022-07-10 DIAGNOSIS — G8929 Other chronic pain: Secondary | ICD-10-CM

## 2022-07-10 NOTE — Patient Instructions (Signed)
Perform each exercise ____10-15____ reps. 2-3x days. COMPLETE IN SUPINE (LAYING DOWN) FOR NOW.  1) Protraction   Start by holding a wand or cane at chest height.  Next, slowly push the wand outwards in front of your body so that your elbows become fully straightened. Then, return to the original position.     2) Shoulder FLEXION   In the standing position, hold a wand/cane with both arms, palms down on both sides. Raise up the wand/cane allowing your unaffected arm to perform most of the effort. Your affected arm should be partially relaxed.      3) Internal/External ROTATION   In the standing position, hold a wand/cane with both hands keeping your elbows bent. Move your arms and wand/cane to one side.  Your affected arm should be partially relaxed while your unaffected arm performs most of the effort.       4) Shoulder ABDUCTION   While holding a wand/cane palm face up on the injured side and palm face down on the uninjured side, slowly raise up your injured arm to the side.        5) Horizontal Abduction/Adduction      Straight arms holding cane at shoulder height, bring cane to right, center, left. Repeat starting to left.   Copyright  VHI. All rights reserved.
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
## 2022-07-14 ENCOUNTER — Encounter (HOSPITAL_COMMUNITY): Payer: Self-pay | Admitting: Occupational Therapy

## 2022-07-14 ENCOUNTER — Ambulatory Visit (HOSPITAL_COMMUNITY): Payer: Medicare Other | Attending: Internal Medicine | Admitting: Occupational Therapy

## 2022-07-14 DIAGNOSIS — M25611 Stiffness of right shoulder, not elsewhere classified: Secondary | ICD-10-CM | POA: Diagnosis present

## 2022-07-14 DIAGNOSIS — M25511 Pain in right shoulder: Secondary | ICD-10-CM | POA: Diagnosis present

## 2022-07-14 DIAGNOSIS — R29898 Other symptoms and signs involving the musculoskeletal system: Secondary | ICD-10-CM | POA: Diagnosis present
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
## 2022-07-21 ENCOUNTER — Ambulatory Visit (HOSPITAL_COMMUNITY): Payer: Medicare Other | Admitting: Occupational Therapy

## 2022-07-21 ENCOUNTER — Encounter (HOSPITAL_COMMUNITY): Payer: Self-pay | Admitting: Occupational Therapy

## 2022-07-21 ENCOUNTER — Ambulatory Visit (INDEPENDENT_AMBULATORY_CARE_PROVIDER_SITE_OTHER): Payer: Medicare Other | Admitting: Orthopedic Surgery

## 2022-07-21 DIAGNOSIS — M25511 Pain in right shoulder: Secondary | ICD-10-CM

## 2022-07-21 DIAGNOSIS — M25611 Stiffness of right shoulder, not elsewhere classified: Secondary | ICD-10-CM

## 2022-07-21 DIAGNOSIS — R29898 Other symptoms and signs involving the musculoskeletal system: Secondary | ICD-10-CM

## 2022-07-21 DIAGNOSIS — S46011D Strain of muscle(s) and tendon(s) of the rotator cuff of right shoulder, subsequent encounter: Secondary | ICD-10-CM

## 2022-07-21 MED ORDER — OXYCODONE HCL 5 MG PO TABS: 10.0000 mg | ORAL_TABLET | Freq: Four times a day (QID) | ORAL | 0 refills | Status: DC | PRN
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
## 2022-07-22 ENCOUNTER — Encounter: Payer: Self-pay | Admitting: Orthopedic Surgery
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
## 2022-07-23 ENCOUNTER — Encounter (HOSPITAL_COMMUNITY): Payer: Self-pay | Admitting: Occupational Therapy

## 2022-07-24 ENCOUNTER — Ambulatory Visit (HOSPITAL_COMMUNITY): Payer: Medicare Other | Admitting: Occupational Therapy

## 2022-07-24 ENCOUNTER — Encounter (HOSPITAL_COMMUNITY): Payer: Self-pay

## 2022-07-24 DIAGNOSIS — R29898 Other symptoms and signs involving the musculoskeletal system: Secondary | ICD-10-CM

## 2022-07-24 DIAGNOSIS — M25611 Stiffness of right shoulder, not elsewhere classified: Secondary | ICD-10-CM

## 2022-07-24 DIAGNOSIS — M25511 Pain in right shoulder: Secondary | ICD-10-CM

## 2022-07-24 NOTE — Therapy (Signed)
Patient arrived 14 mins early. Once seated, OT greeted pt and told him she would be with him shortly. Before appointment time started at 1430, pt stood up and said he had to go that he had another appointment scheduled and needed to leave. Patient informed of following visit on 12/13 at 1345.   Tyler Hart, OTR/L [facility]

## 2022-07-26 ENCOUNTER — Encounter (HOSPITAL_COMMUNITY): Payer: Medicare Other | Admitting: Occupational Therapy

## 2022-08-01 ENCOUNTER — Encounter (HOSPITAL_COMMUNITY): Payer: Medicare Other | Admitting: Occupational Therapy

## 2022-08-01 ENCOUNTER — Telehealth (HOSPITAL_COMMUNITY): Payer: Self-pay | Admitting: Occupational Therapy

## 2022-08-01 NOTE — Telephone Encounter (Signed)
Called pt regarding no-show for today's appointment at 1:00. Pt reports he will be here on Thursday, reminded him of the time-2:30. Asked pt to call if unable to attend.    Tyler Hart, OTR/L  [phone] 08/01/22

## 2022-08-03 ENCOUNTER — Encounter (HOSPITAL_COMMUNITY): Payer: Medicare Other | Admitting: Occupational Therapy

## 2022-08-03 ENCOUNTER — Ambulatory Visit: Payer: Medicare Other | Admitting: Orthopedic Surgery

## 2022-08-08 ENCOUNTER — Ambulatory Visit (HOSPITAL_COMMUNITY): Payer: Medicare Other | Admitting: Occupational Therapy

## 2022-08-08 ENCOUNTER — Encounter (HOSPITAL_COMMUNITY): Payer: Self-pay | Admitting: Occupational Therapy

## 2022-08-08 DIAGNOSIS — M25611 Stiffness of right shoulder, not elsewhere classified: Secondary | ICD-10-CM

## 2022-08-08 DIAGNOSIS — R29898 Other symptoms and signs involving the musculoskeletal system: Secondary | ICD-10-CM | POA: Diagnosis not present

## 2022-08-08 DIAGNOSIS — M25511 Pain in right shoulder: Secondary | ICD-10-CM
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
## 2022-08-08 NOTE — Patient Instructions (Signed)

## 2022-08-10 ENCOUNTER — Encounter (HOSPITAL_COMMUNITY): Payer: Self-pay | Admitting: Occupational Therapy

## 2022-08-10 ENCOUNTER — Ambulatory Visit (HOSPITAL_COMMUNITY): Payer: Medicare Other | Admitting: Occupational Therapy

## 2022-08-10 DIAGNOSIS — M25611 Stiffness of right shoulder, not elsewhere classified: Secondary | ICD-10-CM

## 2022-08-10 DIAGNOSIS — M25511 Pain in right shoulder: Secondary | ICD-10-CM

## 2022-08-10 DIAGNOSIS — R29898 Other symptoms and signs involving the musculoskeletal system: Secondary | ICD-10-CM

## 2022-08-10 NOTE — Patient Instructions (Signed)
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
## 2022-08-15 ENCOUNTER — Encounter (HOSPITAL_COMMUNITY): Payer: Medicare Other | Admitting: Occupational Therapy

## 2022-08-17 ENCOUNTER — Encounter (HOSPITAL_COMMUNITY): Payer: Self-pay | Admitting: Occupational Therapy

## 2022-08-17 ENCOUNTER — Encounter: Payer: Self-pay | Admitting: Orthopedic Surgery

## 2022-08-17 ENCOUNTER — Ambulatory Visit (INDEPENDENT_AMBULATORY_CARE_PROVIDER_SITE_OTHER): Payer: Medicare Other | Admitting: Orthopedic Surgery

## 2022-08-17 ENCOUNTER — Ambulatory Visit (HOSPITAL_COMMUNITY): Payer: Medicare Other | Attending: Internal Medicine | Admitting: Occupational Therapy

## 2022-08-17 DIAGNOSIS — M25511 Pain in right shoulder: Secondary | ICD-10-CM | POA: Diagnosis present

## 2022-08-17 DIAGNOSIS — M25611 Stiffness of right shoulder, not elsewhere classified: Secondary | ICD-10-CM | POA: Diagnosis present

## 2022-08-17 DIAGNOSIS — R29898 Other symptoms and signs involving the musculoskeletal system: Secondary | ICD-10-CM | POA: Insufficient documentation

## 2022-08-17 DIAGNOSIS — M1711 Unilateral primary osteoarthritis, right knee: Secondary | ICD-10-CM

## 2022-08-17 DIAGNOSIS — G8929 Other chronic pain: Secondary | ICD-10-CM

## 2022-08-17 NOTE — Progress Notes (Signed)
FOLLOW UP   Encounter Diagnoses  Name Primary?   Chronic pain of right knee Yes   Primary osteoarthritis of right knee    Chief Complaint  Patient presents with   Knee Pain    Right/ still same    62 year old male status post right shoulder reconstruction in October presents with osteoarthritis of the right knee continued pain dysfunction increased pain with temperature changes such as cold weather  He is still interested in having his right total knee  Today he walks without support there is no limp he is in varus he has a small varus thrust he has a flexion contracture of less than 5 degrees his knee flexion arc is 110 degrees he has a stable knee with pseudolaxity with varus stress  The plan is to perform a right total knee when his shoulder is stable enough to have the surgery.  I will contact Dr. Marlou Sa and ask him when it is okay to do the surgery and then contact Mr. Schuld to schedule

## 2022-08-17 NOTE — Patient Instructions (Signed)
  Complete the following 2-3 a day. Hold for 10-15 seconds. Complete 5 sets for each.   1) SHOULDER - ISOMETRIC FLEXION  Gently push your fist forward into a wall with your elbow bent.    2) SHOULDER - ISOMETRIC EXTENSION  Gently push your a bent elbow back into a wall.    3) SHOULDER - ISOMETRIC INTERNAL ROTATION   Gently press your hand into a wall using the palm side of your hand.  Maintain a bent elbow the entire time.        4) SHOULDER - ISOMETRIC ADDUCTION  Gently push your elbow into the side of your body.   5) SHOULDER - ISOMETRIC ABDUCTION  Gently push your elbow out to the side into a wall with your elbow bent.
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
## 2022-09-01 ENCOUNTER — Ambulatory Visit: Payer: Medicare Other | Admitting: Orthopedic Surgery

## 2022-09-14 ENCOUNTER — Ambulatory Visit: Payer: Medicare Other | Admitting: Orthopedic Surgery

## 2022-10-09 ENCOUNTER — Encounter: Payer: Self-pay | Admitting: Orthopedic Surgery

## 2022-10-09 ENCOUNTER — Ambulatory Visit (INDEPENDENT_AMBULATORY_CARE_PROVIDER_SITE_OTHER): Payer: Medicare Other | Admitting: Orthopedic Surgery

## 2022-10-09 DIAGNOSIS — S46011D Strain of muscle(s) and tendon(s) of the rotator cuff of right shoulder, subsequent encounter: Secondary | ICD-10-CM

## 2022-10-09 DIAGNOSIS — M1711 Unilateral primary osteoarthritis, right knee: Secondary | ICD-10-CM | POA: Diagnosis not present
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
## 2022-10-10 ENCOUNTER — Telehealth: Payer: Self-pay | Admitting: Radiology

## 2022-10-10 NOTE — Telephone Encounter (Signed)
Patient called and ended up in Nancy's VM as they hit the option for medical records when they called [place] office today.  Asking for refill pain meds from Dr Aline Brochure.  Patient just saw Dr Marlou Sa, not sure needs refill, but wanted to make sure you all knew he called.

## 2022-11-02 ENCOUNTER — Telehealth: Payer: Self-pay | Admitting: Orthopedic Surgery

## 2022-11-02 NOTE — Telephone Encounter (Signed)
Received email from [facility] that patient is inquiring about forms. IC patient. Lmvm advised we have not received any forms.

## 2022-11-03 ENCOUNTER — Telehealth: Payer: Self-pay | Admitting: Orthopedic Surgery

## 2022-11-03 NOTE — Telephone Encounter (Signed)
Hh for what ???

## 2022-11-03 NOTE — Telephone Encounter (Signed)
Dr. Ruthe Mannan patient - Tyler Hart [phone] called and they are requesting referral papers to be filled out for him to have [place].  She would like a call when they are done, she'll come and pick them up.

## 2022-11-06 NOTE — Telephone Encounter (Signed)
I called the home health agency to advise no home health is needed for the patient, after repeating 3 times she voiced understanding, stated patient requested home health but she will let him know Dr Aline Brochure has not ordered for him

## 2023-03-08 ENCOUNTER — Emergency Department (HOSPITAL_COMMUNITY)
Admission: EM | Admit: 2023-03-08 | Discharge: 2023-03-08 | Disposition: A | Discharge status: Home / Self Care | Payer: Medicare Other | Attending: Emergency Medicine | Admitting: Emergency Medicine

## 2023-03-08 ENCOUNTER — Other Ambulatory Visit: Payer: Self-pay

## 2023-03-08 ENCOUNTER — Encounter (HOSPITAL_COMMUNITY): Payer: Self-pay | Admitting: Emergency Medicine

## 2023-03-08 DIAGNOSIS — M109 Gout, unspecified: Secondary | ICD-10-CM

## 2023-03-08 DIAGNOSIS — I10 Essential (primary) hypertension: Secondary | ICD-10-CM | POA: Diagnosis not present

## 2023-03-08 DIAGNOSIS — M10071 Idiopathic gout, right ankle and foot: Secondary | ICD-10-CM | POA: Insufficient documentation

## 2023-03-08 DIAGNOSIS — Z79899 Other long term (current) drug therapy: Secondary | ICD-10-CM | POA: Insufficient documentation

## 2023-03-08 DIAGNOSIS — Z7982 Long term (current) use of aspirin: Secondary | ICD-10-CM | POA: Insufficient documentation

## 2023-03-08 DIAGNOSIS — M25571 Pain in right ankle and joints of right foot: Secondary | ICD-10-CM | POA: Diagnosis present

## 2023-03-08 MED ORDER — DEXAMETHASONE SODIUM PHOSPHATE 10 MG/ML IJ SOLN
8.0000 mg | Freq: Once | INTRAMUSCULAR | Status: AC
  Administered 2023-03-08: 8 mg via INTRAMUSCULAR
  Filled 2023-03-08: qty 1

## 2023-03-08 NOTE — Discharge Instructions (Signed)
Follow-up with your primary care provider for recheck.  Return to the ER if needed.

## 2023-03-08 NOTE — ED Triage Notes (Signed)
Pt presents with right ankle swelling from Gout flare-up, per pt he missed taking his pills for a couple days.

## 2023-03-08 NOTE — ED Provider Notes (Signed)
[place] EMERGENCY DEPARTMENT AT [facility] Provider Note   CSN: [id number] Arrival date & time: 03/08/23  1300     History  Chief Complaint  Patient presents with   Gout Flare-Up    Tyler Hart is a 62 y.o. male.  HPI     Tyler Hart is a 62 y.o. male with past medical history of recurrent gout, hepatitis C, and hypertension who presents to the Emergency Department complaining of recurrent pain of right ankle.  Symptoms x 2 days.  States he missed taking his allopurinol for few days.  Denies injury, numbness or weakness foot.    Home Medications Prior to Admission medications   Medication Sig Start Date End Date Taking? Authorizing Provider  allopurinol (ZYLOPRIM) 300 MG tablet TAKE 1 AND 1/2 TABLETS(450 MG) BY MOUTH DAILY 01/09/22   Rice, Jamesetta Orleans, MD  aspirin EC 81 MG tablet Take 1 tablet (81 mg total) by mouth daily. Swallow whole. 05/22/22   Orbie Pyo, MD  atorvastatin (LIPITOR) 20 MG tablet Take 1 tablet (20 mg total) by mouth daily. 05/22/22   Orbie Pyo, MD  baclofen (LIORESAL) 10 MG tablet Take 10 mg by mouth 3 (three) times daily. 11/20/21   [provider]  Cholecalciferol (VITAMIN D3) 1.25 MG (50000 UT) CAPS Take 50,000 Units by mouth once a week. 01/26/22   [provider]  gabapentin (NEURONTIN) 300 MG capsule Take 1 capsule (300 mg total) by mouth 3 (three) times daily. 09/04/17   Vickki Hearing, MD  meloxicam (MOBIC) 15 MG tablet Take 15 mg by mouth daily. 05/01/22   [provider]  MITIGARE 0.6 MG CAPS Take 0.6 mg by mouth daily. 06/02/21   Rice, Jamesetta Orleans, MD  oxyCODONE (OXY IR/ROXICODONE) 5 MG immediate release tablet Take 2 tablets (10 mg total) by mouth every 6 (six) hours as needed for severe pain. 07/21/22   Cammy Copa, MD  oxyCODONE (OXYCONTIN) 15 mg 12 hr tablet Take 1 tablet (15 mg total) by mouth every 12 (twelve) hours. 05/26/22   Magnant, Joycie Peek, PA-C   oxyCODONE-acetaminophen (PERCOCET) 10-325 MG tablet Take 1 tablet by mouth in the morning, at noon, in the evening, and at bedtime. 07/04/21   [provider]      Allergies    Patient has no known allergies.    Review of Systems   Review of Systems  Constitutional:  Negative for chills and fever.  Respiratory:  Negative for shortness of breath.   Cardiovascular:  Negative for chest pain.  Gastrointestinal:  Negative for nausea and vomiting.  Musculoskeletal:  Positive for arthralgias (right ankle pain). Negative for joint swelling.  Skin:  Negative for color change, rash and wound.    Physical Exam Updated Vital Signs BP 119/86 (BP Location: Right Arm)   Pulse 79   Temp 98 F (36.7 C)   Resp 18   Ht 6\' 2"  (1.88 m)   Wt 111.1 kg   SpO2 98%   BMI 31.46 kg/m  Physical Exam Vitals and nursing note reviewed.  Constitutional:      General: He is not in acute distress.    Appearance: Normal appearance. He is not ill-appearing or toxic-appearing.  Cardiovascular:     Rate and Rhythm: Normal rate and regular rhythm.     Pulses: Normal pulses.  Pulmonary:     Effort: Pulmonary effort is normal.  Musculoskeletal:        General: Tenderness present. No swelling  or signs of injury.     Comments: Ttp medial aspect of the right ankle.  No significant erythema or excessive warmth.  Right foot non tender  Skin:    General: Skin is warm.     Capillary Refill: Capillary refill takes less than 2 seconds.     Findings: No erythema or rash.  Neurological:     General: No focal deficit present.     Mental Status: He is alert.     Sensory: No sensory deficit.     Motor: No weakness.     ED Results / Procedures / Treatments   Labs (all labs ordered are listed, but only abnormal results are displayed) Labs Reviewed - No data to display  EKG None  Radiology No results found.  Procedures Procedures    Medications Ordered in ED Medications  dexamethasone  (DECADRON) injection 8 mg (has no administration in time range)    ED Course/ Medical Decision Making/ A&P                             Medical Decision Making Pt here with recurrent right ankle pain and hx of gout.  No injury.  States current pain of his ankle feels similar to his gout flares of the past.  Sx's began after missing his allopurinol for few days.    Likely gout flare, septic joint, MSK and fx sx also considered but felt less likely.    Amount and/or Complexity of Data Reviewed Discussion of management or test interpretation with external provider(s): Doubt emergent process.  NV intact.  IM decadron given here.,database reviewed has pain medication at home,.    Risk Prescription drug management.           Final Clinical Impression(s) / ED Diagnoses Final diagnoses:  Acute gout of right ankle, unspecified cause    Rx / DC Orders ED Discharge Orders     None         Pauline Aus, PA-C 03/09/23 1730    Margarita Grizzle, MD 03/10/23 [phone]

## 2023-03-19 ENCOUNTER — Encounter: Payer: Self-pay | Admitting: Orthopedic Surgery

## 2023-03-19 ENCOUNTER — Ambulatory Visit (INDEPENDENT_AMBULATORY_CARE_PROVIDER_SITE_OTHER): Payer: Medicare Other | Admitting: Orthopedic Surgery

## 2023-03-19 DIAGNOSIS — M1711 Unilateral primary osteoarthritis, right knee: Secondary | ICD-10-CM

## 2023-03-19 DIAGNOSIS — M19211 Secondary osteoarthritis, right shoulder: Secondary | ICD-10-CM
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
## 2023-07-20 NOTE — Progress Notes (Unsigned)
Cardiology Office Note:   Date:  07/20/2023  ID:  Tyler Hart, DOB 60-09-1960, MRN [id number] PCP:  Benetta Spar, MD  [facility] Providers Cardiologist:  Alverda Skeans, MD Referring MD: Benetta Spar*  Chief Complaint/Reason for Referral: Cardiology follow-up ASSESSMENT:    1. Aortic atherosclerosis (HCC)   2. Hyperlipidemia LDL goal <70   3. Left anterior fascicular block   4. BMI 31.0-31.9,adult     PLAN:   In order of problems listed above: Aortic atherosclerosis: Continue aspirin and atorvastatin. Hyperlipidemia: Check lipid panel, LFTs, LP(a) today.*** Left anterior fascicular block: Monitor. Elevated BMI: Diet and exercise.        {Are you ordering a CV Procedure (e.g. stress test, cath, DCCV, TEE, etc)?   Press F2        :[id number]}   Dispo:  No follow-ups on file.      Medication Adjustments/Labs and Tests Ordered: Current medicines are reviewed at length with the patient today.  Concerns regarding medicines are outlined above.  The following changes have been made:  {PLAN; NO CHANGE:13088:s}   Labs/tests ordered: No orders of the defined types were placed in this encounter.   Medication Changes: No orders of the defined types were placed in this encounter.   Current medicines are reviewed at length with the patient today.  The patient {ACTIONS; HAS/DOES NOT HAVE:19233} concerns regarding medicines.  I spent *** minutes reviewing all clinical data during and prior to this visit including all relevant imaging studies, laboratories, clinical information from other health systems and prior notes from both Cardiology and other specialties, interviewing the patient, conducting a complete physical examination, and coordinating care in order to formulate a comprehensive and personalized evaluation and treatment plan.   History of Present Illness:      FOCUSED PROBLEM LIST:   Aortic atherosclerosis CT renal stone protocol  2022 Hyperlipidemia Hepatitis C Gout Arthritis Left anterior fascicular block BMI 31  10/23:  The patient is a 62 y.o. male with the indicated medical history here for recommendations regarding abnormal EKG in preparation for shoulder surgery.  The patient was seen for preoperative evaluation and an EKG was done which showed normal sinus rhythm and a left anterior fascicular block.   Today the patient is doing well.  He is able to do all of his activities of daily living other than lifting something heavy with his right arm.  He is able to walk around his house, go grocery shopping, and perform his activities of daily living without exertional angina, exertional dyspnea, presyncope, syncope, or palpitations.  He has required no emergency room visits or hospitalizations recently.  He is otherwise well without significant complaints.  Plan: Start aspirin 81 mg, atorvastatin 20 mg, check lipid panel, LFTs, LP(a).  12/24: In the interim the patient underwent orthopedic surgery without any issues.          Current Medications: No outpatient medications have been marked as taking for the 07/24/23 encounter (Appointment) with Orbie Pyo, MD.     Review of Systems:   Please see the history of present illness.    All other systems reviewed and are negative.     EKGs/Labs/Other Test Reviewed:   EKG: EKG performed October 60 demonstrates sinus rhythm with left anterior fascicular block  EKG Interpretation Date/Time:    Ventricular Rate:    PR Interval:    QRS Duration:    QT Interval:    QTC Calculation:   R Axis:  Text Interpretation:           Risk Assessment/Calculations:   {Does this patient have ATRIAL FIBRILLATION?:[id number]}      Physical Exam:   VS:  There were no vitals taken for this visit.   No BP recorded.  {Refresh Note OR Click here to enter BP  :1}***   Wt Readings from Last 3 Encounters:  03/08/23 245 lb (111.1 kg)  05/25/22 249 lb (112.9 kg)   05/22/22 249 lb (112.9 kg)      GENERAL:  No apparent distress, AOx3 HEENT:  No carotid bruits, +2 carotid impulses, no scleral icterus CAR: RRR Irregular RR*** no murmurs***, gallops, rubs, or thrills RES:  Clear to auscultation bilaterally ABD:  Soft, nontender, nondistended, positive bowel sounds x 4 VASC:  +2 radial pulses, +2 carotid pulses NEURO:  CN 2-12 grossly intact; motor and sensory grossly intact PSYCH:  No active depression or anxiety EXT:  No edema, ecchymosis, or cyanosis  Signed, Orbie Pyo, MD  07/20/2023 8:24 PM    [facility] [address] [place], [place], [place]  [address] Phone: [phone]; Fax: [phone]   Note:  This document was prepared using Dragon voice recognition software and may include unintentional dictation errors.

## 2023-07-24 ENCOUNTER — Ambulatory Visit: Payer: Medicare Other | Attending: Internal Medicine | Admitting: Internal Medicine

## 2023-07-24 DIAGNOSIS — Z6831 Body mass index (BMI) 31.0-31.9, adult: Secondary | ICD-10-CM

## 2023-07-24 DIAGNOSIS — I700 Atherosclerosis of aorta: Secondary | ICD-10-CM

## 2023-07-24 DIAGNOSIS — E785 Hyperlipidemia, unspecified: Secondary | ICD-10-CM

## 2023-07-24 DIAGNOSIS — I444 Left anterior fascicular block: Secondary | ICD-10-CM

## 2023-07-25 ENCOUNTER — Encounter: Payer: Self-pay | Admitting: Internal Medicine

## 2024-04-07 ENCOUNTER — Ambulatory Visit (HOSPITAL_COMMUNITY): Attending: Nurse Practitioner | Admitting: Occupational Therapy

## 2024-04-07 ENCOUNTER — Encounter (HOSPITAL_COMMUNITY): Payer: Self-pay | Admitting: Occupational Therapy

## 2024-04-07 DIAGNOSIS — M25611 Stiffness of right shoulder, not elsewhere classified: Secondary | ICD-10-CM | POA: Diagnosis present

## 2024-04-07 DIAGNOSIS — M25511 Pain in right shoulder: Secondary | ICD-10-CM | POA: Insufficient documentation

## 2024-04-07 DIAGNOSIS — R29898 Other symptoms and signs involving the musculoskeletal system: Secondary | ICD-10-CM | POA: Diagnosis present
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
## 2024-04-07 NOTE — Patient Instructions (Signed)
 Perform each exercise ____10-15____ reps. 2-3x days.   1) Protraction   Start by holding a wand or cane at chest height.  Next, slowly push the wand outwards in front of your body so that your elbows become fully straightened. Then, return to the original position.     2) Shoulder FLEXION   In the standing position, hold a wand/cane with both arms, palms down on both sides. Raise up the wand/cane allowing your unaffected arm to perform most of the effort. Your affected arm should be partially relaxed.      3) Internal/External ROTATION   In the standing position, hold a wand/cane with both hands keeping your elbows bent. Move your arms and wand/cane to one side.  Your affected arm should be partially relaxed while your unaffected arm performs most of the effort.       4) Shoulder ABDUCTION   While holding a wand/cane palm face up on the injured side and palm face down on the uninjured side, slowly raise up your injured arm to the side.        5) Horizontal Abduction/Adduction      Straight arms holding cane at shoulder height, bring cane to right, center, left. Repeat starting to left.   Copyright  VHI. All rights reserved.       Complete the following 2x a day. Hold for 15 seconds. Complete 5 sets for each.   1) SHOULDER - ISOMETRIC FLEXION  Gently push your fist forward into a wall with your elbow bent.    2) SHOULDER - ISOMETRIC EXTENSION  Gently push your a bent elbow back into a wall.    3) SHOULDER - ISOMETRIC INTERNAL ROTATION   Gently press your hand into a wall using the palm side of your hand.  Maintain a bent elbow the entire time.        4) SHOULDER - ISOMETRIC ADDUCTION  Gently push your elbow into the side of your body.   5) SHOULDER - ISOMETRIC ABDUCTION  Gently push your elbow out to the side into a wall with your elbow bent.

## 2024-06-21 ENCOUNTER — Encounter (HOSPITAL_COMMUNITY): Payer: Self-pay | Admitting: *Deleted

## 2024-06-21 ENCOUNTER — Other Ambulatory Visit: Payer: Self-pay

## 2024-06-21 ENCOUNTER — Emergency Department (HOSPITAL_COMMUNITY): Admission: EM | Admit: 2024-06-21 | Discharge: 2024-06-21 | Disposition: A | Discharge status: Home / Self Care

## 2024-06-21 DIAGNOSIS — M79641 Pain in right hand: Secondary | ICD-10-CM | POA: Diagnosis present

## 2024-06-21 DIAGNOSIS — M109 Gout, unspecified: Secondary | ICD-10-CM | POA: Diagnosis not present

## 2024-06-21 DIAGNOSIS — Z7982 Long term (current) use of aspirin: Secondary | ICD-10-CM | POA: Insufficient documentation

## 2024-06-21 MED ORDER — DEXAMETHASONE SOD PHOSPHATE PF 10 MG/ML IJ SOLN
10.0000 mg | Freq: Once | INTRAMUSCULAR | Status: AC
  Administered 2024-06-21: 10 mg via INTRAMUSCULAR

## 2024-06-21 MED ORDER — PREDNISONE 10 MG PO TABS: 30.0000 mg | ORAL_TABLET | Freq: Every day | ORAL | 0 refills | Status: AC

## 2024-06-21 NOTE — ED Triage Notes (Signed)
 Pt with right hand pain, woke up with it and denies any injury. Pt with hx of gout.

## 2024-06-21 NOTE — Discharge Instructions (Signed)
 Evaluation today is revealing of likely gout flare.  You were treated with one-time dose of Decadron  here and I sent a few tablets of prednisone  to your pharmacy.  Please follow-up with your PCP.  If your symptoms return or you have redness in your hand or develop a fever or any other concerning symptom please return to the ED for further evaluation.

## 2024-06-21 NOTE — ED Provider Notes (Cosign Needed Addendum)
 [place] EMERGENCY DEPARTMENT AT [facility] Provider Note   CSN: [id number] Arrival date & time: 06/21/24  1217     Patient presents with: Hand Pain  HPI Tyler Hart is a 63 y.o. male presenting for concern for gout flare.  He states he woke up with right hand pain particularly at the base of the index finger and radiates about the radial aspect of the hand.  He states he has had gout in this hand multiple times.  He woke up with this pain.  Denies any trauma.  Denies fever.  Also reports that he has been taking his allopurinol  as prescribed and denies excessive alcohol or red meat consumption.    Hand Pain       Prior to Admission medications   Medication Sig Start Date End Date Taking? Authorizing Provider  predniSONE  (DELTASONE ) 10 MG tablet Take 3 tablets (30 mg total) by mouth daily for 3 days. 06/21/24 06/24/24 Yes Leianne Callins K, PA-C  allopurinol  (ZYLOPRIM ) 300 MG tablet TAKE 1 AND 1/2 TABLETS(450 MG) BY MOUTH DAILY 01/09/22   Rice, Lonni ORN, MD  aspirin  EC 81 MG tablet Take 1 tablet (81 mg total) by mouth daily. Swallow whole. 05/22/22   Thukkani, Arun K, MD  atorvastatin  (LIPITOR) 20 MG tablet Take 1 tablet (20 mg total) by mouth daily. 05/22/22   Thukkani, Arun K, MD  baclofen  (LIORESAL ) 10 MG tablet Take 10 mg by mouth 3 (three) times daily. 11/20/21   [provider]  Cholecalciferol (VITAMIN D3) 1.25 MG (50000 UT) CAPS Take 50,000 Units by mouth once a week. 01/26/22   [provider]  gabapentin  (NEURONTIN ) 300 MG capsule Take 1 capsule (300 mg total) by mouth 3 (three) times daily. 09/04/17   Margrette Taft BRAVO, MD  meloxicam (MOBIC) 15 MG tablet Take 15 mg by mouth daily. 05/01/22   [provider]  MITIGARE  0.6 MG CAPS Take 0.6 mg by mouth daily. 06/02/21   Rice, Lonni ORN, MD  oxyCODONE  (OXY IR/ROXICODONE ) 5 MG immediate release tablet Take 2 tablets (10 mg total) by mouth every 6 (six) hours as needed for severe  pain. 07/21/22   Addie Cordella Hamilton, MD  oxyCODONE  (OXYCONTIN ) 15 mg 12 hr tablet Take 1 tablet (15 mg total) by mouth every 12 (twelve) hours. 05/26/22   Magnant, Charles L, PA-C  oxyCODONE -acetaminophen  (PERCOCET) 10-325 MG tablet Take 1 tablet by mouth in the morning, at noon, in the evening, and at bedtime. 07/04/21   [provider]    Allergies: Patient has no known allergies.    Review of Systems See HPI  Updated Vital Signs BP 127/82 (BP Location: Left Arm)   Pulse 69   Temp 98.4 F (36.9 C)   Resp 16   Ht 6' 2 (1.88 m)   Wt 108.9 kg   SpO2 97%   BMI 30.81 kg/m   Physical Exam Constitutional:      Appearance: Normal appearance.  HENT:     Head: Normocephalic.     Nose: Nose normal.  Eyes:     Conjunctiva/sclera: Conjunctivae normal.  Pulmonary:     Effort: Pulmonary effort is normal.  Musculoskeletal:     Comments: Swelling noted overlying the MCP joint of the index finger on the right hand.  Not hot to touch but tender.  No erythema noted.  Radial pulses 2+.  Cap refill brisk distally. Sensation intact to light touch distally as well.  Neurological:     Mental Status:  He is alert.  Psychiatric:        Mood and Affect: Mood normal.     (all labs ordered are listed, but only abnormal results are displayed) Labs Reviewed - No data to display  EKG: None  Radiology: No results found.   Procedures   Medications Ordered in the ED  dexamethasone  (DECADRON ) injection 10 mg (has no administration in time range)                                    Medical Decision Making Risk Prescription drug management.   63 year old male presenting for right hand pain.  Exam findings and symptoms consistent with likely gout flare.  Reviewed his chart and he was here in July and received 1 IM injection of Decadron  and he reported that that worked very well last time.  I went ahead and gave him IM Decadron  here and sent a 3-day course of steroids to his  pharmacy.  Advised him to hold his allopurinol  until his symptoms resolved and to resume the medicine. Advised him to follow-up with his PCP.  No suggestions on exam to suggest traumatic injury or infection.  Discharged.     Final diagnoses:  Gout, unspecified cause, unspecified chronicity, unspecified site    ED Discharge Orders          Ordered    predniSONE  (DELTASONE ) 10 MG tablet  Daily        06/21/24 1329              Isabelle Matt K, PA-C 06/21/24 1352    Kammerer, Megan L, DO 06/22/24 [phone]

## 2024-06-30 ENCOUNTER — Emergency Department (HOSPITAL_COMMUNITY)
Admission: EM | Admit: 2024-06-30 | Discharge: 2024-06-30 | Disposition: A | Discharge status: Home / Self Care | Attending: Emergency Medicine | Admitting: Emergency Medicine

## 2024-06-30 ENCOUNTER — Emergency Department (HOSPITAL_COMMUNITY)

## 2024-06-30 ENCOUNTER — Encounter (HOSPITAL_COMMUNITY): Payer: Self-pay | Admitting: Emergency Medicine

## 2024-06-30 ENCOUNTER — Other Ambulatory Visit: Payer: Self-pay

## 2024-06-30 DIAGNOSIS — I10 Essential (primary) hypertension: Secondary | ICD-10-CM | POA: Diagnosis not present

## 2024-06-30 DIAGNOSIS — R509 Fever, unspecified: Secondary | ICD-10-CM | POA: Diagnosis not present

## 2024-06-30 DIAGNOSIS — Z7982 Long term (current) use of aspirin: Secondary | ICD-10-CM | POA: Insufficient documentation

## 2024-06-30 DIAGNOSIS — M109 Gout, unspecified: Secondary | ICD-10-CM | POA: Insufficient documentation

## 2024-06-30 DIAGNOSIS — D72829 Elevated white blood cell count, unspecified: Secondary | ICD-10-CM | POA: Insufficient documentation

## 2024-06-30 DIAGNOSIS — M79645 Pain in left finger(s): Secondary | ICD-10-CM | POA: Diagnosis present

## 2024-06-30 DIAGNOSIS — Z79899 Other long term (current) drug therapy: Secondary | ICD-10-CM | POA: Insufficient documentation

## 2024-06-30 LAB — BASIC METABOLIC PANEL WITH GFR
Anion gap: 11 (ref 5–15)
BUN: 10 mg/dL (ref 8–23)
CO2: 26 mmol/L (ref 22–32)
Calcium: 9.8 mg/dL (ref 8.9–10.3)
Chloride: 102 mmol/L (ref 98–111)
Creatinine, Ser: 0.92 mg/dL (ref 0.61–1.24)
GFR, Estimated: >60 mL/min (ref >60)
Glucose, Bld: 88 mg/dL (ref 70–99)
Potassium: 4.5 mmol/L (ref 3.5–5.1)
Sodium: 139 mmol/L (ref 135–145)

## 2024-06-30 LAB — RESP PANEL BY RT-PCR (RSV, FLU A&B, COVID)  RVPGX2
Influenza A by PCR: NEGATIVE
Influenza B by PCR: NEGATIVE
Resp Syncytial Virus by PCR: NEGATIVE
SARS Coronavirus 2 by RT PCR: NEGATIVE

## 2024-06-30 LAB — CBC WITH DIFFERENTIAL/PLATELET
Abs Immature Granulocytes: 0.06 K/uL (ref 0.00–0.07)
Basophils Absolute: 0.1 K/uL (ref 0.0–0.1)
Basophils Relative: 1 %
Eosinophils Absolute: 0 K/uL (ref 0.0–0.5)
Eosinophils Relative: 0 %
HCT: 41.6 % (ref 39.0–52.0)
Hemoglobin: 13.9 g/dL (ref 13.0–17.0)
Immature Granulocytes: 1 %
Lymphocytes Relative: 20 %
Lymphs Abs: 2.4 K/uL (ref 0.7–4.0)
MCH: 30.5 pg (ref 26.0–34.0)
MCHC: 33.4 g/dL (ref 30.0–36.0)
MCV: 91.4 fL (ref 80.0–100.0)
Monocytes Absolute: 1.3 K/uL — ABNORMAL HIGH (ref 0.1–1.0)
Monocytes Relative: 11 %
Neutro Abs: 8.2 K/uL — ABNORMAL HIGH (ref 1.7–7.7)
Neutrophils Relative %: 67 %
Platelets: 259 K/uL (ref 150–400)
RBC: 4.55 MIL/uL (ref 4.22–5.81)
RDW: 13.8 % (ref 11.5–15.5)
WBC: 12.1 K/uL — ABNORMAL HIGH (ref 4.0–10.5)
nRBC: 0 % (ref 0.0–0.2)

## 2024-06-30 MED ORDER — PREDNISONE 50 MG PO TABS: 50.0000 mg | ORAL_TABLET | Freq: Every day | ORAL | 0 refills | Status: AC

## 2024-06-30 MED ORDER — HYDROCODONE-ACETAMINOPHEN 5-325 MG PO TABS: 1.0000 | ORAL_TABLET | ORAL | 0 refills | Status: AC | PRN

## 2024-06-30 MED ORDER — DEXAMETHASONE SOD PHOSPHATE PF 10 MG/ML IJ SOLN
10.0000 mg | Freq: Once | INTRAMUSCULAR | Status: AC
  Administered 2024-06-30: 10 mg via INTRAVENOUS

## 2024-06-30 MED ORDER — ACETAMINOPHEN 500 MG PO TABS
1000.0000 mg | ORAL_TABLET | Freq: Once | ORAL | Status: AC
  Administered 2024-06-30: 1000 mg via ORAL
  Filled 2024-06-30: qty 2

## 2024-06-30 MED ORDER — KETOROLAC TROMETHAMINE 30 MG/ML IJ SOLN
30.0000 mg | Freq: Once | INTRAMUSCULAR | Status: AC
  Administered 2024-06-30: 30 mg via INTRAVENOUS
  Filled 2024-06-30: qty 1

## 2024-06-30 MED ORDER — IBUPROFEN 600 MG PO TABS: 600.0000 mg | ORAL_TABLET | Freq: Four times a day (QID) | ORAL | 0 refills | Status: AC | PRN

## 2024-06-30 NOTE — ED Provider Notes (Signed)
 [place] EMERGENCY DEPARTMENT AT [facility] Provider Note   CSN: [id number] Arrival date & time: 06/30/24  1033     Patient presents with: Gout   Tyler Hart is a 63 y.o. male.   Pt is a 63 yo male with pmhx significant for gout, hep c, arthritis, and htn.  Pt has had several episodes of gout in various joints lately.  Most recently, he had gout in his right hand.  He was seen in the ED on 11/8 for that.  Pt was d/c with prednisone .  He said he has pain in his left thumb today.  He also has some aches.  He has a low grade fever here which he did not know he had.  No cough.  No urinary sx.  Pt does not drink anymore.  He does eat a lot of red meat.       Prior to Admission medications   Medication Sig Start Date End Date Taking? Authorizing Provider  HYDROcodone -acetaminophen  (NORCO/VICODIN) 5-325 MG tablet Take 1 tablet by mouth every 4 (four) hours as needed. 06/30/24  Yes Dean Clarity, MD  ibuprofen  (ADVIL ) 600 MG tablet Take 1 tablet (600 mg total) by mouth every 6 (six) hours as needed. 06/30/24  Yes Dean Clarity, MD  predniSONE  (DELTASONE ) 50 MG tablet Take 1 tablet (50 mg total) by mouth daily with breakfast. 06/30/24  Yes Dean Clarity, MD  allopurinol  (ZYLOPRIM ) 300 MG tablet TAKE 1 AND 1/2 TABLETS(450 MG) BY MOUTH DAILY 01/09/22   Rice, Lonni ORN, MD  aspirin  EC 81 MG tablet Take 1 tablet (81 mg total) by mouth daily. Swallow whole. 05/22/22   Thukkani, Arun K, MD  atorvastatin  (LIPITOR) 20 MG tablet Take 1 tablet (20 mg total) by mouth daily. 05/22/22   Thukkani, Arun K, MD  baclofen  (LIORESAL ) 10 MG tablet Take 10 mg by mouth 3 (three) times daily. 11/20/21   [provider]  Cholecalciferol (VITAMIN D3) 1.25 MG (50000 UT) CAPS Take 50,000 Units by mouth once a week. 01/26/22   [provider]  gabapentin  (NEURONTIN ) 300 MG capsule Take 1 capsule (300 mg total) by mouth 3 (three) times daily. 09/04/17   Margrette Taft BRAVO, MD   meloxicam (MOBIC) 15 MG tablet Take 15 mg by mouth daily. 05/01/22   [provider]  MITIGARE  0.6 MG CAPS Take 0.6 mg by mouth daily. 06/02/21   Jeannetta Lonni ORN, MD    Allergies: Patient has no known allergies.    Review of Systems  Musculoskeletal:        Left left thumb pain  All other systems reviewed and are negative.   Updated Vital Signs BP 131/89   Pulse 86   Temp (!) 100.5 F (38.1 C) (Oral)   Resp 20   Ht 6' 2 (1.88 m)   Wt 108.9 kg   SpO2 99%   BMI 30.81 kg/m   Physical Exam Vitals and nursing note reviewed.  Constitutional:      Appearance: Normal appearance.  HENT:     Head: Normocephalic and atraumatic.     Right Ear: External ear normal.     Left Ear: External ear normal.     Nose: Nose normal.     Mouth/Throat:     Mouth: Mucous membranes are moist.     Pharynx: Oropharynx is clear.  Eyes:     Extraocular Movements: Extraocular movements intact.     Conjunctiva/sclera: Conjunctivae normal.     Pupils: Pupils are equal,  round, and reactive to light.  Cardiovascular:     Rate and Rhythm: Normal rate and regular rhythm.     Pulses: Normal pulses.     Heart sounds: Normal heart sounds.  Pulmonary:     Effort: Pulmonary effort is normal.     Breath sounds: Normal breath sounds.  Abdominal:     General: Abdomen is flat. Bowel sounds are normal.     Palpations: Abdomen is soft.  Musculoskeletal:     Cervical back: Normal range of motion and neck supple.     Comments: Left mcp joint painful.  No redness or swelling indicating a septic joint.  Skin:    General: Skin is warm.     Capillary Refill: Capillary refill takes less than 2 seconds.  Neurological:     General: No focal deficit present.     Mental Status: He is alert and oriented to person, place, and time.  Psychiatric:        Mood and Affect: Mood normal.        Behavior: Behavior normal.     (all labs ordered are listed, but only abnormal results are displayed) Labs  Reviewed  CBC WITH DIFFERENTIAL/PLATELET - Abnormal; Notable for the following components:      Result Value   WBC 12.1 (*)    Neutro Abs 8.2 (*)    Monocytes Absolute 1.3 (*)    All other components within normal limits  RESP PANEL BY RT-PCR (RSV, FLU A&B, COVID)  [id number]  BASIC METABOLIC PANEL WITH GFR  URINALYSIS, ROUTINE W REFLEX MICROSCOPIC    EKG: None  Radiology: DG Chest 2 View Result Date: 06/30/2024 EXAM: 2 VIEW(S) XRAY OF THE CHEST 06/30/2024 01:41:00 PM COMPARISON: 07/14/2021 CLINICAL HISTORY: fever FINDINGS: LUNGS AND PLEURA: No focal pulmonary opacity. No pleural effusion. No pneumothorax. HEART AND MEDIASTINUM: No acute abnormality of the cardiac and mediastinal silhouettes. 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 No acute osseous abnormality. IMPRESSION: 1. No acute cardiopulmonary process. Electronically signed by: Waddell Calk MD 06/30/2024 02:36 PM EST RP Workstation: [id number]     Procedures   Medications Ordered in the ED  ketorolac  (TORADOL ) 30 MG/ML injection 30 mg (30 mg Intravenous Given 06/30/24 1355)  dexamethasone  (DECADRON ) injection 10 mg (10 mg Intravenous Given 06/30/24 1355)  acetaminophen  (TYLENOL ) tablet 1,000 mg (1,000 mg Oral Given 06/30/24 1357)                                    Medical Decision Making Amount and/or Complexity of Data Reviewed Labs: ordered. Radiology: ordered.  Risk OTC drugs. Prescription drug management.   This patient presents to the ED for concern of gout flare, this involves an extensive number of treatment options, and is a complaint that carries with it a high risk of complications and morbidity.  The differential diagnosis includes gout, septic   Co morbidities that complicate the patient evaluation  gout, hep c, arthritis, and htn   Additional history obtained:  Additional history obtained from epic chart review    Lab Tests:  I Ordered, and  personally interpreted labs.  The pertinent results include:  cbc with wbc sl elevated at 12.1; bmp nl; covid/flu/rsv neg   Imaging Studies ordered:  I ordered imaging studies including cxr  I independently visualized and interpreted imaging which showed No acute cardiopulmonary process.  I agree with the radiologist interpretation  Medicines ordered and prescription drug management:  I ordered medication including decadron /toradol   for sx  Reevaluation of the patient after these medicines showed that the patient improved I have reviewed the patients home medicines and have made adjustments as needed  Problem List / ED Course:  Gout:  pt is feeling better after treatment.  He is d/c with prednisone , lortab, and ibuprofen .   Fever:  doubt septic joint.  Pt did not want to wait for ua.  He has no urinary sx.  He likely has a viral syn.  He is stable for d/c.  Return if worse.    Reevaluation:  After the interventions noted above, I reevaluated the patient and found that they have :improved   Social Determinants of Health:  Lives at home   Dispostion:  After consideration of the diagnostic results and the patients response to treatment, I feel that the patent would benefit from discharge with outpatient f/u.       Final diagnoses:  Acute gout of left hand, unspecified cause    ED Discharge Orders          Ordered    predniSONE  (DELTASONE ) 50 MG tablet  Daily with breakfast        06/30/24 1459    HYDROcodone -acetaminophen  (NORCO/VICODIN) 5-325 MG tablet  Every 4 hours PRN       Note to Pharmacy: ICD Code:  M10.9   06/30/24 1540    ibuprofen  (ADVIL ) 600 MG tablet  Every 6 hours PRN        06/30/24 1540               Brysan Mcevoy, MD 06/30/24 1541

## 2024-06-30 NOTE — ED Triage Notes (Signed)
 Pt c/o gout with hx of same, pain to left side
